# Patient Record
Sex: Female | Born: 1940 | Race: Black or African American | Hispanic: No | Smoking: Former smoker
Health system: Southern US, Community
[De-identification: ages and names within clinical notes are randomized; demographics above are authoritative.]

## PROBLEM LIST (undated history)

## (undated) DIAGNOSIS — I739 Peripheral vascular disease, unspecified: Secondary | ICD-10-CM

## (undated) DIAGNOSIS — Z951 Presence of aortocoronary bypass graft: Secondary | ICD-10-CM

## (undated) DIAGNOSIS — I4891 Unspecified atrial fibrillation: Secondary | ICD-10-CM

## (undated) DIAGNOSIS — E785 Hyperlipidemia, unspecified: Secondary | ICD-10-CM

## (undated) DIAGNOSIS — E119 Type 2 diabetes mellitus without complications: Secondary | ICD-10-CM

## (undated) DIAGNOSIS — M810 Age-related osteoporosis without current pathological fracture: Secondary | ICD-10-CM

## (undated) DIAGNOSIS — N186 End stage renal disease: Secondary | ICD-10-CM

## (undated) DIAGNOSIS — I340 Nonrheumatic mitral (valve) insufficiency: Secondary | ICD-10-CM

## (undated) DIAGNOSIS — I10 Essential (primary) hypertension: Secondary | ICD-10-CM

## (undated) DIAGNOSIS — Z9289 Personal history of other medical treatment: Secondary | ICD-10-CM

## (undated) DIAGNOSIS — I2510 Atherosclerotic heart disease of native coronary artery without angina pectoris: Secondary | ICD-10-CM

## (undated) HISTORY — DX: Hyperlipidemia, unspecified: E78.5

## (undated) HISTORY — DX: Presence of aortocoronary bypass graft: Z95.1

## (undated) HISTORY — DX: Peripheral vascular disease, unspecified: I73.9

## (undated) HISTORY — DX: Nonrheumatic mitral (valve) insufficiency: I34.0

## (undated) HISTORY — DX: Personal history of other medical treatment: Z92.89

## (undated) HISTORY — DX: Type 2 diabetes mellitus without complications: E11.9

## (undated) HISTORY — DX: End stage renal disease: N18.6

## (undated) HISTORY — DX: Unspecified atrial fibrillation: I48.91

## (undated) HISTORY — DX: Essential (primary) hypertension: I10

## (undated) HISTORY — DX: Age-related osteoporosis without current pathological fracture: M81.0

## (undated) HISTORY — DX: Atherosclerotic heart disease of native coronary artery without angina pectoris: I25.10

---

## 1998-06-11 ENCOUNTER — Other Ambulatory Visit: Admission: RE | Admit: 1998-06-11 | Discharge: 1998-06-11 | Payer: Self-pay | Admitting: Internal Medicine

## 2000-06-22 ENCOUNTER — Encounter: Payer: Self-pay | Admitting: Internal Medicine

## 2000-06-22 ENCOUNTER — Encounter: Admission: RE | Admit: 2000-06-22 | Discharge: 2000-06-22 | Payer: Self-pay | Admitting: Internal Medicine

## 2000-12-22 ENCOUNTER — Emergency Department (HOSPITAL_COMMUNITY): Admission: EM | Admit: 2000-12-22 | Discharge: 2000-12-22 | Payer: Self-pay | Admitting: Emergency Medicine

## 2001-06-29 ENCOUNTER — Encounter: Admission: RE | Admit: 2001-06-29 | Discharge: 2001-06-29 | Payer: Self-pay | Admitting: Internal Medicine

## 2001-06-29 ENCOUNTER — Encounter: Payer: Self-pay | Admitting: Internal Medicine

## 2002-07-03 ENCOUNTER — Encounter: Payer: Self-pay | Admitting: Internal Medicine

## 2002-07-03 ENCOUNTER — Encounter: Admission: RE | Admit: 2002-07-03 | Discharge: 2002-07-03 | Payer: Self-pay | Admitting: Internal Medicine

## 2003-07-17 ENCOUNTER — Encounter: Payer: Self-pay | Admitting: Internal Medicine

## 2003-07-17 ENCOUNTER — Encounter: Admission: RE | Admit: 2003-07-17 | Discharge: 2003-07-17 | Payer: Self-pay | Admitting: Internal Medicine

## 2004-09-13 ENCOUNTER — Encounter: Admission: RE | Admit: 2004-09-13 | Discharge: 2004-09-13 | Payer: Self-pay | Admitting: Internal Medicine

## 2005-10-20 ENCOUNTER — Encounter: Admission: RE | Admit: 2005-10-20 | Discharge: 2005-10-20 | Payer: Self-pay | Admitting: Internal Medicine

## 2005-11-14 HISTORY — PX: AV FISTULA PLACEMENT: SHX1204

## 2005-12-19 HISTORY — PX: CARDIAC CATHETERIZATION: SHX172

## 2006-01-18 ENCOUNTER — Inpatient Hospital Stay (HOSPITAL_COMMUNITY): Admission: RE | Admit: 2006-01-18 | Discharge: 2006-02-06 | Payer: Self-pay | Admitting: Surgery

## 2006-01-18 DIAGNOSIS — Z951 Presence of aortocoronary bypass graft: Secondary | ICD-10-CM

## 2006-01-18 HISTORY — PX: CORONARY ARTERY BYPASS GRAFT: SHX141

## 2006-01-18 HISTORY — DX: Presence of aortocoronary bypass graft: Z95.1

## 2006-01-23 ENCOUNTER — Encounter (INDEPENDENT_AMBULATORY_CARE_PROVIDER_SITE_OTHER): Payer: Self-pay | Admitting: *Deleted

## 2006-02-06 ENCOUNTER — Ambulatory Visit: Payer: Self-pay | Admitting: Physical Medicine & Rehabilitation

## 2006-02-06 ENCOUNTER — Inpatient Hospital Stay (HOSPITAL_COMMUNITY)
Admission: RE | Admit: 2006-02-06 | Discharge: 2006-02-15 | Payer: Self-pay | Admitting: Physical Medicine & Rehabilitation

## 2006-03-03 ENCOUNTER — Ambulatory Visit (HOSPITAL_COMMUNITY)
Admission: RE | Admit: 2006-03-03 | Discharge: 2006-03-03 | Payer: Self-pay | Admitting: Physical Medicine & Rehabilitation

## 2006-03-03 ENCOUNTER — Ambulatory Visit
Admission: RE | Admit: 2006-03-03 | Discharge: 2006-03-03 | Payer: Self-pay | Admitting: Physical Medicine & Rehabilitation

## 2006-03-14 ENCOUNTER — Encounter: Admission: RE | Admit: 2006-03-14 | Discharge: 2006-03-14 | Payer: Self-pay | Admitting: Surgery

## 2006-03-28 ENCOUNTER — Encounter
Admission: RE | Admit: 2006-03-28 | Discharge: 2006-06-26 | Payer: Self-pay | Admitting: Physical Medicine & Rehabilitation

## 2006-03-28 ENCOUNTER — Ambulatory Visit: Payer: Self-pay | Admitting: Physical Medicine & Rehabilitation

## 2006-03-30 ENCOUNTER — Encounter (HOSPITAL_COMMUNITY): Admission: RE | Admit: 2006-03-30 | Discharge: 2006-06-28 | Payer: Self-pay | Admitting: Cardiovascular Disease

## 2006-05-10 ENCOUNTER — Ambulatory Visit (HOSPITAL_COMMUNITY): Admission: RE | Admit: 2006-05-10 | Discharge: 2006-05-10 | Payer: Self-pay | Admitting: Vascular Surgery

## 2006-06-20 ENCOUNTER — Encounter: Admission: RE | Admit: 2006-06-20 | Discharge: 2006-07-31 | Payer: Self-pay | Admitting: Internal Medicine

## 2006-06-29 ENCOUNTER — Encounter (HOSPITAL_COMMUNITY): Admission: RE | Admit: 2006-06-29 | Discharge: 2006-08-11 | Payer: Self-pay | Admitting: Cardiovascular Disease

## 2006-08-01 ENCOUNTER — Encounter: Admission: RE | Admit: 2006-08-01 | Discharge: 2006-08-31 | Payer: Self-pay | Admitting: Internal Medicine

## 2006-08-28 ENCOUNTER — Encounter (HOSPITAL_COMMUNITY): Admission: RE | Admit: 2006-08-28 | Discharge: 2006-11-26 | Payer: Self-pay | Admitting: Nephrology

## 2006-10-31 ENCOUNTER — Encounter: Admission: RE | Admit: 2006-10-31 | Discharge: 2006-10-31 | Payer: Self-pay | Admitting: Internal Medicine

## 2006-12-22 ENCOUNTER — Encounter (HOSPITAL_COMMUNITY): Admission: RE | Admit: 2006-12-22 | Discharge: 2007-03-22 | Payer: Self-pay

## 2007-04-13 ENCOUNTER — Encounter (HOSPITAL_COMMUNITY): Admission: RE | Admit: 2007-04-13 | Discharge: 2007-07-12 | Payer: Self-pay | Admitting: Nephrology

## 2007-07-19 ENCOUNTER — Encounter (HOSPITAL_COMMUNITY): Admission: RE | Admit: 2007-07-19 | Discharge: 2007-10-17 | Payer: Self-pay | Admitting: Nephrology

## 2007-11-05 ENCOUNTER — Encounter: Admission: RE | Admit: 2007-11-05 | Discharge: 2007-11-05 | Payer: Self-pay | Admitting: Internal Medicine

## 2007-12-07 ENCOUNTER — Encounter (HOSPITAL_COMMUNITY): Admission: RE | Admit: 2007-12-07 | Discharge: 2008-03-06 | Payer: Self-pay | Admitting: Nephrology

## 2007-12-24 ENCOUNTER — Ambulatory Visit: Payer: Self-pay | Admitting: Surgery

## 2008-01-04 IMAGING — CT CT HEAD W/O CM
1 of 2 series · 13 of 30 positions shown, 17 images · IV contrast (agent unspecified)
Comparison: 01/20/2006.

CLINICAL DATA: Stroke.  
 HEAD CT WITHOUT CONTRAST:
TECHNIQUE: Contiguous axial images were obtained from the base of the skull through the vertex according to standard protocol without contrast.

[Series 2: brain · axial · 0.47mm/px · z∈[+129,+253]mm · 13 of 28 slices shown, 17 images]
[im 2/28  brain]
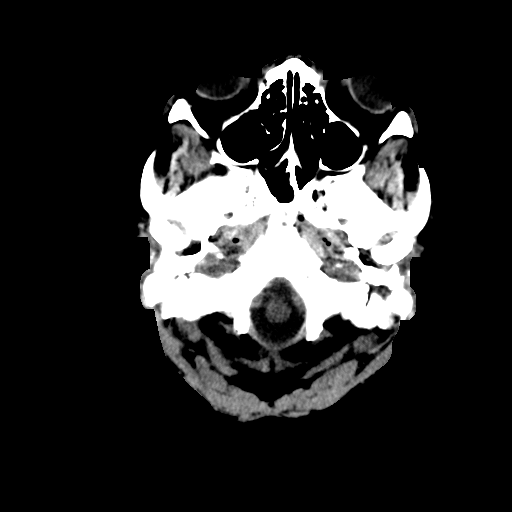
[im 2/28  bone]
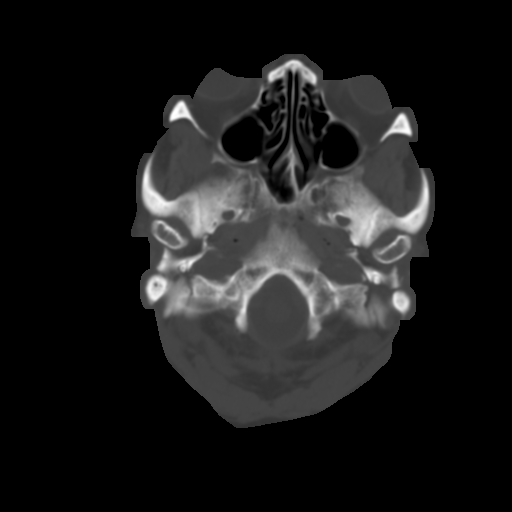
[im 4/28  brain]
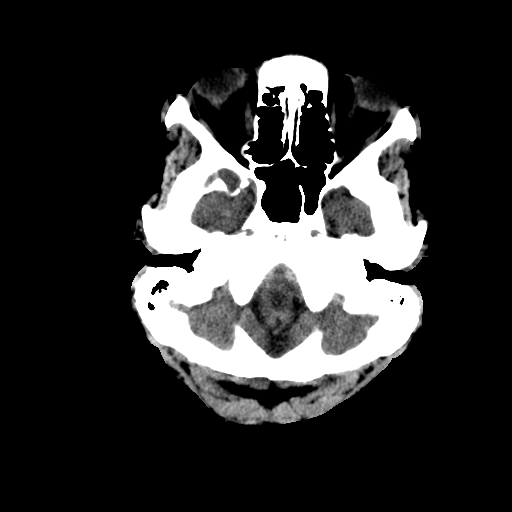
[im 6/28  brain]
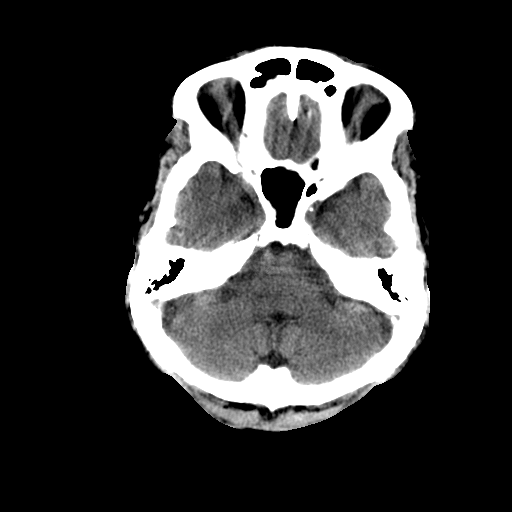
[im 8/28  brain]
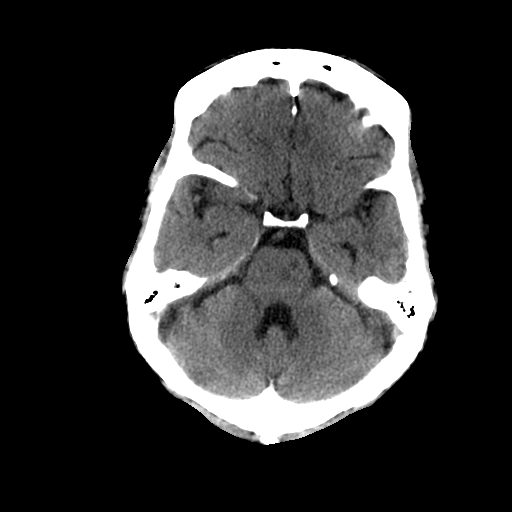
[im 10/28  brain]
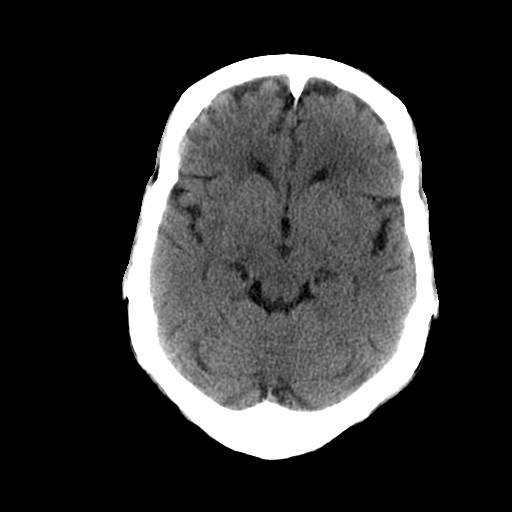
[im 10/28  bone]
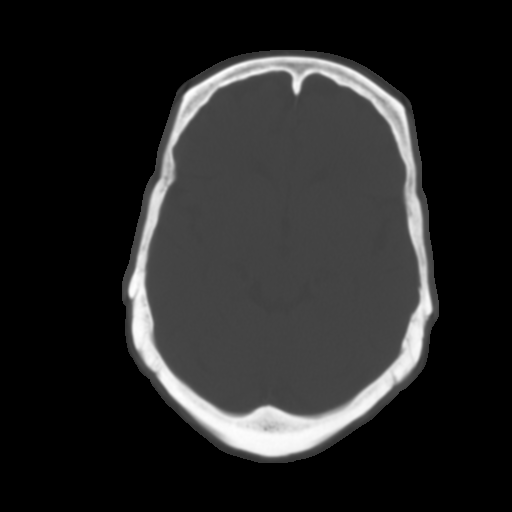
[im 12/28  brain]
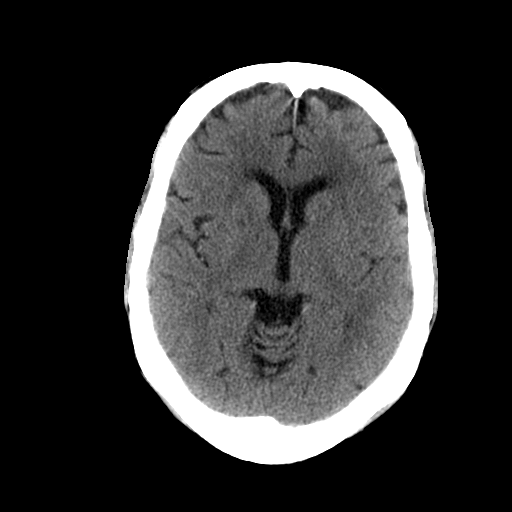
[im 14/28  brain]
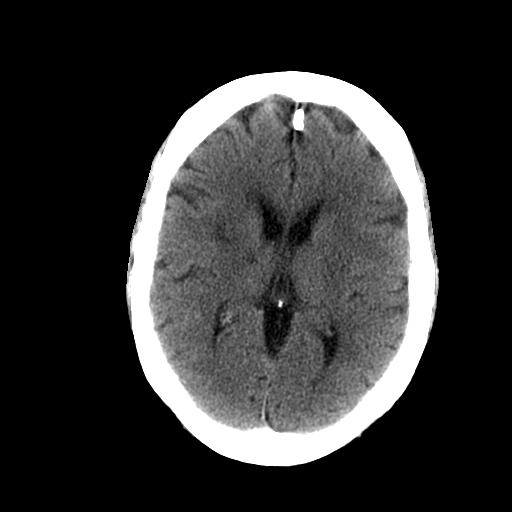
[im 16/28  brain]
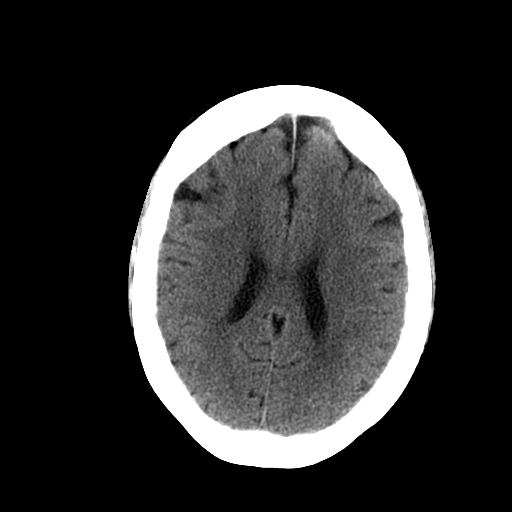
[im 18/28  brain]
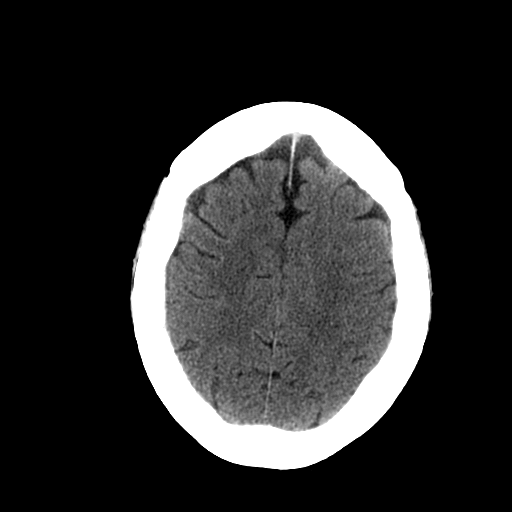
[im 18/28  bone]
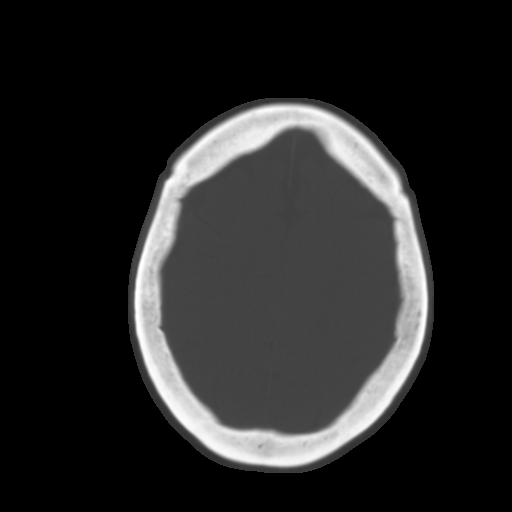
[im 20/28  brain]
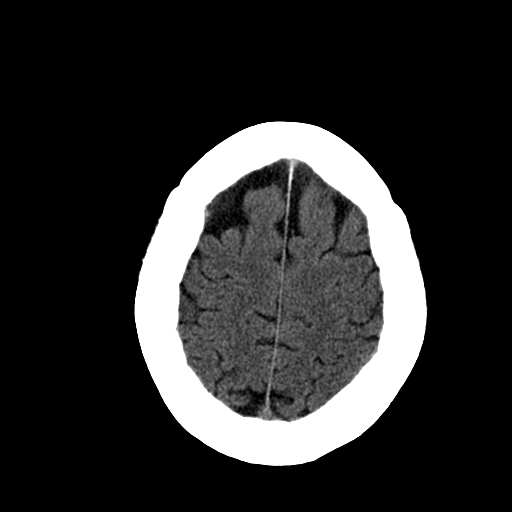
[im 22/28  brain]
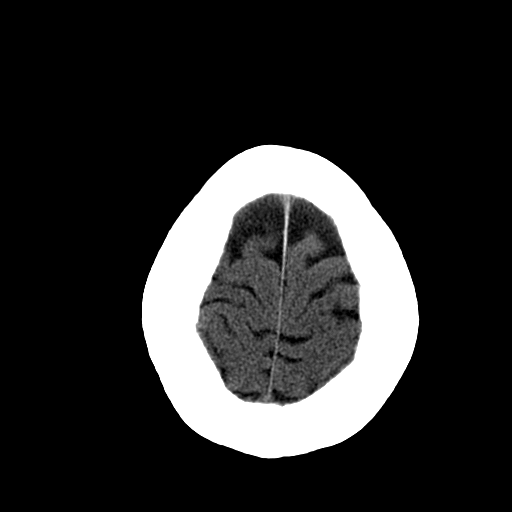
[im 24/28  brain]
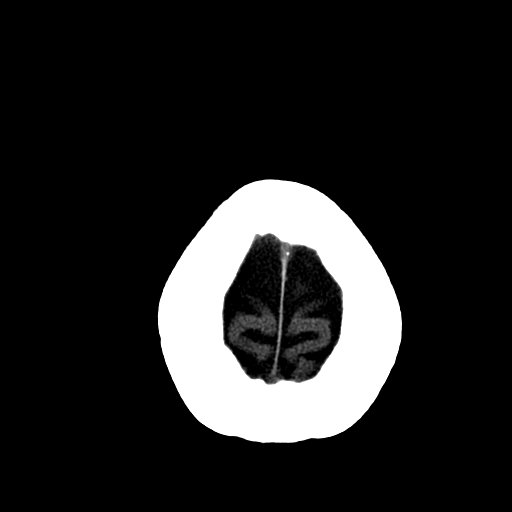
[im 26/28  brain]
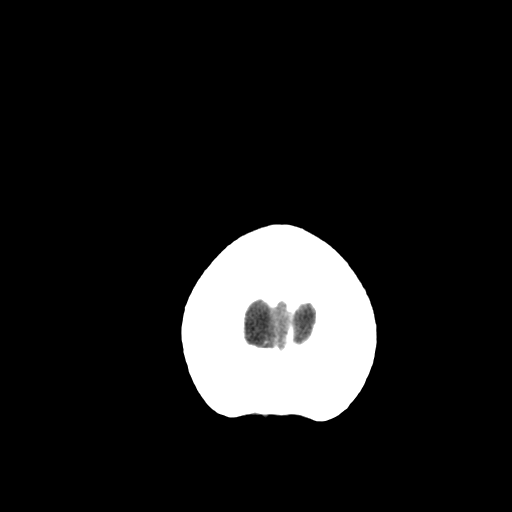
[im 26/28  bone]
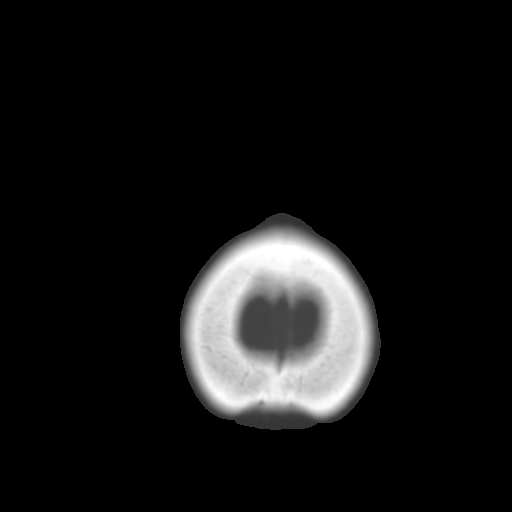

[13 of 30 positions shown; findings below may reference images not displayed]

Chronic small vessel ischemic disease changes are again appreciated.  Remote-appearing lacunar infarctions left pons, right basal ganglia and right posterior frontal white matter (corona radiata).  No acute interval changes.  No hemorrhage.  Atrophic changes mainly involving frontal lobes.
IMPRESSION: Chronic small vessel ischemic disease changes of the cerebral white matter, right basal ganglia and pons.

## 2008-01-09 ENCOUNTER — Ambulatory Visit: Payer: Self-pay | Admitting: Surgery

## 2008-01-09 ENCOUNTER — Ambulatory Visit (HOSPITAL_COMMUNITY): Admission: RE | Admit: 2008-01-09 | Discharge: 2008-01-09 | Payer: Self-pay | Admitting: Surgery

## 2008-01-12 IMAGING — CR DG CHEST 2V
2 series · 2 of 2 positions shown · non-contrast
Comparison: 01/25/06.

CLINICAL DATA: Postop from coronary artery bypass grafting.  Follow up pulmonary edema.
 CHEST ? 2 VIEW:

[w chest pa]
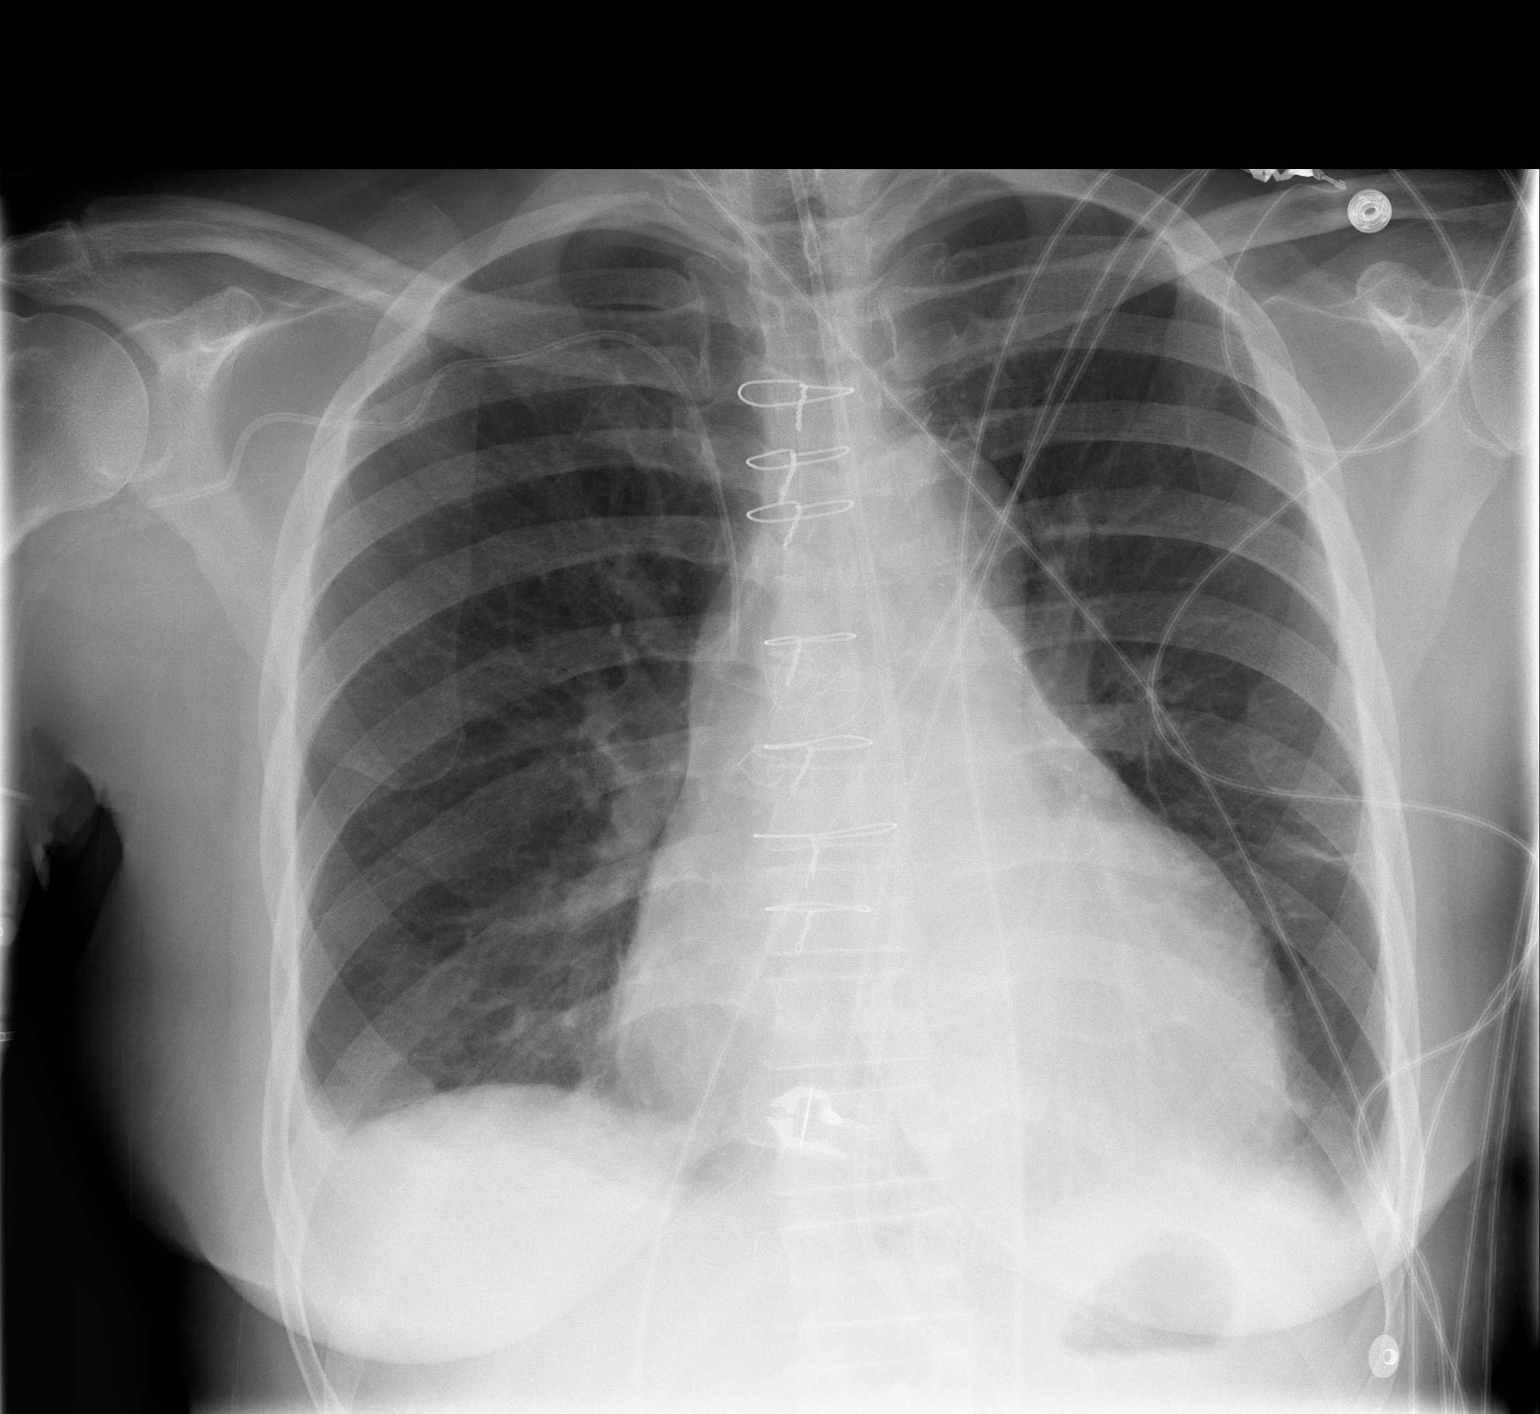

[w chest lat]
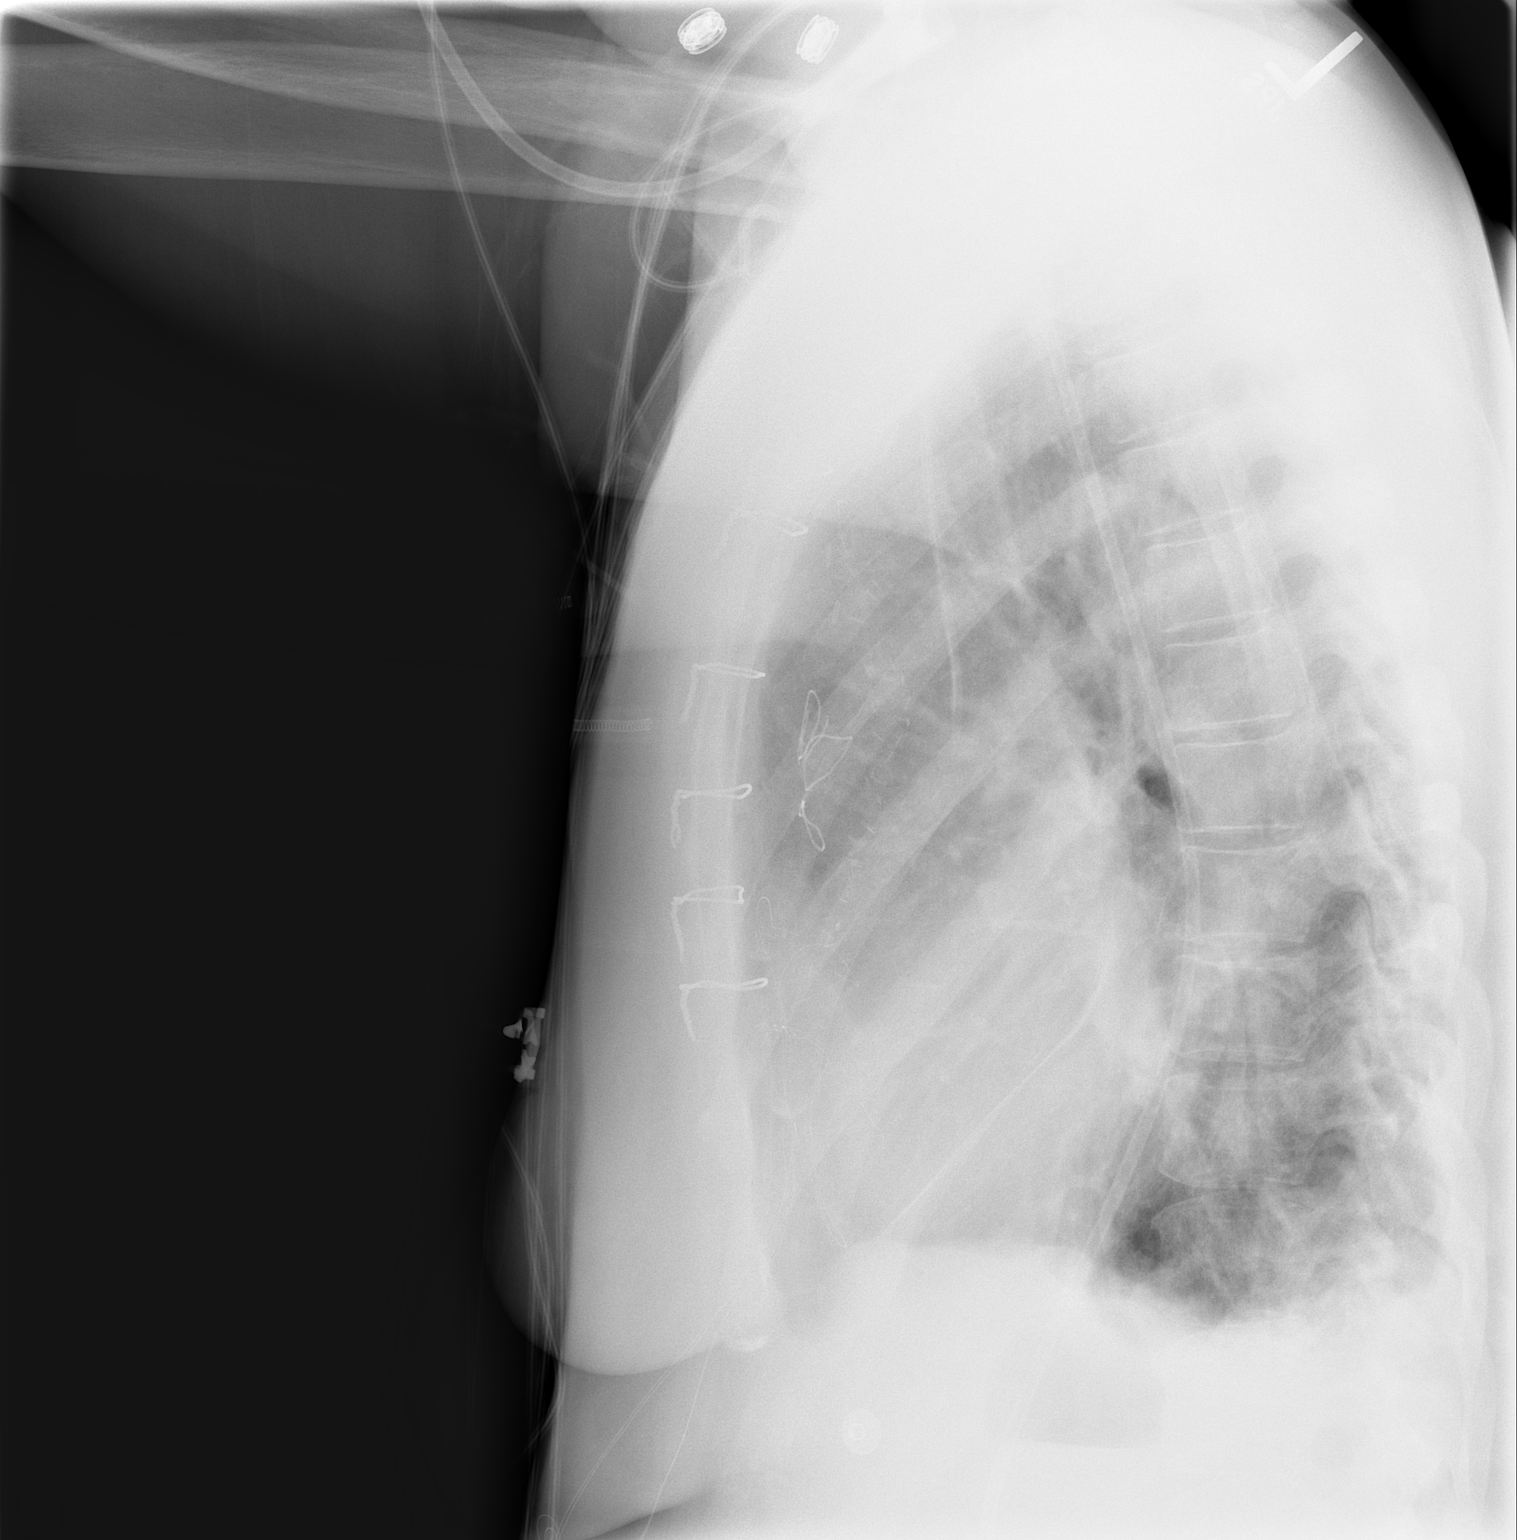

[2 of 2 positions shown; findings below may reference images not displayed]

FINDINGS: Cardiomegaly is stable.  Decreased atelectasis and pleural effusions are seen in both lung bases.  No new or worsening areas of pulmonary opacity are seen.  The patient has undergone coronary artery bypass grafting.
IMPRESSION: Decreased bibasilar atelectasis and bilateral pleural effusions since prior study.  Stable cardiomegaly.

## 2008-01-28 ENCOUNTER — Ambulatory Visit: Payer: Self-pay | Admitting: Surgery

## 2008-04-25 ENCOUNTER — Encounter (HOSPITAL_COMMUNITY): Admission: RE | Admit: 2008-04-25 | Discharge: 2008-07-24 | Payer: Self-pay | Admitting: Nephrology

## 2008-08-15 ENCOUNTER — Encounter (HOSPITAL_COMMUNITY): Admission: RE | Admit: 2008-08-15 | Discharge: 2008-10-22 | Payer: Self-pay | Admitting: Nephrology

## 2008-11-05 ENCOUNTER — Encounter: Admission: RE | Admit: 2008-11-05 | Discharge: 2008-11-05 | Payer: Self-pay | Admitting: Internal Medicine

## 2008-12-08 ENCOUNTER — Encounter (HOSPITAL_COMMUNITY): Admission: RE | Admit: 2008-12-08 | Discharge: 2009-03-08 | Payer: Self-pay | Admitting: Nephrology

## 2009-02-24 HISTORY — PX: OTHER SURGICAL HISTORY: SHX169

## 2009-03-30 ENCOUNTER — Encounter (HOSPITAL_COMMUNITY): Admission: RE | Admit: 2009-03-30 | Discharge: 2009-06-28 | Payer: Self-pay | Admitting: Nephrology

## 2009-07-21 ENCOUNTER — Encounter (HOSPITAL_COMMUNITY): Admission: RE | Admit: 2009-07-21 | Discharge: 2009-10-19 | Payer: Self-pay | Admitting: Nephrology

## 2009-09-21 ENCOUNTER — Encounter (HOSPITAL_COMMUNITY): Admission: RE | Admit: 2009-09-21 | Discharge: 2009-09-22 | Payer: Self-pay | Admitting: Nephrology

## 2009-11-17 ENCOUNTER — Encounter: Admission: RE | Admit: 2009-11-17 | Discharge: 2009-11-17 | Payer: Self-pay | Admitting: Internal Medicine

## 2009-12-30 ENCOUNTER — Encounter (HOSPITAL_COMMUNITY): Admission: RE | Admit: 2009-12-30 | Discharge: 2010-03-30 | Payer: Self-pay | Admitting: Nephrology

## 2010-05-07 ENCOUNTER — Encounter (HOSPITAL_COMMUNITY)
Admission: RE | Admit: 2010-05-07 | Discharge: 2010-08-05 | Payer: Self-pay | Source: Home / Self Care | Admitting: *Deleted

## 2010-07-16 ENCOUNTER — Ambulatory Visit (HOSPITAL_COMMUNITY): Admission: RE | Admit: 2010-07-16 | Discharge: 2010-07-16 | Payer: Self-pay | Admitting: Nephrology

## 2010-11-24 ENCOUNTER — Encounter
Admission: RE | Admit: 2010-11-24 | Discharge: 2010-11-24 | Payer: Self-pay | Source: Home / Self Care | Attending: Internal Medicine | Admitting: Internal Medicine

## 2010-12-15 HISTORY — PX: TRANSTHORACIC ECHOCARDIOGRAM: SHX275

## 2011-01-30 LAB — POCT HEMOGLOBIN-HEMACUE: Hemoglobin: 10 g/dL — ABNORMAL LOW (ref 12.0–15.0)

## 2011-02-01 LAB — IRON AND TIBC
Iron: 62 ug/dL (ref 42–135)
Saturation Ratios: 29 % (ref 20–55)
TIBC: 216 ug/dL — ABNORMAL LOW (ref 250–470)
UIBC: 154 ug/dL

## 2011-02-01 LAB — RENAL FUNCTION PANEL
Albumin: 3.2 g/dL — ABNORMAL LOW (ref 3.5–5.2)
BUN: 81 mg/dL — ABNORMAL HIGH (ref 6–23)
Calcium: 8.6 mg/dL (ref 8.4–10.5)
Creatinine, Ser: 5.2 mg/dL — ABNORMAL HIGH (ref 0.4–1.2)
Glucose, Bld: 131 mg/dL — ABNORMAL HIGH (ref 70–99)
Phosphorus: 4.4 mg/dL (ref 2.3–4.6)

## 2011-02-01 LAB — POCT HEMOGLOBIN-HEMACUE: Hemoglobin: 11 g/dL — ABNORMAL LOW (ref 12.0–15.0)

## 2011-02-02 LAB — CBC
MCHC: 33.8 g/dL (ref 30.0–36.0)
MCV: 94.1 fL (ref 78.0–100.0)
Platelets: 128 10*3/uL — ABNORMAL LOW (ref 150–400)
RBC: 3.89 MIL/uL (ref 3.87–5.11)
RDW: 15.9 % — ABNORMAL HIGH (ref 11.5–15.5)

## 2011-02-02 LAB — PTH, INTACT AND CALCIUM
Calcium, Total (PTH): 8.4 mg/dL (ref 8.4–10.5)
PTH: 630.5 pg/mL — ABNORMAL HIGH (ref 14.0–72.0)

## 2011-02-02 LAB — COMPREHENSIVE METABOLIC PANEL
AST: 23 U/L (ref 0–37)
CO2: 20 mEq/L (ref 19–32)
Calcium: 8.4 mg/dL (ref 8.4–10.5)
Creatinine, Ser: 4.68 mg/dL — ABNORMAL HIGH (ref 0.4–1.2)
GFR calc Af Amer: 11 mL/min — ABNORMAL LOW (ref >60)
GFR calc non Af Amer: 9 mL/min — ABNORMAL LOW (ref >60)
Total Protein: 7.1 g/dL (ref 6.0–8.3)

## 2011-02-02 LAB — RENAL FUNCTION PANEL
BUN: 68 mg/dL — ABNORMAL HIGH (ref 6–23)
Chloride: 108 mEq/L (ref 96–112)
Glucose, Bld: 235 mg/dL — ABNORMAL HIGH (ref 70–99)
Potassium: 4.3 mEq/L (ref 3.5–5.1)

## 2011-02-02 LAB — IRON AND TIBC
Iron: 96 ug/dL (ref 42–135)
Saturation Ratios: 36 % (ref 20–55)

## 2011-02-16 LAB — POCT HEMOGLOBIN-HEMACUE: Hemoglobin: 12 g/dL (ref 12.0–15.0)

## 2011-02-16 LAB — IRON AND TIBC
Iron: 72 ug/dL (ref 42–135)
Saturation Ratios: 28 % (ref 20–55)
UIBC: 182 ug/dL

## 2011-02-18 LAB — RENAL FUNCTION PANEL
CO2: 20 mEq/L (ref 19–32)
Chloride: 104 mEq/L (ref 96–112)
GFR calc Af Amer: 13 mL/min — ABNORMAL LOW (ref >60)
GFR calc non Af Amer: 11 mL/min — ABNORMAL LOW (ref >60)
Potassium: 3.7 mEq/L (ref 3.5–5.1)
Sodium: 134 mEq/L — ABNORMAL LOW (ref 135–145)

## 2011-02-20 LAB — POCT HEMOGLOBIN-HEMACUE: Hemoglobin: 11.9 g/dL — ABNORMAL LOW (ref 12.0–15.0)

## 2011-02-22 LAB — IRON AND TIBC
Iron: 68 ug/dL (ref 42–135)
Saturation Ratios: 29 % (ref 20–55)
TIBC: 233 ug/dL — ABNORMAL LOW (ref 250–470)

## 2011-02-22 LAB — RENAL FUNCTION PANEL
CO2: 19 mEq/L (ref 19–32)
Calcium: 8.8 mg/dL (ref 8.4–10.5)
Creatinine, Ser: 3.92 mg/dL — ABNORMAL HIGH (ref 0.4–1.2)
Glucose, Bld: 222 mg/dL — ABNORMAL HIGH (ref 70–99)
Sodium: 135 mEq/L (ref 135–145)

## 2011-02-24 LAB — FERRITIN: Ferritin: 1636 ng/mL — ABNORMAL HIGH (ref 10–291)

## 2011-02-24 LAB — IRON AND TIBC: UIBC: 170 ug/dL

## 2011-02-24 LAB — POCT HEMOGLOBIN-HEMACUE: Hemoglobin: 10.9 g/dL — ABNORMAL LOW (ref 12.0–15.0)

## 2011-02-28 LAB — RENAL FUNCTION PANEL
BUN: 60 mg/dL — ABNORMAL HIGH (ref 6–23)
CO2: 21 mEq/L (ref 19–32)
Chloride: 107 mEq/L (ref 96–112)
Creatinine, Ser: 3.77 mg/dL — ABNORMAL HIGH (ref 0.4–1.2)
Potassium: 4.4 mEq/L (ref 3.5–5.1)

## 2011-02-28 LAB — IRON AND TIBC
Iron: 49 ug/dL (ref 42–135)
TIBC: 224 ug/dL — ABNORMAL LOW (ref 250–470)

## 2011-03-29 NOTE — Op Note (Signed)
NAMEANASTYN, AYARS          ACCOUNT NO.:  [id number]   MEDICAL RECORD NO.:  [id number]          PATIENT TYPE:  AMB   LOCATION:  SDS                          FACILITY:  [facility]   PHYSICIAN:  Juleen China IV, MDDATE OF BIRTH:  1940/11/20   DATE OF PROCEDURE:  01/09/2008  DATE OF DISCHARGE:  01/09/2008                               OPERATIVE REPORT   PREOPERATIVE DIAGNOSIS:  Chronic renal insufficiency.   POSTOPERATIVE DIAGNOSES:  Chronic renal insufficiency and nonmaturing  left arm arteriovenous fistula.   PROCEDURES:  Ligation of vein branch x2.   ANESTHESIA:  MAC.   DRAINS:  None.   CULTURES:  None.   SPECIMENS:  None.   PROCEDURE:  The patient was identified in the holding area and taken to  room 6.  She was placed supine on the table.  The left arm was prepped  and draped in standard sterile fashion.  MAC anesthesia was  administered.  Timeout was called and antibiotics were given.  Using  ultrasound, the 2 vein branches were identified and marked with an ink  pen.  Lidocaine 1% was used for local anesthesia.  First, a horizontal  incision was made just above the antecubital fossa where the largest of  the two branches existed.  The vein was readily identified.  It was  encircled with a right angle.  Two 2-0 silk ties  were used to ligate  the vein.  The deep tissue was reapproximated with 3-0 Vicryl and the  skin was closed with 4-0 Vicryl.  Next, a longitudinal incision was made  parallel to the lateral aspect of the fistula in the upper arm where the  second branch was.  In order to identify this branch, I had to  skeletonize the cephalic vein for distance of approximately 1 cm.  This  enabled me to identify the vein branch.  This also was ligated withtwo 2-  0 silk ties.  Following ligation of the 2 branches, there was improved  flow through the fistula and a palpable thrill to the level of the  shoulder.  Again, deep tissues were closed with 3-0 Vicryl.   Skin was  closed with 4-0 Vicryl.  Dermabond was placed.  The patient tolerated  the procedure well.  There were no complications.           ______________________________  V. Charlena Cross, MD  Electronically Signed     VWB/MEDQ  D:  01/09/2008  T:  01/09/2008  Job:  [id number]

## 2011-03-29 NOTE — Assessment & Plan Note (Signed)
OFFICE VISIT   ASHANI, PUMPHREY  DOB:  [birth year]                                       12/24/2007  [id number]#:[id number]   REASON FOR VISIT:  Evaluate nonfunctioning AV fistula.   HISTORY:  This is a 70 year old female who has undergone left upper arm  fistula by Dr. Madilyn Fireman in June of 2007.  She was last seen by Dr. Caryn Section and  it was deemed to be unusable at this point.  She comes in now to have  this re-evaluated.  It has never been used.  She is not on dialysis.   PHYSICAL EXAMINATION:  She is alert and oriented and in no acute  distress.  Left arm fistula reveals good thrill approximately 3 cm up  from her incision.  After that it is difficult to feel.   The patient was duplexed today.  There were two branches coming off the  cephalic vein, one at the antecubital fossa and one in the mid to upper  arm.   ASSESSMENT:  Nonmaturing left arm fistula.   PLAN:  The patient has two branches that I would like to ligate.  I have  scheduled her to have this done in 2 weeks.  The patient also needs a  dental procedure done and is trying to coordinate getting both of these  procedures done on the same day.  I told her I do not think that this is  likely to happen but if she could arrange it I would be amenable to  doing so.  As of now she is scheduled for ligation of two branches off  the cephalic vein fistula in 2 weeks.   Jorge Ny, MD  Electronically Signed   VWB/MEDQ  D:  12/24/2007  T:  12/26/2007  Job:  [id number]   cc:   Wilber Bihari. Caryn Section, M.D.

## 2011-03-29 NOTE — Procedures (Signed)
VASCULAR LAB EXAM   INDICATION:  Questionable AV fistula steal.  Patient with history of end-  stage renal disease.   HISTORY:  Diabetes:  Yes.  Cardiac:  Coronary artery disease.  Hypertension:  Yes.   EXAM:  Left cephalic vein and AV fistula duplex.   IMPRESSION:  Two branches noted of the left cephalic vein at the  antecubital fossa in the upper arm.   ___________________________________________  V. Charlena Cross, MD   MG/MEDQ  D:  12/24/2007  T:  12/25/2007  Job:  [id number]

## 2011-03-29 NOTE — Assessment & Plan Note (Signed)
OFFICE VISIT   Megan Vasquez, Megan Vasquez  DOB:  1941/01/06                                       01/28/2008  [id number]#:[id number]   REASON FOR VISIT:  Followup.   HISTORY:  This is a 70 year old female who has undergone a left upper  arm fistula by Dr. Madilyn Fireman in June 2007.  Dr. Caryn Section recently saw her and  felt that this was un-useful, and I reevaluated her and ultrasound was  found that she has 2 large branches.  Therefore, on 02/25 she underwent  ligation of 2 branches and she comes back in today for followup.   PHYSICAL EXAMINATION:  Blood pressure 146/58, pulse 63, respirations 16.  She is well-appearing.  In no acute distress.  Her left arm fistula has  a very good thrill all the way up throughout her arm.  This is  significantly improved following ligation of her branches.   ASSESSMENT AND PLAN:  A 70 year old with chronic kidney disease, not yet  on dialysis who has recently undergone ligation of 2 fistula branches on  her left cephalic vein fistula.  I feel the fistula is much improved and  should be able to be used if she ever requires dialysis.   Jorge Ny, MD  Electronically Signed   VWB/MEDQ  D:  01/28/2008  T:  01/29/2008  Job:  [id number]   cc:   Wilber Bihari. Caryn Section, M.D.

## 2011-04-01 NOTE — Consult Note (Signed)
NAMENAVIE, LAMOREAUX NO.:  [id number]   MEDICAL RECORD NO.:  [id number]          PATIENT TYPE:  INP   LOCATION:  2313                         FACILITY:  [facility]   PHYSICIAN:  Bevelyn Buckles. Champey, M.D.DATE OF BIRTH:  January 18, 1941   DATE OF CONSULTATION:  01/20/2006  DATE OF DISCHARGE:                                   CONSULTATION   REASON FOR CONSULTATION:  Stroke.   HISTORY OF PRESENT ILLNESS:  Ms. Uber is a 70 year old African American  female with a past medical history of hypertension, diabetes,  hypothyroidism, chronic renal insufficiency, who presented initially for  coronary artery disease and for coronary artery bypass graft surgery. The  patient had CABG times three two days ago and since the surgery the patient  has been less responsive with minimal interaction with the nurse and MDs and  no verbalization and not following commands. The patient did have a CT of  the brain that showed a right basal ganglial infarct of which is  indeterminate. It seems that the patient occasionally tries to answer  questions, however cannot get words out. The patient cannot give any further  history, so the history is limited to the chart and nurse. It did seem like  the patient had an elevated blood pressure during the procedure.   PAST MEDICAL HISTORY:  1.  Hypertension.  2.  Diabetes.  3.  Coronary artery disease, status post coronary artery bypass graft.   CURRENT MEDICATIONS:  Lopressor, Protonix, Synthroid, Lantus, and aspirin.   ALLERGIES:  The patient has no known drug allergies.   FAMILY HISTORY:  Positive for stroke, heart disease, and diabetes.   SOCIAL HISTORY:  The patient lives with her grandson. She smoked 20-pack  years.   REVIEW OF SYSTEMS:  Unable to obtain secondary to the patient's mental  status.   PHYSICAL EXAMINATION:  VITAL SIGNS: Temperature 99.9, blood pressure 160/55,  pulse 100, respirations 20, O2 saturation is 98% on two liters.  HEENT: Normocephalic and atraumatic. Extraocular movements intact. Pupils  equal, round, and reactive. The patient does resist eye opening. The patient  also resists opening the mouth.  NECK: Supple with no carotid bruits.  CHEST: Dressed secondary to surgery.  HEART: Regular.  LUNGS: Clear.  ABDOMEN: Soft.  EXTREMITIES: Possible decreased tone on the left. No edema with good pulses.  NEUROLOGIC: The patient is awake, opens eyes, tracks, and looks when spoken  to occasionally. The patient also tries to verbalize and does not seem to be  able to get words out. There is no verbalization from the patient. Cranial  nerves--the face looks symmetric. Extraocular muscles are intact. Pupils are  equal and reactive. On motor examination, the patient has no spontaneous  movement in the lower extremities, however she seems to move her right  focally more than the left with stimulation. On sensory examination, the  patient withdraws to stimulus in all four extremities, possibly more so on  the right than when compared to the left. Reflexes are 1 to 3+ throughout.  Toes are neutral bilaterally. Cerebellar and gait are unable to assess at  this time secondary to  the patient's mental status.   CT of the head was reviewed and showed a right basal ganglia infarct, age  indeterminate, with no bleed.   LABORATORY DATA:  WBC 12.3, hematocrit 8.1, hemoglobin 24.0, platelet count  99,000. Sodium 144, potassium 4.0, sodium 112, CO2 25, BUN 48, creatinine  4.3, glucose 133, calcium 7.5, phosphorus 4.8, magnesium 2.7.   IMPRESSION:  A 70 year old African American female, status post coronary  artery bypass graft with altered mental status, speech difficulty, and  questionable decreased movement on the left. With the patient's procedure,  elevated blood pressure, and increased risk factors, concern for possible  stroke. Will check an MRI and MRA of the brain (if possible), carotid  Dopplers, and 2-D  echocardiogram. Will check lipids and homocysteine level.  The patient's altered mental status could also be secondary to her renal  insufficiency. Will check an EEG for her altered mental status. We will  continue her aspirin and current medications at this time. She will also  rule out an infectious process and correct electrolyte abnormalities  optimally. We will follow the patient.      Bevelyn Buckles. Nash Shearer, M.D.  Electronically Signed     DRC/MEDQ  D:  01/20/2006  T:  01/22/2006  Job:  [id number]

## 2011-04-01 NOTE — H&P (Signed)
NAMEMarland Vasquez  KAYDYN, CHISM NO.:  [id number]   MEDICAL RECORD NO.:  [id number]          PATIENT TYPE:  IPS   LOCATION:  4037                         FACILITY:  [facility]   PHYSICIAN:  Erick Colace, M.D.DATE OF BIRTH:  03-08-1941   DATE OF ADMISSION:  02/06/2006  DATE OF DISCHARGE:                                HISTORY & PHYSICAL   REASON FOR ADMISSION:  Decline in self care and mobility skills following  stroke.   HISTORY:  A 70 year old female with prior history of hypertension, diabetes,  and chronic renal insufficiency as well as coronary artery disease was  admitted January 18, 2006, for CABG x4 per Dr. Laneta Simmers on January 20, 2006.  She  presented with aphasia and difficulty following commands.  CT of the brain  showed a basal ganglia infarct.  EEG showed diffuse slowing, questionable  encephalopathy or dementia.  Cardiac echocardiogram showed ejection fraction  of 50 to 55% with small left pleural effusion and mild increase in aortic  valve thickness.  Dr. Thad Ranger consulted for his input.  The patient had  postoperative atrial fibrillation and was placed on Coumadin for presumed  cardioembolic stroke.  [facility] were following  patient for atrial fibrillation, and she was chemically converted with IV  Cardizem and a beta blocker.  She was also noted to have dysphagia, and  followup modified barium swallow showed improvement but still requiring a  modified diet.   She had problems with fluid overload and was treated with diuretics for  acute on chronic renal failure.  Her creatinine stabilized at 4.3, and there  was question of A-V graft in the future.  She has been mobilized by physical  therapy on the acute care side but plateaued at a minimum assistance level.  Therefore, on conjunction with certain medical complications, felt to be a  good rehab candidate.   REVIEW OF SYSTEMS:  Positive for lower extremity swelling, left lower  extremity as well as chest wounds with some postoperative discomfort and  weakness, particularly right lower extremity.   PAST MEDICAL HISTORY:  In addition to above:  1.  Dyslipidemia.  2.  Type 2 diabetes.  3.  Diabetic neuropathy.  4.  Osteoarthritis.  5.  Hypothyroidism.   FAMILY HISTORY:  CAD, CVA, and diabetes.   SOCIAL HISTORY:  Lives alone in 2-level home, 3 steps to enter, 1 flight to  get to bathroom.  She was working prior to admission.   MEDICATIONS:  1.  Lantus 16 units subcutaneously q.p.m.  2.  Neurontin 300 mg p.o. daily.  3.  Aspirin daily.  4.  Iron supplement daily.  5.  Lasix 20 mg p.o. daily.  6.  Avapro 300 mg p.o. daily.  7.  Cardizem 300 mg p.o. daily.  8.  Synthroid 150 mcg daily.   Last INR 4.  CBGs run in the 51  to 220 range last 72 hours.  Last BUN on  March 22 was 97 and creatinine 4.3.  Her last hemoglobin was 9.1.  Hemoglobin A1c 5.3.   PHYSICAL EXAMINATION:  VITAL SIGNS:  Blood pressure 123/70, pulse 57,  temperature 97.7, respirations 16.  Weight 171 pounds.  GENERAL:  No acute distress.  HEENT:  Eyes anicteric.  Eyes not injected, mild icterus.  External ENT  normal except for poor dentition.  NECK:  Supple without adenopathy.  LUNGS:  Bilateral rhonchi.  HEART:  Positive systolic ejection murmur.  Pedal pulses normal.  She has  edema left lower extremity greater than right lower extremity 2+ on the  left, 1+ right pretibial area.  ABDOMEN:  Positive bowel sounds.  Soft, nontender.  NEUROLOGIC: Strength and tone is reduced in right lower extremity.  Sensation reduced right lower extremity versus left.  Mood and affect flat.  Orientation is x3.  On exam was 4+/5 bilateral deltoid, biceps, triceps,  grip. She has 4/5 strength in the hip flexor and knee extensor bilaterally,  2- in the right ankle dorsiflexor, 4 at the left ankle dorsiflexor.   IMPRESSION:  1.  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  2.  Acute on chronic renal insufficiency.  She is on reduced dose Lasix.      She has had a tenuous fluid status.  3.  Paroxysmal atrial fibrillation on Coumadin with supratherapeutic INR.      Controlling rate with amiodarone, Toprol XL, and Cardizem.  4.  Diabetes, type 2, resulting in uncontrolled CBGs.  Continue further      adjustments.  5.  Anemia of chronic disease.  Continue Aranesp and iron supplements.  6.  Hypothyroidism on supplements.   PLAN:  1.  We will ask renal to follow along.  2.  Continue PT on a more intensive level to upgrade functional mobility.  3.  Continue OT to increase ADLs comprehensive inpatient rehabilitation      level.  4.  Continue comprehensive speech therapy for her dysphagia and monitor her      intake for signs and symptoms of aspiration.  5.  Rehab nursing will monitor her CBGs, her wound sites, as well as her      edema and respiratory status.  6.  Twenty-four-hour rehab physician.   PROGNOSIS:  Good to achieve modified independent level to intermittent  supervision level.      Erick Colace, M.D.  Electronically Signed     AEK/MEDQ  D:  02/06/2006  T:  02/07/2006  Job:  [id number]   cc:   Candyce Churn. Allyne Gee, M.D.  Fax: [phone]   Wilber Bihari. Caryn Section, M.D.  Fax: [phone]   Nanetta Batty, M.D.  Fax: [phone]   Evelene Croon, M.D.  [address]  [place]  [place] [address]   Ellwood Dense, M.D.  Fax: [phone]

## 2011-04-01 NOTE — Procedures (Signed)
EEG NUMBER:  [id number]   DATE OF PROCEDURE:  January 23, 2006   REQUESTING PHYSICIAN:  Dr. Deneen Harts   ATTENDING PHYSICIAN:  Dr. Evelene Croon   CLINICAL HISTORY:  This is a portable EEG done at the bedside without photic  stimulation or hyperventilation.  The patient is described as awake.  This  is a 70 year old woman with history of confusion.  EEG is performed for  evaluation.   DESCRIPTION:  The dominant rhythm of this tracing is a moderate amplitude  theta rhythm of 6-7 Hz which appears diffusely without abnormal asymmetry,  some low amplitude fast activity is seen over frontal regions without  abnormal asymmetry.  No focal slowing is noted.  No epileptiform discharges  are seen.  No sleep architecture is noted.  Photic stimulation and  hyperventilation are not performed.  Single channel devoted to EKG reveals  sinus rhythm throughout with a rate of approximately 78 beats per minute.   CONCLUSIONS:  Abnormal study due to the presence of moderate generalized  slowing of background rhythms, a finding suggestive of diffuse widespread  cerebral dysfunction and consistent with a given history of encephalopathic  and/or demented state.  No focal slowing is noted and no epileptiform  discharges are seen.      Michael L. Thad Ranger, M.D.  Electronically Signed     ZOX:WRUE  D:  01/24/2006 00:07:34  T:  01/24/2006 20:52:46  Job #:  [id number]

## 2011-04-01 NOTE — Discharge Summary (Signed)
Megan Vasquez, Megan Vasquez NO.:  [id number]   MEDICAL RECORD NO.:  [id number]          PATIENT TYPE:  INP   LOCATION:  2034                         FACILITY:  [facility]   PHYSICIAN:  Evelene Croon, M.D.     DATE OF BIRTH:  [birth year]   DATE OF ADMISSION:  01/18/2006  DATE OF DISCHARGE:                                 DISCHARGE SUMMARY   ADMISSION DIAGNOSIS:  Coronary artery disease.   DISCHARGE DIAGNOSES:  1.  Coronary artery disease status post coronary artery bypass grafting.  2.  Perioperative cerebrovascular accident.  3.  Postoperative atrial fibrillation.  4.  Chronic kidney disease.  5.  Diabetes mellitus, type 2, on insulin.  6.  Hypothyroidism.  7.  Hypertension.  8.  Hyperlipidemia.  9.  Anemia.   SERVICE:  CVTS.   ATTENDING PHYSICIAN:  Evelene Croon, M.D.   CONSULTATIONS:  1.  On January 20, 2006, Dr. Deneen Harts was consulted for stroke team.  2.  On January 18, 2006, Dr. Primitivo Gauze, nephrology, was consulted for      patient's chronic kidney disease.  3.  ON January 20, 2006, speech was consulted.   PROCEDURES:  1.  On January 18, 2006, the patient underwent median sternotomy,      extracorporeal circulation, coronary artery bypass graft surgery using      left internal mammary artery graft to the left anterior descending      coronary artery, saphenous vein graft to the diagonal branch of the LAD,      saphenous vein graft to the obtuse marginal branch of the left      circumflex coronary, supraventricular tachycardia to the distal right      coronary artery.  Vein harvesting from the left leg.  Performed by Evelene Croon, M.D.  2.  On January 23, 2006, the patient underwent a portable EEG by Dr. Jacki Cones.  3.  On January 30, 2006, the patient underwent a swallowing study.   HISTORY OF PRESENT ILLNESS:  The patient is a 70 year old African-American  female with a long history of severe hypertension, diabetes.  The patient  saw Dr. Dorothyann Peng January 2007 and referred for cardiology evaluation.  The patient denied any chest pain or shortness of breath.  Echocardiogram  showed evidence of mild to moderate lateral wall ischemia.  Echocardiogram  on December 07, 2005, showed severe concentric left ventricular hypertrophy  with normal left ventricular systolic function, ejection fraction 55% .  There was evidence of impaired left ventricular relaxation.  There is mild  bi-atrial enlargement.  The aortic valve was mildly sclerotic without  evidence of stenosis or deficiency.  There is mild tricuspid regurgitation.  There is trace mitral regurgitation.  There is mild to moderate pulmonary  hypertension.   The patient underwent cardiac catheterization December 19, 2005, which showed  severe three-vessel coronary artery disease.  Left main about 30% distal  tapering noted.  The LAD had 50 to 70% hyperdense ostial stenosis followed  by 50% stenosis in the proximal third after the first diagonal branch.  The  left circumflex had 90% A-V groove stenosis in the mid portion.  The right  coronary artery was a large dominant vessel with diffuse 90 to 9% stenosis.  Left ventriculography was not performed due to elevated creatinine at 3.2.   The patient had poorly controlled hypertension, diabetes, and severe  peripheral vascular disease.  The patient has chronic renal insufficiency.  It was agreed that coronary artery bypass graft surgery was probably the  best treatment for revascularization.  She had __________ procedure.   HOSPITAL COURSE:  Postoperatively, the patient had numerous medical issues.  The patient has a known history of chronic kidney disease secondary to  diabetes mellitus and hypertension.  Nephrology was following the patient  starting on January 18, 2006.  The patient did have hyperkalemia on January 19, 2006, and was treated with calcium gluconate.  The patient did not need  hemodialysis yet in fact.  Nephrology  monitored the patient's Lasix and  monitored her closely during her stay.  The patient had a baseline  creatinine of 3.3 preoperatively during her hospital stay.  She ranged from  about 3.3 to 4 with good urine output.  The patient's creatinine decreased  to 3.1 on January 27, 2006.   On postoperative day #2,  January 20, 2006, the patient was alert but did not  carry on a conversation and did not complete all commands to questions.  A  CT of the head was ordered which showed a right basilar ganglia stroke of  indeterminate age.  Neurology was consulted on January 20, 2006, and has been  following the patient.  Speech therapy was also consulted.  An MRI was to be  performed clarify CT scan of the head; however, the patient had pacing  wires, and this was unable to be accomplished.  On January 22, 2006, he  patient seemed more awake, alert, and interactive.  She answered questions  appropriately, followed commands.  Speech was understandable yet slurred.  The patient continued to be followed by speech, and she had a modified  barium swallow that showed a positive silent aspiration of thick liquids  secondary to delayed swallowing and pharyngeal elevation.  The patient was  started on honey-thick liquids and p.o. medications crushed in applesauce.  A swallowing function study on January 31, 2006, showed moderate oropharyngeal  dysphagia secondary to delayed swallowing, irritation, and decreased and  inconsistent reaction to aspiration.  The patient coughed on thin but no  aspiration with thick liquids which were also effective in eliminating  aspiration.  Speech has been following the patient daily.   Anemia: The patient did have postoperative anemia.  She did not receive any  packed red blood cells.  She maintained a hemoglobin of 8.5 to 9.  The  patient was started on Aranesp weekly by nephrology.  She did have iron  studies; results ae still pending.  The patient developed postop atrial  fibrillation.  She has gone in and out  of atrial fibrillation/flutter and normal sinus rhythm throughout her stay.  She was treated with IV Amiodarone, diltiazem, and heparin and Coumadin.  The patient did start p.o. amiodarone.  She has been in normal sinus rhythm  for the past 2 days.  Her Coumadin dose has been monitored by pharmacy, and  she is to do daily INR.   The patient does have a history of uncontrolled hypertension.  Her blood  pressure has been slightly high postoperatively.  She is being monitored by  cardiology.  The  patient was started on hydralazine 25 mg p.o. daily on  February 01, 2006, for hypertension.   The patient has a history of diabetes mellitus and on insulin.  She has been  receiving Lantus and a sliding scale insulin which has kept her blood sugar  under good control.  The patient has been monitored with CBGs every 24 hours  during her stay.   The patient has been making slow progress.  She has been receiving inpatient  physical therapy.  The patient was transferred to 2000 on January 31, 2006,  per protocol.  The patient will go to inpatient rehab for deconditioning.  Cardiac rehab was consulted on February 01, 2006, to help prepare the patient  for inpatient rehab.   CONDITION ON DISCHARGE:  Stable.   DISPOSITION:  The patient will be discharged to inpatient rehab.   DISCHARGE MEDICATIONS:  1.  Aspirin 325 mg p.o. daily.  2.  Lantus 40 units subcutaneously nightly.  3.  Hydralazine 25 mg p.o. 3 times a day.  4.  Sliding scale insulin with CBGs q. 4 h.  5.  Lopressor suspension 25 mg 3 times a day.  6.  Protonix 40 mg via tube daily.  7.  Synthroid 137 mcg via tube daily.  8.  Amiodarone 200 mg via tube twice daily.  9.  Diltiazem suspension 60 mg via tube q. 8 h.  10. Aranesp 100 mcg subcutaneously every Monday.  11. Coumadin 5 mg p.o. at every 1800.  12. Nutrition supplement for impaired renal function 1000 mL p.o. ever 20      hours.  13. Morphine  sulfate 2 to 5 mg IV every 1 hour p.r.n.  14. Oxycodone 5 to 10 mg p.o. every 3 hours p.r.n.  15. Thick-It instant food thickener 227 g p.o. p.r.n.   DISCHARGE INSTRUCTIONS:  The patient will be following a dysphagia 3 honey-  thick liquid with chin tuck, and she will be followed by speech, increasing  her diet when appropriate.  The patient will have inpatient physical therapy  to help deconditioning.  She may shower and clean her wounds with mild soap  and water.  The patient was instructed to notify CVTS if any problems shall  arise.   FAMILY HISTORY:  The patient will have a followup appointment to be  determined at a later date when she is discharged from rehab.      Constance Holster, [place]      Evelene Croon, M.D.  Electronically Signed    JMW/MEDQ  D:  02/01/2006  T:  02/01/2006  Job:  [id number]   cc:   Casimiro Needle L. Thad Ranger, M.D.  Fax: [phone]   Bevelyn Buckles. Nash Shearer, M.D.  Fax: [phone]   Dyke Maes, M.D. Fax: [phone]

## 2011-04-01 NOTE — Op Note (Signed)
NAMEERSA, DELANEY NO.:  [id number]   MEDICAL RECORD NO.:  [id number]          PATIENT TYPE:  INP   LOCATION:  2313                         FACILITY:  [facility]   PHYSICIAN:  Guadalupe Maple, M.D.  DATE OF BIRTH:  April 18, 1941   DATE OF PROCEDURE:  01/18/2006  DATE OF DISCHARGE:                                 OPERATIVE REPORT   PROCEDURE:  Intraoperative transesophageal echocardiography.   Ms. Xylina Rhoads is a 70 year old African-American female with severe  three-vessel coronary artery disease who is scheduled to undergo coronary  artery bypass grafting by Dr. Laneta Simmers. Preoperative echocardiography revealed  severe left ventricular hypertrophy. Intraoperative transesophageal  echocardiography was requested to evaluate the left ventricle and to  determine if any other valvular pathology was present.   The patient was brought to the operating room at [facility] and  general anesthesia was induced without difficulty. The trachea was intubated  without difficulty. The transesophageal echocardiography probe was then  inserted into the patient's esophagus without difficulty.   IMPRESSION:  Pre bypass findings:  1.  Aortic valve:  The aortic valve was trileaflet. There was some      calcification of the noncoronary cusp but all three cusps appeared to      open normally and there was no aortic insufficiency present.  2.  Mitral valve:  The mitral valve leaflets appeared to coapt well. There      was trace mitral insufficiency with no prolapse or fluttering of ht      leaflets.  3.  Left ventricle:  There was severe left ventricular hypertrophy. Left      ventricular wall thickness measured 2.55 cm at end diastole at the mid      papillary level of the posterior wall. The hypertrophy was concentric      and ejection fraction was estimated at 65-70%. There was evidence of      impaired relaxation with the pulse wave Doppler interrogation of the  mitral inflow revealed an E wave velocity of 26 cm per second and an A      wave of 60 cm per second which revealed an E/A ratio of 0.48.      Interrogation of the left ventricular outflow tract revealed no evidence      of systolic anterior motion of the mitral valve.  4.  Right ventricle:  There was right ventricular hypertrophy. The right      ventricular size appeared to be within normal limits and there was good      contractility of the right ventricular free wall. Right ventricular free      wall thickness measured 1.1 cm at end diastole.  5.  Tricuspid valve:  The tricuspid valve leaflets appeared normal and there      was 1-2+ tricuspid insufficiency noted.  6.  Interatrial septum:  The interatrial septum was intact without evidence      of patent foramen ovale or atrial septal defect.  7.  Left atrium:  The left atrial size appeared to be moderately enlarged      but there was no evidence of thrombus in  the left atrium or left atrial      appendage.  8.  Ascending aorta:  There was moderate calcification of the walls of the      ascending aorta but no significant atheromatous disease.  9.  Descending aorta:  There was mild to moderate atheroma of the descending      aorta but the aortic diameter measured 2.4 cm.   Post bypass findings:  1.  Aortic valve:  The aortic valve was unchanged from the pre bypass study.      It was a trileaflet valve which opened normally with no aortic      insufficiency. There was calcification of the noncoronary cusp as noted.  2.  Mitral valve:  The mitral leaflets appeared to coapt well. There was no      systolic anterior motion of the mitral valve and there was trace mitral      insufficiency.  3.  Left ventricle:  The left ventricle appeared unchanged from the pre      bypass study. There was severe left ventricular hypertrophy. The left      ventricle contractility appeared good in all segments interrogated.      There was near cavity  obliteration at times with ejection fraction      estimated at 70%.           ______________________________  Guadalupe Maple, M.D.     DCJ/MEDQ  D:  01/18/2006  T:  01/18/2006  Job:  [id number]

## 2011-04-01 NOTE — Discharge Summary (Signed)
NAMEMarland Kitchen  IDALIZ, TINKLE NO.:  [id number]   MEDICAL RECORD NO.:  [id number]          PATIENT TYPE:  IPS   LOCATION:  4037                         FACILITY:  [facility]   PHYSICIAN:  Ellwood Dense, M.D.   DATE OF BIRTH:  1941-01-26   DATE OF ADMISSION:  02/06/2006  DATE OF DISCHARGE:  02/15/2006                                 DISCHARGE SUMMARY   DISCHARGE DIAGNOSES:  1.  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  2.  Acute on chronic renal insufficiency, improving.  3.  Paroxysmal atrial fibrillation.  4.  Diabetes mellitus, type 2.  5.  Anemia of chronic disease.  6.  Hypothyroidism.  7.  Dysphagia.   HISTORY OF PRESENT ILLNESS:  Ms. Megan Vasquez is a 70 year old female  with history of hypertension, diabetes mellitus type 2, chronic renal  insufficiency, coronary artery disease admitted January 14, 2006 for coronary  artery bypass grafting x4 by Dr. Laneta Simmers.  On January 20, 2006 the patient had  aphasia and difficulty following commands.  CT scan of brain done showed  right basal ganglia infarct of questionable age.  Electroencephalogram done  showed diffuse slowing with question of encephalopathy and no dementia.  Cardiac echocardiogram done showed ejection fraction of 50 to 55%, small  left pleural effusion and mild increasing AV thickness.  Dr. Thad Ranger was  consulted for input and as patient is aphasic past surgery he felt patient  with cardioembolic stroke and recommended Coumadin for cerebrovascular  accident prophylaxis.  [facility] has been following for atrial fibrillation and  patient treated with intravenous Cardizem and beta blockers. Amiodarone  added for rate control.  Currently patient is in NSR.  The patient has also  had issues with fluid overload with renal service following for adjustments  of diuretic.  Currently the patient's creatinine is stable at 4.3,  indication of patient requiring AV graft in  the future.  The patient  continues with dysphagia, currently on D3 diet, nectar-thick liquids. She is  being mobilized with physical therapy and noted to have weakness, decrease  in balance and requiring shoes for walker use.  See IR consult for further  therapies.   PAST MEDICAL HISTORY:  Significant for hypertension, dyslipidemia, diabetes  mellitus type 2 with diabetic neuropathy. Osteoarthritis, hypothyroidism and  chronic renal insufficiency.   ALLERGIES:  No known drug allergies.   FAMILY HISTORY:  Positive for coronary artery disease, cerebrovascular  accident and diabetes.   SOCIAL HISTORY:  Patient lives alone in two level home with three steps at  entry, one flight to bedroom. She was walking prior to admission.  She  smokes one to two cigarettes a day.  She does not use any alcohol.   HOSPITAL COURSE:  Ms. Megan Vasquez was admitted to rehabilitation on  February 06, 2006 for inpatient therapies to consist of physical therapy,  occupational therapy and speech therapy daily.  Past admission she was  maintained on a D3 diet with nectar-thick liquids.  Coumadin was continued  throughout her stay.  Her INR was therapeutic at 2.3 at the time of  discharge and patient is discharged on 3 mg of Coumadin a day.  Labs were  checked past admission showing the patient's postoperative anemia to be  stable with H/H at 9.9 and 29.4, white count 7.5, platelet count 378,000.  Check of lytes revealed BUN of 56 and creatinine 3.9.  Renal was consulted  for input regarding further adjustments of medications and need for AV graft  during this stay.  The patient declined having AV graft placed for now. She  is to follow up with Dr. Caryn Section regarding input regarding need for graft in the  future.  Last check of labs from February 14, 2006 shows further improvement in  renal status with sodium at 137, potassium 4.5, chloride 110, cO2 20, BUN  38, creatinine 3.5, glucose 80.  Calcium was 8.4.  Repeat  swallow study not  done prior to discharge secondary to continued signs of audible aspiration  with thins.  The patient was started on water protocol.  Both patient and  family have been instructed regarding strict oral hygiene prior to use of  water and for monitoring for signs of aspiration.  A repeat swallow study is  to be done on an outpatient basis on March 03, 2006.   The patient has had no complaints of chest pain, dyspnea during this stay.  Her sternal incision is noted to be healing well without any signs or  symptoms of infection.  Left lower extremity incision is healing well, two  scabbed areas are noted at proximal and distal part of incision.  She does  continue with 2+ pitting edema left lower extremity and minimal edema of  right lower extremity.  She is encouraged to continue with elevation and  TED'S, use of Ace wrap left lower extremity to help with edema control.  Her  p.o. intake remains good.  Blood sugars were checked on a q.a.c. and q.h.s.  basis during the stay and have shown good control ranging from occasional  lows in 50's to 140's range.  Blood pressure is stable running from 130's to  150's systolic's and 60's to 70's diastolic's. Heart rate has ranged from  50's to 60's range.  During her stay in rehabilitation the patient has made  good progress in speech and voice goals. She is currently at supervision  level for conversational speech.  She is aware of diagnosis and deficits  with minimal verbal que's.  Longterm memory is intact.  Immediate memory is  decreased. Working memory shows moderate decrease.  Episode of memory of  daily events is within functional limits, 90% of the time.  The patient is  noted to have problems with complex and mild decision-making requiring  supervision.  Mild deficits and reasoning noted.  Moderate deficits with  complex tasks.  Basic auditory comprehension is intact.  Basic speech shows mild decrease in organization.  Right  lower extremity strength continues to  be decreased at 4+/5.  Currently the patient is at modified independent  levels for ambulating greater than drawing in feet and balance with  activities with rolling walker improving.  She is at supervision for car  transfers, minimal assist to navigate 15 stairs.  She is at modified  independent level for ADL needs.  Further follow up therapies to include  home health, PT, OT, speech therapy by [facility] past  discharge.  On February 15, 2006 the patient is discharged to home.   DISCHARGE MEDICATIONS:  1.  Coumadin 2 mg 1.5 p.o. q.p.m.  2.  Coated aspirin 81 mg daily.  3.  Ferrous sulfate 325 mg t.i.d.  4.  Amiodarone 200 mg daily.  5.  Alprazolam 25 mg t.i.d.  6.  Synthroid 137 mcg a day.  7.  Cardizem CD 180 mg a day.  8.  Toprol XL 100 mg a day.  9.  Lantus Insulin, 6 units in A.M., 16 units in P.M.  10. Tylenol as needed for pain.   DIET:  Diabetic diet, nectar-thick liquids, water protocol.   WOUND CARE:  Keep area clean and dry.   FOLLOW UP:  Patient to follow up with Dr. Thomasena Edis Mar 30, 2006 at 10:45.  Follow up with Dr. Caryn Section February 28, 2006 at 11:15 for 11:30 appointment.  Follow up with Dr. Allyson Sabal and Dr. Laneta Simmers in the next few weeks.  Re-schedule  Dr. Zella Ball office appointment.  Repeat swallow study March 03, 2006 at  10:00 A.M.      Greg Cutter, P.A.    ______________________________  Ellwood Dense, M.D.    PP/MEDQ  D:  02/15/2006  T:  02/16/2006  Job:  [id number]   cc:   Evelene Croon, M.D.  [address]  [place]  [place] [address]   Nanetta Batty, M.D.  Fax: [phone]   Wilber Bihari. Caryn Section, M.D.  Fax: [phone]   Candyce Churn. Allyne Gee, M.D.  Fax: [phone]

## 2011-04-01 NOTE — Op Note (Signed)
[name], [name]          ACCOUNT NO.:  [id number]   MEDICAL RECORD NO.:  [id number]          PATIENT TYPE:  AMB   LOCATION:  SDS                          FACILITY:  [facility]   PHYSICIAN:  Balinda Quails, M.D.    DATE OF BIRTH:  08/17/1941   DATE OF PROCEDURE:  05/10/2006  DATE OF DISCHARGE:  05/10/2006                                 OPERATIVE REPORT   SURGEON:  Denman George, MD   ASSISTANT:  Constance Holster, P.A.-C.   ANESTHESIA:  Local with MAC.   PREOPERATIVE DIAGNOSIS:  End stage renal failure.   POSTOPERATIVE DIAGNOSIS:  End stage renal failure.   PROCEDURE:  Left brachial cephalic arteriovenous fistula.   OPERATIVE PROCEDURE:  The patient was brought to the operating room in  stable condition.  She was placed in the supine position.  The left arm was  prepped and draped in a sterile fashion.  The skin and subcutaneous tissues  instilled with 1% Xylocaine with epinephrine.  A transverse skin incision  was made through the left antecubital fossa.  Dissection was carried down to  expose the antecubital veins.  This bridging vein between the cephalic and  basilic veins.  The cephalic vein was mobilized.  The bridging vein ligated  with 2-0 silk and divided.  The cephalic vein was freed and ligated distally  with 3-0 silk and divided.  It dilated up to 3.5 mm easily.  It was flushed  with heparin saline solution and controlled with a bulldog clamp.  The  brachial artery was then exposed, freed and encircled with vessel loops.  It  was controlled proximally and distally with serrefine clamps.  A  longitudinal arteriotomy was made.  The vein was anastomosed end-to-side to  the brachial artery using a running 7-0 Prolene suture.  The clamps were  then removed.  There was excellent flow present.  Adequate hemostasis was  obtained, the sponge and instrument counts were correct.  The subcutaneous  tissue was closed with a running 3-0 Vicryl suture.  The skin was closed  with 4-0 Monocryl.  Steri-Strips were applied.  The patient tolerated the  procedure well.  She was transferred to the recovery room in stable  condition.      Balinda Quails, M.D.  Electronically Signed     PGH/MEDQ  D:  05/10/2006  T:  05/10/2006  Job:  [id number]

## 2011-04-01 NOTE — Assessment & Plan Note (Signed)
Ms. Megan Vasquez returns to the clinic today accompanied by her daughter.  The  patient is a 70 year old female with a history of hypertension and type 2  diabetes mellitus, along with chronic renal insufficiency, coronary artery  disease.  She was admitted January 14, 2006 for coronary artery bypass grafting  x4 with Dr. Laneta Simmers.  On January 20, 2006, the patient had aphasia and  difficulty following commands.  CAT scan of the brain showed a right basal  ganglion infarct of questionable age.  EEG was done and showed diffuse  slowing with questionable encephalopathy with no dementia.  Cardiac  echocardiogram showed an ejection fraction of 50-55% with small left pleural  effusion and mild increasing AV thickness.  Dr. Thad Ranger was consulted for  possible stroke with aphasia post surgery.  He felt the patient had suffered  a cardioembolic stroke and recommended Coumadin for cerebrovascular stroke  prophylaxis.   The patient was subsequently moved to the rotation unit February 06, 2006 and  remained there through discharge February 15, 2006.   Since discharge, the patient had been receiving home health PT, OT and  speech therapy but those have stopped at this point.  She uses a single  point cane on the outside of the home and walks without assistive device  inside the home.  She is due for cardiac rehabilitation starting this coming  Monday, Apr 03, 2006.  She continues to see Dr. Allyne Gee, her primary care  physician, and last saw them in late April.  She continues to be followed by  Dr. Allyson Sabal for hypertension and recently her Cardia was increased to 240 mg  daily.  Dr. Allyson Sabal also follows her Coumadin.  She is due for a regular  follow up with Dr. Caryn Section for her renal insufficiency.   MEDICATIONS:  1.  Lantus insulin 6 units q.a.m. and 10 units q.p.m.  2.  Levothyroxine 137 mcg daily.  3.  Amiodarone 200 mg daily.  4.  Toprol XL 100 mg daily.  5.  Cardia XL 240 mg daily.  6.  Warfarin 2 mg daily.  7.   Ferrous sulfate 325 mg t.i.d.  8.  Hydralazine 25 mg t.i.d.   REVIEW OF SYSTEMS:  Positive for high blood pressure.   PHYSICAL EXAMINATION:  Well-appearing, fit adult female seated in a regular  chair.  She has her daughter present.  The patient ambulates with a single  point cane.  Blood pressure was 160/63 with a pulse of 55, respiratory rate  17 and O2 saturation 100% on room air.  The patient has 4+/5 strength  throughout the bilateral upper and lower extremities.  Bulk and tone were  normal.  Reflexes were 2+ and symmetrical.  Sensation was intact to light  touch throughout.  The patient understands verbal speech and has good verbal  output.   IMPRESSION:  1.  Status post cardioembolic left brain stroke after coronary artery bypass      grafting x4.  2.  Chronic renal insufficiency.  3.  Paroxysm atrial fibrillation.  4.  Diabetes mellitus, type 2.  5.  Anemia of chronic disease.  6.  Hypothyroidism.  7.  Dysphagia, aphasia and right lower extremity weakness, improved.   The patient has done well after cardioembolic stroke after bypass surgery.  She is due to start cardiac rehabilitation this coming Monday and needs to  follow up with Dr. Allyson Sabal for adequate blood pressure control.  I have asked  her to check her blood pressure at home about  three times a week and record  those values for Dr. Allyson Sabal to review.  The patient's blood sugar has been in  the 80 to 150 range on the Lantus and Dr. Allyne Gee is following that.  She  has  completed all therapy at this point and we will plan on seeing the patient  in follow up on an as needed basis.  The patient is comfortable with that  plan at the present time.           ______________________________  Ellwood Dense, M.D.     DC/MedQ  D:  03/30/2006 11:12:23  T:  03/31/2006 01:26:39  Job #:  [id number]

## 2011-04-01 NOTE — Op Note (Signed)
NAME:  Megan Vasquez, Megan Vasquez NO.:  [id number]   MEDICAL RECORD NO.:  [id number]          PATIENT TYPE:  INP   LOCATION:  2313                         FACILITY:  [facility]   PHYSICIAN:  Evelene Croon, M.D.     DATE OF BIRTH:  Feb 03, 1941   DATE OF PROCEDURE:  01/18/2006  DATE OF DISCHARGE:                                 OPERATIVE REPORT   PREOPERATIVE DIAGNOSIS:  Left main and severe 3 vessel coronary artery  disease with abnormal Cardiolite stress test.   POSTOPERATIVE DIAGNOSIS:  Left main and severe 3 vessel coronary artery  disease with abnormal Cardiolite stress test.   OPERATIVE PROCEDURE:  Median sternotomy, extracorporeal circulation,  coronary artery bypass graft surgery x4 using a left internal mammary artery  graft to left anterior descending coronary, with a saphenous vein graft to  the diagonal branch of the LAD, a saphenous vein graft to the obtuse  marginal branch of the left circumflex coronary, and a saphenous vein graft  to the distal right coronary artery. Endoscopic vein harvesting from the  left leg.   ATTENDING SURGEON:  Evelene Croon, M.D.   ASSISTANT:  Rowe Clack, P.A.-C.   ANESTHESIA:  General endotracheal.   CLINICAL HISTORY:  This patient is a 70 year old woman, with severe  hypertension and diabetes, who also has a recent diagnosis of renal  insufficiency with a creatinine that was about 3.3 in January 2007. She was  asymptomatic, but referred for cardiology evaluation due to her risk  factors. A [place] Cardiolite scan showed scintigraphic evidence of mild  to moderate lateral ischemia. An echocardiogram showed severe concentric  LVH, with normal left ventricular systolic function, with an ejection  fraction of greater than 55%. There was evidence of impaired left  ventricular relaxation. There was no aortic stenosis or insufficiency. There  was mild tricuspid regurgitation and trace mitral regurgitation. She  subsequently underwent  cardiac catheterization on December 19, 2005, which  showed about 30% distal left main tapering stenosis. The LAD had a 60% to  75% hypodense ostial stenosis, followed by about 50% stenosis in the  proximal third, after the diagonal branch. The diagonal branch was not well  visualized on the catheterization, but appeared to be a moderate size  vessel. The left circumflex had 90% stenosis within the AV groove in the mid  portion, with a single large marginal branch that had diffuse disease in it.  The right coronary artery was a large dominant vessel with diffuse 90% to  95% mid vessel stenosis. Left ventriculography was not performed due to her  elevated creatinine. After review of the angiogram and examination of the  patient, it was felt that coronary artery bypass graft surgery was the best  treatment to prevent further ischemia and infarction. I discussed the  operative procedure with the patient and her daughter, including  alternatives, benefits and risks, including bleeding, blood transfusion,  infection, stroke, myocardial infarction, graft failure, progressive renal  failure requiring permanent dialysis and death. They understood and agreed  to proceed.   OPERATIVE PROCEDURE:  The patient was taken to the operating room and placed  on the  table in the supine position. After induction of general endotracheal  anesthesia, a Foley catheter was placed in the bladder using a sterile  technique. Then, the chest, abdomen and both lower extremities were prepped  and draped in the usual sterile manner. The chest was entered through a  median sternotomy incision. The pericardium was opened in the midline.  Examination of the heart showed good ventricular contractility. The  ascending aorta was relatively long and had no palpable plaques in it.   A transesophageal echocardiogram was performed and read by Dr. Kipp Brood.  This showed severe concentric left ventricular hypertrophy, with a  left  ventricular dimension of about 2.5 cm. There was good systolic function with  evidence of diastolic dysfunction. There was no mitral regurgitation. There  was no aortic stenosis or insufficiency. There also appeared to be some  right ventricular hypertrophy.   Then, the left internal mammary artery was harvested from the chest wall as  a pedicle graft. This was a medium caliber vessel with excellent blood flow  through it. At the same time, a segment of greater saphenous vein was  harvested from the left leg using endoscopic vein harvest technique. This  vein was of medium size and good quality. We initially examined the  saphenous vein adjacent to the right knee and this vein was small and  unsuitable.   Then, the patient was heparinized, and when an adequate activated clotting  time was achieved, the distal ascending aorta was cannulated using a #20  [place] aortic cannula for arterial inflow. Venous outflow was achieved using  a 2-stage venous cannula through the right atrial appendage. An antegrade  cardioplegia and vent cannula were inserted into the aortic root.   The patient was placed on cardiopulmonary bypass and the distal coronaries  identified. The LAD was a large graftable vessel with mild distal disease in  it. The diagonal branch was a medium size vessel that was diffusely diseased  but graftable distally. The obtuse marginal was also a large vessel that was  diffusely diseased but graftable. The distal right coronary artery had very  mild disease in it and was suitable for grafting. There was a moderate-sized  posterior descending and posterolateral branch that also had mild distal  disease in them. There was severe left ventricular hypertrophy.   Then, the aorta was crossclamped and 500 cc of cold blood antegrade  cardioplegia was administered in the aortic root and quickly arrested the heart. Systemic hypothermia to 20 degrees Centigrade and topical hypothermia   with iced saline was used. A temperature probe was placed in the septum,  insulating and padding the pericardium.   The first distal anastomosis was performed to the distal right coronary  artery just before the takeoff of the posterior descending branch. The  internal diameter was greater than 2.5 mm. The conduit used was a segment of  the greater saphenous vein. The anastomosis was performed in an end-to-side  manner using continuous 7-0 Prolene suture. Flow was noted through the graft  and was excellent.   The second distal anastomosis was performed to the obtuse marginal branch.  The internal diameter was about 1.75 mm. The conduit used was a second  segment of greater saphenous vein. The anastomosis was performed in an end-  to-side manner using continuous 7-0 Prolene suture. Flow was noted through  the graft and was excellent. Then, another dose of cardioplegia was given  down vein grafts and in the aortic root.   The third  distal anastomosis was performed to the diagonal branch. The  internal diameter was about 1.6 mm distally. The conduit used was a third  segment of the greater saphenous vein. The anastomosis was performed in an  end-to-side manner using continuous 7-0 Prolene suture. Flow was noted  through the graft and was excellent.   The fourth distal anastomosis was performed in the mid portion of the left  anterior descending coronary artery. The internal diameter of this vessel  was about 2.5 mm. The conduit used was the left internal mammary graft and  this was brought out through an opening in the left pericardium, anterior to  the phrenic nerve. It was anastomosed to the LAD in an end-to-side manner  using continuous 8-0 Prolene suture. The pedicle was sutured to the  epicardium with 6-0 Prolene sutures. The patient was rewarmed to 37 degrees  Centigrade. With the crossclamp in place, the 3 proximal vein graft  anastomoses were performed in the aortic root in an  end-to-side manner using  continuous 6-0 Prolene suture. Then the clamp was removed from the mammary  pedicle. There was rapid warming of the ventricular septum and return of  spontaneous ventricular fibrillation. Crossclamp was removed at time of 64  minutes. The patient spontaneously converted to sinus rhythm.   Then, the proximal and distal anastomoses were examined and were hemostatic.  The lie of the grafts was satisfactory. Graft markers were placed around the  proximal anastomoses. Two temporary right ventricular and a right atrial  pacing wires were placed and brought out through the skin.   When the patient had rewarmed to 37 degrees Centigrade, she was weaned from  cardiopulmonary bypass on no inotropic agents. Total bypass time was 93  minutes. Cardiac function appeared excellent. Transesophageal echocardiogram  showed well-preserved left ventricular contractility. Protamine was then given and the venous and aortic cannulas were removed without difficulty.  Hemostasis was achieved. The patient was given 10 units of platelets for  coagulopathy. Three chest tubes were placed, with 1 tube in the posterior  pericardium, 1 in the left pleural space and 1 in the anterior mediastinum.  The pericardium was loosely reapproximated over the heart. The sternum was  closed with #6 stainless steel wires. The fascia was closed with continuous  #1 Vicryl suture. The subcutaneous tissue was closed with continuous 2-0  Vicryl and the skin with a 3-0 Vicryl subcuticular closure. The lower  extremity vein harvest site was closed in layers in a similar manner, with a  Blake drain left within the subcutaneous tunnel, since there was still some  oozing. The sponge, needle and instrument counts were correct according to  the scrub nurse. Dry sterile dressings were applied over the incisions and  around the chest tubes, which were hooked to Pleur-evac suction. The patient  remained hemodynamically  stable and was transported to the SICU in guarded  but stable condition.      Evelene Croon, M.D.  Electronically Signed     BB/MEDQ  D:  01/18/2006  T:  01/19/2006  Job:  [id number]   cc:   Nanetta Batty, M.D.  Fax: [phone]   Cardiac Catheterization Lab  [facility]

## 2011-04-01 NOTE — Consult Note (Signed)
Megan Vasquez, Megan Vasquez NO.:  [id number]   MEDICAL RECORD NO.:  [id number]          PATIENT TYPE:  INP   LOCATION:  2313                         FACILITY:  [facility]   PHYSICIAN:  Dyke Maes, M.D.DATE OF BIRTH:  [birth year]   DATE OF CONSULTATION:  01/18/2006  DATE OF DISCHARGE:                                   CONSULTATION   REASON FOR CONSULTATION:  Chronic kidney disease.   HISTORY OF PRESENT ILLNESS:  The patient is a 70 year old black female with  chronic kidney disease secondary to diabetes and hypertension who is status  post coronary artery bypass grafting x4 today.  We are asked to see her for  her chronic kidney disease and because of the high risk of acute renal  failure postoperatively.  She has a baseline serum creatinine in the mid-to-  low 3's.  She was on Avapro prior to the surgery.  Apparently, there were no  unusual events during the surgery.  She is currently back in a room,  intubated and sedated.   PAST MEDICAL HISTORY:  1.  Hypertension for over 30 years.  2.  Diabetes for over 27 years.  3.  Hypothyroidism.   MEDICATIONS PRIOR TO ADMISSION:  1.  Lasix 20 mg daily.  2.  Synthroid 137 mcg daily.  3.  Cardizem LA 300 mg daily.  4.  Avapro 300 mg daily.  5.  Neurontin 300 mg q.h.s.  6.  Lantus 20 mg q.h.s.  7.  Aspirin 81 mg daily.   ALLERGIES:  NONE.   SOCIAL HISTORY:  She has a 20-pack-year history of smoking.  She does not  drink alcohol.  She is divorced.  She lives with her grandson who is age 67.  She has a daughter with whom I spoke with today.  She has three children.   FAMILY HISTORY:  Father died at the age of 59 of stroke.  Mother died at the  age of 22 of an aneurysm.  One brother died at the age of 64 of a heart  attack.  A sister died at age 66 of a heart attack.  One sister with  diabetes and breast cancer.   REVIEW OF SYSTEMS:  Unobtainable at the present time because the patient is  sedated and  intubated.   PHYSICAL EXAMINATION:  VITAL SIGNS:  Blood pressure 118/61, pulse 76,  temperature 97.6.  GENERAL:  The patient is intubated and sedated.  NECK:  There is a right Swan-Ganz catheter in place.  LUNGS:  Clear anteriorly.  HEART:  Regular rate and rhythm without murmurs, rubs, or gallops.  CHEST:  Three chest tubes coming from the substernal region.  ABDOMEN:  Positive bowel sounds, soft, nondistended.  No hepatomegaly.  EXTREMITIES:  No edema on the right.  The left is wrapped post-saphenous  vein harvest.  NEUROLOGIC:  She is sedated.   LABORATORY DATA:  Serum creatinine 3.6 on January 16, 2006.  Potassium  postoperatively is 4.7.  Hemoglobin 7.9.   IMPRESSION:  1.  Coronary disease status post coronary artery bypass grafting x4.  2.  Chronic kidney disease, stage 4, secondary to  diabetes and hypertension.  3.  Hypertension.  4.  Diabetes.  5.  Secondary hyperparathyroidism.   PLAN:  There is not much to add at this time.  We will continue to follow  her daily, as she is at high risk of acute renal failure.  I did speak with  her daughter.  Both she and the patient were and are aware of the high risk  of acute renal failure and the possible need for hemodialysis  postoperatively.  I would obviously avoid all nephrotoxins, including  contrast and nonsteroidal medications at the present time.  Would use  pressors to keep systolic blood pressure greater than 95.   Thank you very much for the consult.  I will follow the patient with you.           ______________________________  Dyke Maes, M.D.     MTM/MEDQ  D:  01/18/2006  T:  01/19/2006  Job:  [id number]

## 2011-08-04 LAB — RENAL FUNCTION PANEL
Albumin: 3.9
BUN: 92 — ABNORMAL HIGH
Chloride: 104
Creatinine, Ser: 4.47 — ABNORMAL HIGH
Glucose, Bld: 162 — ABNORMAL HIGH
Phosphorus: 4.8 — ABNORMAL HIGH

## 2011-08-04 LAB — HEMOGLOBIN AND HEMATOCRIT, BLOOD
HCT: 35.7 — ABNORMAL LOW
Hemoglobin: 11.6 — ABNORMAL LOW

## 2011-08-04 LAB — IRON AND TIBC
Iron: 133
Saturation Ratios: 58 — ABNORMAL HIGH
TIBC: 230 — ABNORMAL LOW

## 2011-08-05 LAB — POCT I-STAT 4, (NA,K, GLUC, HGB,HCT)
HCT: 42
Hemoglobin: 14.3
Potassium: 4.6
Sodium: 137

## 2011-08-08 LAB — RENAL FUNCTION PANEL
CO2: 20
Calcium: 9.2
Chloride: 107
GFR calc Af Amer: 16 — ABNORMAL LOW
GFR calc non Af Amer: 13 — ABNORMAL LOW
Glucose, Bld: 146 — ABNORMAL HIGH
Sodium: 137

## 2011-08-08 LAB — IRON AND TIBC
Iron: 64
UIBC: 139

## 2011-08-09 LAB — COMPREHENSIVE METABOLIC PANEL
Albumin: 3.7
Alkaline Phosphatase: 54
BUN: 63 — ABNORMAL HIGH
CO2: 21
Chloride: 105
Creatinine, Ser: 3.88 — ABNORMAL HIGH
GFR calc non Af Amer: 12 — ABNORMAL LOW
Glucose, Bld: 211 — ABNORMAL HIGH
Potassium: 4
Total Bilirubin: 0.3

## 2011-08-09 LAB — HEMOGLOBIN A1C: Mean Plasma Glucose: 133

## 2011-08-09 LAB — CBC
HCT: 36
Hemoglobin: 11.9 — ABNORMAL LOW
MCV: 89.1
Platelets: 154
RBC: 4.04
WBC: 6.2

## 2011-08-09 LAB — TSH: TSH: 0.156 — ABNORMAL LOW

## 2011-08-11 LAB — HEPATIC FUNCTION PANEL
ALT: 11
Albumin: 3.8
Alkaline Phosphatase: 50
Total Bilirubin: 0.6
Total Protein: 7.2

## 2011-08-11 LAB — CBC
Hemoglobin: 12.3
MCHC: 33.1
Platelets: 157
RDW: 14.9

## 2011-08-11 LAB — IRON AND TIBC
Saturation Ratios: 32
TIBC: 222 — ABNORMAL LOW

## 2011-08-11 LAB — LIPID PANEL
HDL: 58
Total CHOL/HDL Ratio: 3.2

## 2011-08-12 LAB — LIPID PANEL
LDL Cholesterol: 120 — ABNORMAL HIGH
Triglycerides: 74

## 2011-08-12 LAB — COMPREHENSIVE METABOLIC PANEL
Albumin: 3.7
BUN: 60 — ABNORMAL HIGH
Creatinine, Ser: 4 — ABNORMAL HIGH
GFR calc Af Amer: 13 — ABNORMAL LOW
GFR calc non Af Amer: 11 — ABNORMAL LOW
Potassium: 4
Sodium: 137
Total Bilirubin: 0.6

## 2011-08-12 LAB — HEMOGLOBIN A1C
Hgb A1c MFr Bld: 6.7 — ABNORMAL HIGH
Mean Plasma Glucose: 161

## 2011-08-16 LAB — RENAL FUNCTION PANEL
BUN: 59 mg/dL — ABNORMAL HIGH (ref 6–23)
CO2: 22 mEq/L (ref 19–32)
Calcium: 9.1 mg/dL (ref 8.4–10.5)
Chloride: 107 mEq/L (ref 96–112)
Creatinine, Ser: 3.6 mg/dL — ABNORMAL HIGH (ref 0.4–1.2)
GFR calc Af Amer: 15 mL/min — ABNORMAL LOW (ref >60)
GFR calc non Af Amer: 13 mL/min — ABNORMAL LOW (ref >60)

## 2011-08-16 LAB — IRON AND TIBC
Saturation Ratios: 34 % (ref 20–55)
Saturation Ratios: 36 % (ref 20–55)
TIBC: 228 ug/dL — ABNORMAL LOW (ref 250–470)
UIBC: 151 ug/dL
UIBC: 141 ug/dL

## 2011-08-16 LAB — POCT HEMOGLOBIN-HEMACUE
Hemoglobin: 11.2 g/dL — ABNORMAL LOW (ref 12.0–15.0)
Hemoglobin: 11.3 g/dL — ABNORMAL LOW (ref 12.0–15.0)

## 2011-08-23 LAB — IRON AND TIBC
Iron: 88
Saturation Ratios: 40
TIBC: 222 — ABNORMAL LOW
UIBC: 134

## 2011-08-23 LAB — CBC
HCT: 39
Hemoglobin: 12.7
MCHC: 32.6
MCV: 90.8
Platelets: 159
RBC: 4.3
RDW: 14.8
WBC: 6.9

## 2011-08-24 LAB — IRON AND TIBC
Iron: 105
Saturation Ratios: 42
TIBC: 249 — ABNORMAL LOW
UIBC: 144

## 2011-08-24 LAB — CBC
Platelets: 154
RDW: 14.8 — ABNORMAL HIGH
WBC: 5.3

## 2011-08-26 LAB — CBC
HCT: 34.2 — ABNORMAL LOW
Hemoglobin: 11.3 — ABNORMAL LOW
MCHC: 33
MCV: 89.4
Platelets: 168
RDW: 15 — ABNORMAL HIGH

## 2011-08-26 LAB — PHOSPHORUS: Phosphorus: 3.9

## 2011-08-26 LAB — COMPREHENSIVE METABOLIC PANEL
Alkaline Phosphatase: 56
BUN: 57 — ABNORMAL HIGH
Creatinine, Ser: 3.7 — ABNORMAL HIGH
Glucose, Bld: 172 — ABNORMAL HIGH
Potassium: 4.3
Total Protein: 7.5

## 2011-08-26 LAB — PROTEIN / CREATININE RATIO, URINE: Protein Creatinine Ratio: 0.57 — ABNORMAL HIGH (ref <0.15)

## 2011-08-26 LAB — PTH, INTACT AND CALCIUM
Calcium, Total (PTH): 8.9
PTH: 128.1 — ABNORMAL HIGH

## 2011-08-29 LAB — CBC
MCHC: 32.5
RBC: 4
RDW: 15.3 — ABNORMAL HIGH

## 2011-08-29 LAB — IRON AND TIBC
Iron: 109
Saturation Ratios: 49
TIBC: 221 — ABNORMAL LOW

## 2011-10-24 ENCOUNTER — Other Ambulatory Visit: Payer: Self-pay | Admitting: Internal Medicine

## 2011-10-24 DIAGNOSIS — Z1231 Encounter for screening mammogram for malignant neoplasm of breast: Secondary | ICD-10-CM

## 2011-11-28 ENCOUNTER — Ambulatory Visit
Admission: RE | Admit: 2011-11-28 | Discharge: 2011-11-28 | Disposition: A | Discharge status: Home / Self Care | Payer: Medicare Other | Source: Ambulatory Visit | Attending: Internal Medicine | Admitting: Internal Medicine

## 2011-11-28 DIAGNOSIS — Z1231 Encounter for screening mammogram for malignant neoplasm of breast: Secondary | ICD-10-CM

## 2012-01-06 ENCOUNTER — Other Ambulatory Visit (HOSPITAL_COMMUNITY): Payer: Self-pay | Admitting: Nephrology

## 2012-01-06 DIAGNOSIS — N186 End stage renal disease: Secondary | ICD-10-CM

## 2012-01-09 ENCOUNTER — Ambulatory Visit (HOSPITAL_COMMUNITY)
Admission: RE | Admit: 2012-01-09 | Discharge: 2012-01-09 | Disposition: A | Discharge status: Home / Self Care | Payer: Medicare Other | Source: Ambulatory Visit | Attending: Nephrology | Admitting: Nephrology

## 2012-01-09 ENCOUNTER — Other Ambulatory Visit (HOSPITAL_COMMUNITY): Payer: Self-pay | Admitting: Nephrology

## 2012-01-09 DIAGNOSIS — I4891 Unspecified atrial fibrillation: Secondary | ICD-10-CM | POA: Insufficient documentation

## 2012-01-09 DIAGNOSIS — I871 Compression of vein: Secondary | ICD-10-CM | POA: Insufficient documentation

## 2012-01-09 DIAGNOSIS — N186 End stage renal disease: Secondary | ICD-10-CM

## 2012-01-09 DIAGNOSIS — I2510 Atherosclerotic heart disease of native coronary artery without angina pectoris: Secondary | ICD-10-CM | POA: Insufficient documentation

## 2012-01-09 DIAGNOSIS — Z951 Presence of aortocoronary bypass graft: Secondary | ICD-10-CM | POA: Insufficient documentation

## 2012-01-09 DIAGNOSIS — F17200 Nicotine dependence, unspecified, uncomplicated: Secondary | ICD-10-CM | POA: Insufficient documentation

## 2012-01-09 DIAGNOSIS — E039 Hypothyroidism, unspecified: Secondary | ICD-10-CM | POA: Insufficient documentation

## 2012-01-09 DIAGNOSIS — E119 Type 2 diabetes mellitus without complications: Secondary | ICD-10-CM | POA: Insufficient documentation

## 2012-01-09 DIAGNOSIS — E785 Hyperlipidemia, unspecified: Secondary | ICD-10-CM | POA: Insufficient documentation

## 2012-01-09 DIAGNOSIS — Y832 Surgical operation with anastomosis, bypass or graft as the cause of abnormal reaction of the patient, or of later complication, without mention of misadventure at the time of the procedure: Secondary | ICD-10-CM | POA: Insufficient documentation

## 2012-01-09 DIAGNOSIS — I120 Hypertensive chronic kidney disease with stage 5 chronic kidney disease or end stage renal disease: Secondary | ICD-10-CM | POA: Insufficient documentation

## 2012-01-09 DIAGNOSIS — T82898A Other specified complication of vascular prosthetic devices, implants and grafts, initial encounter: Secondary | ICD-10-CM | POA: Insufficient documentation

## 2012-01-09 DIAGNOSIS — D649 Anemia, unspecified: Secondary | ICD-10-CM | POA: Insufficient documentation

## 2012-01-09 MED ORDER — IOHEXOL 300 MG/ML  SOLN
100.0000 mL | Freq: Once | INTRAMUSCULAR | Status: AC | PRN
  Administered 2012-01-09: 60 mL via INTRAVENOUS

## 2012-01-09 NOTE — H&P (Signed)
Megan Vasquez is an 71 y.o. female.   Chief Complaint: ESRD, AVF with decreased access flow rates HPI: as above  PMHX:   ESRD, DM, HTN, type 2 DM on Lantus, CAD, S/P CABG Atrial Fibrillation, Hypothyroidism, anemia, hyperlipidemia   Social History: Tobacco abuse  Allergies: NKDA  No current outpatient prescriptions on file as of 01/09/2012.   No current facility-administered medications on file as of 01/09/2012.    No results found for this or any previous visit (from the past 48 hour(s)). No results found.  Review of Systems  Constitutional: Negative for fever, chills and weight loss.  HENT: Negative for congestion.   Respiratory: Negative for shortness of breath.   Cardiovascular: Negative for chest pain.  Gastrointestinal: Negative for abdominal pain.  Neurological: Negative for headaches.    Physical Exam  Nursing note and vitals reviewed. Constitutional: She is oriented to person, place, and time. She appears well-developed and well-nourished.  Cardiovascular: Normal rate and regular rhythm.   Respiratory: Effort normal and breath sounds normal.  GI: Soft. Bowel sounds are normal.  Neurological: She is alert and oriented to person, place, and time.  Skin: Skin is warm.     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  For repeat 7mm Venous PTA.   Megan Vasquez T. 01/09/2012, 8:01 AM

## 2012-01-09 NOTE — Procedures (Signed)
Successful 7mm PTA of the LEFT AVF recurrent stenosis at the cephalic subclavian junction No comp Stable

## 2012-01-10 ENCOUNTER — Telehealth (HOSPITAL_COMMUNITY): Payer: Self-pay

## 2012-10-14 DIAGNOSIS — Z9289 Personal history of other medical treatment: Secondary | ICD-10-CM

## 2012-10-14 HISTORY — DX: Personal history of other medical treatment: Z92.89

## 2012-10-22 ENCOUNTER — Other Ambulatory Visit (HOSPITAL_COMMUNITY): Payer: Self-pay | Admitting: Cardiovascular Disease

## 2012-10-22 DIAGNOSIS — I2510 Atherosclerotic heart disease of native coronary artery without angina pectoris: Secondary | ICD-10-CM

## 2012-11-09 ENCOUNTER — Ambulatory Visit (HOSPITAL_COMMUNITY)
Admission: RE | Admit: 2012-11-09 | Discharge: 2012-11-09 | Disposition: A | Discharge status: Home / Self Care | Payer: Medicare Other | Source: Ambulatory Visit | Attending: Cardiovascular Disease | Admitting: Cardiovascular Disease

## 2012-11-09 DIAGNOSIS — I2510 Atherosclerotic heart disease of native coronary artery without angina pectoris: Secondary | ICD-10-CM | POA: Insufficient documentation

## 2012-11-09 MED ORDER — TECHNETIUM TC 99M SESTAMIBI GENERIC - CARDIOLITE
31.0000 | Freq: Once | INTRAVENOUS | Status: AC | PRN
  Administered 2012-11-09: 31 via INTRAVENOUS

## 2012-11-09 MED ORDER — TECHNETIUM TC 99M SESTAMIBI GENERIC - CARDIOLITE
10.6000 | Freq: Once | INTRAVENOUS | Status: AC | PRN
  Administered 2012-11-09: 11 via INTRAVENOUS

## 2012-11-09 MED ORDER — REGADENOSON 0.4 MG/5ML IV SOLN
0.4000 mg | Freq: Once | INTRAVENOUS | Status: AC
  Administered 2012-11-09: 0.4 mg via INTRAVENOUS

## 2012-11-09 NOTE — Procedures (Addendum)
[facility] [address] [place] [address] [place] [address] [phone]  Cardiology Nuclear Med Study  Megan Vasquez is a 71 y.o. female     MRN : [id number]     DOB: 1941/06/03  Procedure Date: 11/09/2012  Nuclear Med Background Indication for Stress Test:  Evaluation for Ischemia, Graft Patency and Abnormal EKG History:  Prior CABG Cardiac Risk Factors: Family History - CAD, History of Smoking, Hypertension, IDDM Type 2 and Lipids  Symptoms:  None   Nuclear Pre-Procedure Caffeine/Decaff Intake:  10:00pm NPO After: 9:00am   IV Site: R Hand  IV 0.9% NS with Angio Cath:  22g  Chest Size (in):  n/a IV Started by: Koren Shiver, CNMT  Height: 5\' 7"  (1.702 m)  Cup Size: B  BMI:  Body mass index is 25.06 kg/(m^2). Weight:  160 lb (72.576 kg)   Tech Comments:  n/a    Nuclear Med Study 1 or 2 day study: 1 day  Stress Test Type:  Lexiscan  Order Authorizing Provider:  Nanetta Batty, MD   Resting Radionuclide: Technetium 47m Sestamibi  Resting Radionuclide Dose: 10.6 mCi   Stress Radionuclide:  Technetium 26m Sestamibi  Stress Radionuclide Dose: 31.0 mCi           Stress Protocol Rest HR: 68 Stress HR:69  Rest BP: 152/74 Stress BP:139/56  Exercise Time (min): n/a METS: n/a          Dose of Adenosine (mg):  n/a Dose of Lexiscan: 0.4 mg  Dose of Atropine (mg): n/a Dose of Dobutamine: n/a mcg/kg/min (at max HR)  Stress Test Technologist: Ernestene Mention, CCT Nuclear Technologist: Koren Shiver, CNMT   Rest Procedure:  Myocardial perfusion imaging was performed at rest 45 minutes following the intravenous administration of Technetium 49m Sestamibi. Stress Procedure:  The patient received IV Lexiscan 0.4 mg over 15-seconds.  Technetium 82m Sestamibi injected at 30-seconds.  There were no significant changes with Lexiscan.  Quantitative spect images were obtained after a 45 minute delay.  Transient Ischemic Dilatation (Normal  <1.22):  1.05 Lung/Heart Ratio (Normal <0.45):  0.36 QGS EDV:  95  ml QGS ESV:  27 ml LV Ejection Fraction: 72%  Signed by    Rest ECG: Atrial Fibrilliation  Stress ECG: No significant change from baseline ECG  QPS Raw Data Images:  Normal; no motion artifact; normal heart/lung ratio. Stress Images:  Normal homogeneous uptake in all areas of the myocardium. Rest Images:  Normal homogeneous uptake in all areas of the myocardium. Subtraction (SDS):  No evidence of ischemia.  Impression Exercise Capacity:  Lexiscan with no exercise. BP Response:  Normal blood pressure response. Clinical Symptoms:  No significant symptoms noted. ECG Impression:  No significant ST segment change suggestive of ischemia. Comparison with Prior Nuclear Study: No significant change from previous study  Overall Impression:  Normal stress nuclear study.  LV Wall Motion:  NL LV Function; NL Wall Motion   Runell Gess, MD  11/09/2012 6:10 PM

## 2013-02-01 ENCOUNTER — Other Ambulatory Visit: Payer: Self-pay

## 2013-02-01 ENCOUNTER — Ambulatory Visit: Payer: Self-pay | Admitting: Cardiovascular Disease

## 2013-02-01 DIAGNOSIS — Z7901 Long term (current) use of anticoagulants: Secondary | ICD-10-CM | POA: Insufficient documentation

## 2013-02-01 DIAGNOSIS — Z1231 Encounter for screening mammogram for malignant neoplasm of breast: Secondary | ICD-10-CM

## 2013-02-01 DIAGNOSIS — I4891 Unspecified atrial fibrillation: Secondary | ICD-10-CM | POA: Insufficient documentation

## 2013-03-04 ENCOUNTER — Ambulatory Visit
Admission: RE | Admit: 2013-03-04 | Discharge: 2013-03-04 | Disposition: A | Discharge status: Home / Self Care | Payer: Medicare PPO | Source: Ambulatory Visit

## 2013-03-04 DIAGNOSIS — Z1231 Encounter for screening mammogram for malignant neoplasm of breast: Secondary | ICD-10-CM

## 2013-03-11 ENCOUNTER — Encounter: Payer: Self-pay | Admitting: Cardiovascular Disease

## 2013-04-04 ENCOUNTER — Ambulatory Visit (INDEPENDENT_AMBULATORY_CARE_PROVIDER_SITE_OTHER): Payer: Self-pay | Admitting: Pharmacist Clinician (PhC)/ Clinical Pharmacy Specialist

## 2013-04-04 DIAGNOSIS — I4891 Unspecified atrial fibrillation: Secondary | ICD-10-CM

## 2013-04-04 DIAGNOSIS — Z7901 Long term (current) use of anticoagulants: Secondary | ICD-10-CM

## 2013-04-25 LAB — PROTIME-INR: INR: 2.8 — AB (ref <=1.1)

## 2013-04-29 ENCOUNTER — Ambulatory Visit (INDEPENDENT_AMBULATORY_CARE_PROVIDER_SITE_OTHER): Payer: Self-pay | Admitting: Pharmacist Clinician (PhC)/ Clinical Pharmacy Specialist

## 2013-04-29 DIAGNOSIS — Z7901 Long term (current) use of anticoagulants: Secondary | ICD-10-CM

## 2013-04-29 DIAGNOSIS — I4891 Unspecified atrial fibrillation: Secondary | ICD-10-CM

## 2013-05-09 LAB — PROTIME-INR: INR: 2.3 — AB (ref <=1.1)

## 2013-05-13 ENCOUNTER — Ambulatory Visit (INDEPENDENT_AMBULATORY_CARE_PROVIDER_SITE_OTHER): Payer: Self-pay | Admitting: Pharmacist Clinician (PhC)/ Clinical Pharmacy Specialist

## 2013-05-13 DIAGNOSIS — I4891 Unspecified atrial fibrillation: Secondary | ICD-10-CM

## 2013-05-13 DIAGNOSIS — Z7901 Long term (current) use of anticoagulants: Secondary | ICD-10-CM

## 2013-05-27 ENCOUNTER — Ambulatory Visit: Payer: BC Managed Care – PPO | Admitting: Physician Assistant

## 2013-06-10 ENCOUNTER — Encounter: Payer: Self-pay | Admitting: Physician Assistant

## 2013-06-10 ENCOUNTER — Ambulatory Visit (INDEPENDENT_AMBULATORY_CARE_PROVIDER_SITE_OTHER): Payer: Medicare PPO | Admitting: Physician Assistant

## 2013-06-10 VITALS — BP 148/82 | Ht 67.0 in | Wt 153.5 lb

## 2013-06-10 DIAGNOSIS — I4891 Unspecified atrial fibrillation: Secondary | ICD-10-CM

## 2013-06-10 DIAGNOSIS — Z992 Dependence on renal dialysis: Secondary | ICD-10-CM | POA: Insufficient documentation

## 2013-06-10 DIAGNOSIS — E785 Hyperlipidemia, unspecified: Secondary | ICD-10-CM

## 2013-06-10 DIAGNOSIS — I10 Essential (primary) hypertension: Secondary | ICD-10-CM

## 2013-06-10 DIAGNOSIS — E119 Type 2 diabetes mellitus without complications: Secondary | ICD-10-CM

## 2013-06-10 DIAGNOSIS — N186 End stage renal disease: Secondary | ICD-10-CM

## 2013-06-10 NOTE — Assessment & Plan Note (Signed)
BP mildly elevated but improved over prior visit.

## 2013-06-10 NOTE — Progress Notes (Signed)
Date:  06/10/2013   ID:  Megan Vasquez, DOB 28-Dec-1940, MRN [id number]  PCP:  No primary provider on file.  Primary Cardiologist:  Allyson Sabal      History of Present Illness: Megan Vasquez is a 72 y.o. female The patient is a very pleasant 72 year old thin-appearing divorced African American female, mother of 3 and grandmother to 1 grandchild.  She has a history of CAD, status post coronary artery bypass grafting by Dr. Evelene Croon January 18, 2006, with a LIMA to her LAD, a vein to a diagonal branch, marginal branch of the circumflex, and distal RCA. Other history includes hypertension and hyperlipidemia. She does have chronic renal insufficiency, on hemodialysis since August 2001. Her postoperative course was complicated by atrial fibrillation with an embolic stroke. She has been on Coumadin anticoagulation since. Last Myoview 10/2012 revealed normal perfusion and LV function. The dialysis center apparently follows her lipid profile.   The patient presents today for a six month follow up.  She gets dialysis on T, Th, Sat.  She denies nausea, vomiting, fever, chest pain, shortness of breath, orthopnea, dizziness, PND, cough, congestion, abdominal pain, hematochezia, melena, lower extremity edema, claudication.  Wt Readings from Last 3 Encounters:  06/10/13 153 lb 8 oz (69.627 kg)  11/09/12 160 lb (72.576 kg)     History reviewed. No pertinent past medical history.  Current Outpatient Prescriptions  Medication Sig Dispense Refill  . Bisacodyl (LAXATIVE PO) Take by mouth.      . Calcium Carbonate (CALCIUM 600 PO) Take by mouth.      . cloNIDine (CATAPRES) 0.2 MG tablet Take 0.2 mg by mouth 2 (two) times daily. On diaylsis days only take 1 in the evening      . diltiazem (CARDIZEM CD) 240 MG 24 hr capsule Take 240 mg by mouth daily.      . insulin glargine (LANTUS) 100 UNIT/ML injection Inject into the skin at bedtime. 10 units twice a day      . levothyroxine (SYNTHROID, LEVOTHROID)  200 MCG tablet Take 200 mcg by mouth daily before breakfast.      . levothyroxine (SYNTHROID, LEVOTHROID) 25 MCG tablet Take 25 mcg by mouth daily before breakfast.      . metoprolol succinate (TOPROL-XL) 50 MG 24 hr tablet Take 1 and 1/2 tablets by mouth with or immediately following a meal.      . pravastatin (PRAVACHOL) 40 MG tablet Take 40 mg by mouth daily.      Marland Kitchen warfarin (COUMADIN) 2 MG tablet Take 2 mg by mouth daily.       No current facility-administered medications for this visit.    Allergies:   No Known Allergies  Social History:  The patient  reports that she quit smoking about 4 years ago. She does not have any smokeless tobacco history on file. She reports that she does not drink alcohol or use illicit drugs.   Family history:  History reviewed. No pertinent family history.  ROS:  Please see the history of present illness.  All other systems reviewed and negative.   PHYSICAL EXAM: VS:  BP 148/82  Ht 5\' 7"  (1.702 m)  Wt 153 lb 8 oz (69.627 kg)  BMI 24.04 kg/m2 Well nourished, well developed, in no acute distress HEENT: Pupils are equal round react to light accommodation extraocular movements are intact.  Neck: no JVDNo cervical lymphadenopathy. Cardiac: Irreg rate and rhythm.  2/6 sys MM loudest in the axillae. Lungs:  clear to auscultation bilaterally, no wheezing,  rhonchi or rales Abd: soft, nontender, positive bowel sounds all quadrants, no hepatosplenomegaly Ext: no lower extremity edema.  2+ radial and dorsalis pedis pulses. Skin: warm and dry Neuro:  Grossly normal  EKG:  Aflutter with variable block.  Rate 74 bpm  ASSESSMENT AND PLAN:  Problem List Items Addressed This Visit   HTN (hypertension)     BP mildly elevated but improved over prior visit.    Relevant Medications      warfarin (COUMADIN) 2 MG tablet      diltiazem (CARDIZEM CD) 240 MG 24 hr capsule      pravastatin (PRAVACHOL) 40 MG tablet      metoprolol succinate (TOPROL-XL) 50 MG 24 hr  tablet      cloNIDine (CATAPRES) 0.2 MG tablet   HLD (hyperlipidemia)   Relevant Medications      warfarin (COUMADIN) 2 MG tablet      diltiazem (CARDIZEM CD) 240 MG 24 hr capsule      pravastatin (PRAVACHOL) 40 MG tablet      metoprolol succinate (TOPROL-XL) 50 MG 24 hr tablet      cloNIDine (CATAPRES) 0.2 MG tablet   ESRD (end stage renal disease)   DM2 (diabetes mellitus, type 2)   Relevant Medications      pravastatin (PRAVACHOL) 40 MG tablet      insulin glargine (LANTUS) 100 UNIT/ML injection   Atrial fibrillation - Primary     Rate controlled and asymptomatic.  She takes coumadin and has her INR checked at [facility].    Relevant Medications      warfarin (COUMADIN) 2 MG tablet      diltiazem (CARDIZEM CD) 240 MG 24 hr capsule      pravastatin (PRAVACHOL) 40 MG tablet      metoprolol succinate (TOPROL-XL) 50 MG 24 hr tablet      cloNIDine (CATAPRES) 0.2 MG tablet   Other Relevant Orders      EKG 12-Lead

## 2013-06-10 NOTE — Patient Instructions (Signed)
Follow up with Dr. Berry in 6 months.  

## 2013-06-10 NOTE — Assessment & Plan Note (Signed)
Rate controlled and asymptomatic.  She takes coumadin and has her INR checked at [facility].

## 2013-06-16 ENCOUNTER — Other Ambulatory Visit: Payer: Self-pay | Admitting: Cardiovascular Disease

## 2013-06-17 NOTE — Telephone Encounter (Signed)
Rx was sent to pharmacy electronically. 

## 2013-06-21 ENCOUNTER — Encounter: Payer: Self-pay | Admitting: Cardiovascular Disease

## 2013-07-12 ENCOUNTER — Other Ambulatory Visit: Payer: Self-pay | Admitting: Cardiovascular Disease

## 2013-07-12 NOTE — Telephone Encounter (Signed)
Rx was sent to pharmacy electronically. Please advise Clonidine rx.

## 2013-07-17 ENCOUNTER — Ambulatory Visit (INDEPENDENT_AMBULATORY_CARE_PROVIDER_SITE_OTHER): Payer: Medicare PPO | Admitting: Pharmacist Clinician (PhC)/ Clinical Pharmacy Specialist

## 2013-07-17 DIAGNOSIS — Z7901 Long term (current) use of anticoagulants: Secondary | ICD-10-CM

## 2013-07-17 DIAGNOSIS — I4891 Unspecified atrial fibrillation: Secondary | ICD-10-CM

## 2013-08-09 ENCOUNTER — Ambulatory Visit (INDEPENDENT_AMBULATORY_CARE_PROVIDER_SITE_OTHER): Payer: Medicare PPO | Admitting: Pharmacist Clinician (PhC)/ Clinical Pharmacy Specialist

## 2013-08-09 DIAGNOSIS — Z7901 Long term (current) use of anticoagulants: Secondary | ICD-10-CM

## 2013-08-09 DIAGNOSIS — I4891 Unspecified atrial fibrillation: Secondary | ICD-10-CM

## 2013-08-28 ENCOUNTER — Other Ambulatory Visit: Payer: Self-pay | Admitting: Cardiovascular Disease

## 2013-08-28 NOTE — Telephone Encounter (Signed)
Rx was sent to pharmacy electronically. 

## 2013-08-28 NOTE — Telephone Encounter (Signed)
Her mom totally out of warfarin  Says pharmacy has submitted several times. Please call

## 2013-09-05 LAB — PROTIME-INR: INR: 2.4 — AB (ref <=1.1)

## 2013-09-06 ENCOUNTER — Ambulatory Visit (INDEPENDENT_AMBULATORY_CARE_PROVIDER_SITE_OTHER): Payer: Medicare PPO | Admitting: Pharmacist Clinician (PhC)/ Clinical Pharmacy Specialist

## 2013-09-06 DIAGNOSIS — I4891 Unspecified atrial fibrillation: Secondary | ICD-10-CM

## 2013-09-06 DIAGNOSIS — Z7901 Long term (current) use of anticoagulants: Secondary | ICD-10-CM

## 2013-09-23 ENCOUNTER — Encounter: Payer: Self-pay | Admitting: Podiatry

## 2013-09-23 ENCOUNTER — Ambulatory Visit (INDEPENDENT_AMBULATORY_CARE_PROVIDER_SITE_OTHER): Payer: Medicare PPO | Admitting: Podiatry

## 2013-09-23 VITALS — BP 102/82 | HR 81 | Resp 16

## 2013-09-23 DIAGNOSIS — B351 Tinea unguium: Secondary | ICD-10-CM

## 2013-09-23 DIAGNOSIS — E1159 Type 2 diabetes mellitus with other circulatory complications: Secondary | ICD-10-CM

## 2013-09-23 DIAGNOSIS — M79609 Pain in unspecified limb: Secondary | ICD-10-CM

## 2013-09-23 NOTE — Progress Notes (Signed)
°  Subjective:    Patient ID: Megan Vasquez, female    DOB: 01-Jan-1941, 72 y.o.   MRN: [id number]  HPItrim my nails and legs are swelling on both and I go to dialysis and I dont drink that much water    Review of Systems  Constitutional: Negative.   HENT: Negative.   Eyes: Negative.   Respiratory: Negative.   Cardiovascular: Negative.   Gastrointestinal: Negative.   Endocrine: Positive for cold intolerance.  Genitourinary: Negative.   Musculoskeletal: Positive for back pain.  Skin: Negative.   Allergic/Immunologic: Negative.   Neurological: Negative.   Hematological: Negative.   Psychiatric/Behavioral: Negative.        Objective:   Physical Exam        Assessment & Plan:

## 2013-09-24 NOTE — Progress Notes (Signed)
Subjective:     Patient ID: Megan Vasquez, female   DOB: [birth year], 72 y.o.   MRN: [id number]  HPI patient states I need my nails cut they're getting very tender and also I have some pain in my left foot. Patient points to the left big toe which has discoloration   Review of Systems  All other systems reviewed and are negative.       Objective:   Physical Exam  Constitutional: She is oriented to person, place, and time.  Neurological: She is oriented to person, place, and time.  Skin: Skin is dry.   patient is found to have elongated nail bed 1-5 bilateral with thick debris that is painful when pressed diminishment pulses left over right foot with coolness to the left foot and discoloration of the left big    Assessment:     Painful symptomatic mycotic toenails 1-5 both feet. Possibility for vascular disease of the left lower extremity over right    Plan:     Referred patient to [facility] for evaluation of arterial flow and debrided nailbeds 1-5 bilateral with no iatrogenic bleeding noted

## 2013-10-02 ENCOUNTER — Ambulatory Visit (HOSPITAL_COMMUNITY)
Admission: RE | Admit: 2013-10-02 | Discharge: 2013-10-02 | Disposition: A | Discharge status: Home / Self Care | Payer: Medicare PPO | Source: Ambulatory Visit | Attending: Podiatry | Admitting: Podiatry

## 2013-10-02 ENCOUNTER — Telehealth: Payer: Self-pay | Admitting: *Deleted

## 2013-10-02 DIAGNOSIS — E119 Type 2 diabetes mellitus without complications: Secondary | ICD-10-CM | POA: Insufficient documentation

## 2013-10-02 DIAGNOSIS — M79609 Pain in unspecified limb: Secondary | ICD-10-CM

## 2013-10-02 DIAGNOSIS — I70209 Unspecified atherosclerosis of native arteries of extremities, unspecified extremity: Secondary | ICD-10-CM | POA: Insufficient documentation

## 2013-10-02 DIAGNOSIS — E1159 Type 2 diabetes mellitus with other circulatory complications: Secondary | ICD-10-CM

## 2013-10-02 HISTORY — PX: OTHER SURGICAL HISTORY: SHX169

## 2013-10-02 NOTE — Progress Notes (Signed)
Lower Extremity Arterial Duplex Completed. °Brianna L Mazza,RVT °

## 2013-10-02 NOTE — Telephone Encounter (Signed)
Megan Vasquez states pt's arterial dopplers were abnormal, would Dr Charlsie Merles like Dr Allyson Sabal to consult.  I left message on Megan Vasquez's voicemail to definitely set up pt with Dr Allyson Sabal.

## 2013-10-08 ENCOUNTER — Ambulatory Visit (INDEPENDENT_AMBULATORY_CARE_PROVIDER_SITE_OTHER): Payer: Medicare PPO | Admitting: Pharmacist Clinician (PhC)/ Clinical Pharmacy Specialist

## 2013-10-08 DIAGNOSIS — Z7901 Long term (current) use of anticoagulants: Secondary | ICD-10-CM

## 2013-10-08 DIAGNOSIS — I4891 Unspecified atrial fibrillation: Secondary | ICD-10-CM

## 2013-10-17 ENCOUNTER — Encounter: Payer: Self-pay | Admitting: *Deleted

## 2013-10-18 ENCOUNTER — Ambulatory Visit (INDEPENDENT_AMBULATORY_CARE_PROVIDER_SITE_OTHER): Payer: Medicare PPO | Admitting: Cardiovascular Disease

## 2013-10-18 ENCOUNTER — Encounter: Payer: Self-pay | Admitting: Cardiovascular Disease

## 2013-10-18 VITALS — BP 137/76 | HR 96 | Ht 67.0 in | Wt 147.0 lb

## 2013-10-18 DIAGNOSIS — I739 Peripheral vascular disease, unspecified: Secondary | ICD-10-CM | POA: Insufficient documentation

## 2013-10-18 DIAGNOSIS — I10 Essential (primary) hypertension: Secondary | ICD-10-CM

## 2013-10-18 DIAGNOSIS — I4891 Unspecified atrial fibrillation: Secondary | ICD-10-CM

## 2013-10-18 DIAGNOSIS — E785 Hyperlipidemia, unspecified: Secondary | ICD-10-CM

## 2013-10-18 DIAGNOSIS — I2510 Atherosclerotic heart disease of native coronary artery without angina pectoris: Secondary | ICD-10-CM | POA: Insufficient documentation

## 2013-10-18 NOTE — Progress Notes (Signed)
10/18/2013 Megan Vasquez   [birth year]  [id number]  Primary Physician [name], MD Primary Cardiologist: Runell Gess MD [name], [place]   HPI:  Megan Vasquez is a 72y.o. female The patient is a very pleasant 71 year old thin-appearing divorced African American female, mother of 3 and grandmother to 1 grandchild. She has a history of CAD, status post coronary artery bypass grafting by Dr. Evelene Croon January 18, 2006, with a LIMA to her LAD, a vein to a diagonal branch, marginal branch of the circumflex, and distal RCA. Other history includes hypertension and hyperlipidemia. She does have chronic renal insufficiency, on hemodialysis since August 2001. Her postoperative course was complicated by atrial fibrillation with an embolic stroke. She has been on Coumadin anticoagulation since. Last Myoview 10/2012 revealed normal perfusion and LV function. The dialysis center apparently follows her lipid profile.  The patient presents today for a six month follow up. She gets dialysis on T, Th, Sat. She denies nausea, vomiting, fever, chest pain, shortness of breath, orthopnea, dizziness, PND, cough, congestion, abdominal pain, hematochezia, melena, lower extremity edema, claudication. She saw [facility] in the office 06/10/13. She's done well since and specifically denies chest pain, shortness of breath or claudication.    Current Outpatient Prescriptions  Medication Sig Dispense Refill  . Bisacodyl (LAXATIVE PO) Take by mouth.      . Calcium Carbonate (CALCIUM 600 PO) Take by mouth.      . cloNIDine (CATAPRES) 0.2 MG tablet TAKE 1 TABLET BY MOUTH TWICE A DAY  60 tablet  6  . diltiazem (CARDIZEM CD) 240 MG 24 hr capsule TAKE 1 CAPSULE EVERY DAY  30 capsule  11  . insulin glargine (LANTUS) 100 UNIT/ML injection Inject into the skin at bedtime. 10 units twice a day      . levothyroxine (SYNTHROID, LEVOTHROID) 200 MCG tablet Take 200 mcg by mouth daily before breakfast.       . levothyroxine (SYNTHROID, LEVOTHROID) 25 MCG tablet Take 25 mcg by mouth daily before breakfast.      . metoprolol succinate (TOPROL-XL) 50 MG 24 hr tablet Take 1 and 1/2 tablets by mouth with or immediately following a meal.      . Multiple Vitamin (MULTIVITAMIN) capsule Take 1 capsule by mouth daily.      . pravastatin (PRAVACHOL) 40 MG tablet Take 40 mg by mouth daily.      Marland Kitchen warfarin (COUMADIN) 2 MG tablet Take daily as directed per INR.  60 tablet  2   No current facility-administered medications for this visit.    No Known Allergies  History   Social History  . Marital Status: Divorced    Spouse Name: N/A    Number of Children: 3  . Years of Education: 12   Occupational History  .     Social History Main Topics  . Smoking status: Former Smoker    Quit date: 06/10/2009  . Smokeless tobacco: Never Used  . Alcohol Use: No  . Drug Use: No  . Sexual Activity: Not on file   Other Topics Concern  . Not on file   Social History Narrative  . No narrative on file     Review of Systems: General: negative for chills, fever, night sweats or weight changes.  Cardiovascular: negative for chest pain, dyspnea on exertion, edema, orthopnea, palpitations, paroxysmal nocturnal dyspnea or shortness of breath Dermatological: negative for rash Respiratory: negative for cough or wheezing Urologic: negative for hematuria Abdominal: negative for nausea, vomiting,  diarrhea, bright red blood per rectum, melena, or hematemesis Neurologic: negative for visual changes, syncope, or dizziness All other systems reviewed and are otherwise negative except as noted above.    Blood pressure 137/76, pulse 96, height 5\' 7"  (1.702 m), weight 147 lb (66.679 kg).  General appearance: alert and no distress Neck: no adenopathy, no JVD, supple, symmetrical, trachea midline, thyroid not enlarged, symmetric, no tenderness/mass/nodules and loud left, softer right carotid bruit Lungs: clear to  auscultation bilaterally Heart: irregularly irregular rhythm Extremities: extremities normal, atraumatic, no cyanosis or edema  EKG atrial fibrillation with a ventricular response of 73  ASSESSMENT AND PLAN:   Atrial fibrillation Rate controlled on Coumadin anticoagulation  Coronary artery disease Status post coronary artery bypass grafting by Dr. Rexanne Mano 01/18/06 with a LIMA to the LAD, vein to diagonal branch, marginal branch of the circumflex and distal RCA. She had a Myoview stress test performed 11/09/12 which was nonischemic. She denies chest pain or shortness of breath.  Peripheral arterial disease Recent lower extremity arterial Dopplers performed in our office 10/02/13 revealed occluded tibial vessels bilaterally with a high-frequency signal in the mid right SFA. I believe she is high risk to have any podiatry procedures with regards to healing. She really denies claudication.  HTN (hypertension) Well-controlled on current medications  HLD (hyperlipidemia) On statin therapy followed by her PCP      Runell Gess MD [facility], [facility] 10/18/2013 11:36 AM

## 2013-10-18 NOTE — Assessment & Plan Note (Signed)
On statin therapy followed by her PCP 

## 2013-10-18 NOTE — Assessment & Plan Note (Signed)
Recent lower extremity arterial Dopplers performed in our office 10/02/13 revealed occluded tibial vessels bilaterally with a high-frequency signal in the mid right SFA. I believe she is high risk to have any podiatry procedures with regards to healing. She really denies claudication.

## 2013-10-18 NOTE — Assessment & Plan Note (Signed)
Well-controlled on current medications 

## 2013-10-18 NOTE — Assessment & Plan Note (Signed)
Status post coronary artery bypass grafting by Dr. Rexanne Mano 01/18/06 with a LIMA to the LAD, vein to diagonal branch, marginal branch of the circumflex and distal RCA. She had a Myoview stress test performed 11/09/12 which was nonischemic. She denies chest pain or shortness of breath.

## 2013-10-18 NOTE — Patient Instructions (Signed)
CONTINUE WITH CURRENT MEDICATION  Your physician wants you to follow-up in 6 MONTH with Bryan and 12 months with Dr Allyson Sabal.  You will receive a reminder letter in the mail two months in advance. If you don't receive a letter, please call our office to schedule the follow-up appointment.

## 2013-10-18 NOTE — Assessment & Plan Note (Signed)
Rate controlled on Coumadin anticoagulation 

## 2013-10-21 ENCOUNTER — Encounter: Payer: Self-pay | Admitting: Cardiovascular Disease

## 2013-10-24 ENCOUNTER — Ambulatory Visit (INDEPENDENT_AMBULATORY_CARE_PROVIDER_SITE_OTHER): Payer: Medicare PPO | Admitting: Pharmacist Clinician (PhC)/ Clinical Pharmacy Specialist

## 2013-10-24 DIAGNOSIS — I4891 Unspecified atrial fibrillation: Secondary | ICD-10-CM

## 2013-10-24 DIAGNOSIS — Z7901 Long term (current) use of anticoagulants: Secondary | ICD-10-CM

## 2013-11-04 ENCOUNTER — Ambulatory Visit (INDEPENDENT_AMBULATORY_CARE_PROVIDER_SITE_OTHER): Payer: Medicare PPO | Admitting: Pharmacist Clinician (PhC)/ Clinical Pharmacy Specialist

## 2013-11-04 DIAGNOSIS — Z7901 Long term (current) use of anticoagulants: Secondary | ICD-10-CM

## 2013-11-04 DIAGNOSIS — I4891 Unspecified atrial fibrillation: Secondary | ICD-10-CM

## 2013-11-28 LAB — PROTIME-INR: INR: 2.2 — AB (ref <=1.1)

## 2013-12-03 ENCOUNTER — Other Ambulatory Visit: Payer: Self-pay | Admitting: Cardiovascular Disease

## 2013-12-03 ENCOUNTER — Ambulatory Visit (INDEPENDENT_AMBULATORY_CARE_PROVIDER_SITE_OTHER): Payer: Medicare PPO | Admitting: Pharmacist Clinician (PhC)/ Clinical Pharmacy Specialist

## 2013-12-03 DIAGNOSIS — I4891 Unspecified atrial fibrillation: Secondary | ICD-10-CM

## 2013-12-03 DIAGNOSIS — Z7901 Long term (current) use of anticoagulants: Secondary | ICD-10-CM

## 2013-12-03 NOTE — Telephone Encounter (Signed)
Rx was sent to pharmacy electronically. 

## 2013-12-12 LAB — PROTIME-INR: INR: 1.6 — AB (ref <=1.1)

## 2013-12-14 ENCOUNTER — Other Ambulatory Visit: Payer: Self-pay | Admitting: Cardiovascular Disease

## 2013-12-16 ENCOUNTER — Ambulatory Visit (INDEPENDENT_AMBULATORY_CARE_PROVIDER_SITE_OTHER): Payer: Medicare PPO | Admitting: Pharmacist Clinician (PhC)/ Clinical Pharmacy Specialist

## 2013-12-16 DIAGNOSIS — Z7901 Long term (current) use of anticoagulants: Secondary | ICD-10-CM

## 2013-12-16 DIAGNOSIS — I4891 Unspecified atrial fibrillation: Secondary | ICD-10-CM

## 2013-12-23 ENCOUNTER — Ambulatory Visit (INDEPENDENT_AMBULATORY_CARE_PROVIDER_SITE_OTHER): Payer: Medicare PPO | Admitting: Podiatry

## 2013-12-23 ENCOUNTER — Encounter: Payer: Self-pay | Admitting: Podiatry

## 2013-12-23 VITALS — BP 132/100 | HR 64 | Resp 12

## 2013-12-23 DIAGNOSIS — E1159 Type 2 diabetes mellitus with other circulatory complications: Secondary | ICD-10-CM

## 2013-12-23 DIAGNOSIS — B351 Tinea unguium: Secondary | ICD-10-CM

## 2013-12-23 DIAGNOSIS — I2510 Atherosclerotic heart disease of native coronary artery without angina pectoris: Secondary | ICD-10-CM

## 2013-12-23 DIAGNOSIS — M79609 Pain in unspecified limb: Secondary | ICD-10-CM

## 2013-12-23 DIAGNOSIS — L97509 Non-pressure chronic ulcer of other part of unspecified foot with unspecified severity: Secondary | ICD-10-CM

## 2013-12-23 NOTE — Progress Notes (Signed)
   Subjective:    Patient ID: Megan Vasquez, female    DOB: 01/23/1941, 73 y.o.   MRN: [id number]  HPI  '' INJURED THE LT FOOT 3RD TOE 2 WEEKS AGO, BUT IT DOES NOT HURT. TREATMENT TRIED ALCOHOL AND NEOSPORIN TWICE A NIGHT.    Review of Systems     Objective:   Physical Exam        Assessment & Plan:

## 2013-12-24 NOTE — Progress Notes (Signed)
Subjective:     Patient ID: Megan MaudlinMarjorie Ciesla, female   DOB: 04/16/1941, 73 y.o.   MRN: [id number]  HPI patient states she injured a couple toes on her left foot but they're feeling better and not sore anymore and she has nails that are thick and become painful 1-5 both feet   Review of Systems     Objective:   Physical Exam No change in neurovascular status with thick nail disease 1-5 both feet with mild discoloration of the third toe left localized with no indications of proximal issues    Assessment:     Mycotic nail infection with pain 1-5 both feet and trauma to the left foot    Plan:     Advised on wide shoes and debrided nailbeds 1-5 both feet with no iatrogenic bleeding noted

## 2013-12-26 LAB — PROTIME-INR: INR: 2122015 — AB (ref 0.9–1.1)

## 2013-12-27 ENCOUNTER — Ambulatory Visit (INDEPENDENT_AMBULATORY_CARE_PROVIDER_SITE_OTHER): Payer: Medicare PPO | Admitting: Pharmacist Clinician (PhC)/ Clinical Pharmacy Specialist

## 2013-12-27 ENCOUNTER — Telehealth: Payer: Self-pay | Admitting: Cardiovascular Disease

## 2013-12-27 DIAGNOSIS — I4891 Unspecified atrial fibrillation: Secondary | ICD-10-CM

## 2013-12-27 DIAGNOSIS — Z7901 Long term (current) use of anticoagulants: Secondary | ICD-10-CM

## 2013-12-27 LAB — POCT INR: INR: 3

## 2013-12-27 NOTE — Telephone Encounter (Signed)
See anticoag note

## 2013-12-27 NOTE — Telephone Encounter (Signed)
Per Nettie ElmSylvia, call taken by Phillips HayKristin Alvstad, PharmD.

## 2013-12-27 NOTE — Telephone Encounter (Signed)
No document, from me

## 2014-01-09 LAB — PROTIME-INR: INR: 1.3 — AB (ref 0.9–1.1)

## 2014-01-13 ENCOUNTER — Ambulatory Visit: Payer: Medicare PPO | Admitting: Podiatry

## 2014-01-14 ENCOUNTER — Ambulatory Visit (INDEPENDENT_AMBULATORY_CARE_PROVIDER_SITE_OTHER): Payer: Medicare PPO | Admitting: Pharmacist Clinician (PhC)/ Clinical Pharmacy Specialist

## 2014-01-14 DIAGNOSIS — Z7901 Long term (current) use of anticoagulants: Secondary | ICD-10-CM

## 2014-01-14 DIAGNOSIS — I4891 Unspecified atrial fibrillation: Secondary | ICD-10-CM

## 2014-01-15 ENCOUNTER — Ambulatory Visit (INDEPENDENT_AMBULATORY_CARE_PROVIDER_SITE_OTHER): Payer: Medicare PPO | Admitting: Podiatry

## 2014-01-15 ENCOUNTER — Encounter: Payer: Self-pay | Admitting: Podiatry

## 2014-01-15 VITALS — BP 136/64 | HR 86 | Resp 12

## 2014-01-15 DIAGNOSIS — I739 Peripheral vascular disease, unspecified: Secondary | ICD-10-CM

## 2014-01-15 DIAGNOSIS — L03039 Cellulitis of unspecified toe: Secondary | ICD-10-CM

## 2014-01-15 DIAGNOSIS — L02619 Cutaneous abscess of unspecified foot: Secondary | ICD-10-CM

## 2014-01-15 MED ORDER — CEPHALEXIN 500 MG PO CAPS: 500.0000 mg | ORAL_CAPSULE | Freq: Two times a day (BID) | ORAL | Status: DC

## 2014-01-15 NOTE — Progress Notes (Signed)
   Subjective:    Patient ID: Megan MaudlinMarjorie Vasquez, female    DOB: 12/18/1940, 73 y.o.   MRN: [id number]  HPI PT STATED LAST TIME DR. REGAL CUT THE TOENAILS START SORE AND HAVING DRAINAGE. THE TOES GET AGGRAVATED BY PUTTING PRESSURE AND GET WORSE AT NIGHT WITH THE THROBBING PAIN. TRIED TO POUR SOME ALCOHOL ONCE A DAY, BUT IDOES NOT HELP.    Review of Systems     Objective:   Physical Exam        Assessment & Plan:

## 2014-01-16 NOTE — Progress Notes (Signed)
Subjective:     Patient ID: Megan MaudlinMarjorie Vasquez, female   DOB: 11/24/1940, 73 y.o.   MRN: [id number]  HPI patient presents with poor circulation and nailbeds there irritated even after trimming which was done several weeks ago   Review of Systems     Objective:   Physical Exam Significant diminishment of vascular status with nail disease 1-5 both feet with nailbeds not currently bleeding but very sore    Assessment:     Very worried about circulation as the cause of the patient's pain and explained caregiver    Plan:     Scheduled for vascular exam and today did place her on antibiotic Keflex to try to give relief of symptoms and advised if the toe should start to turn dark she needs to go to the emergency room and it is possible that she may end up with amputation

## 2014-01-20 ENCOUNTER — Telehealth (HOSPITAL_COMMUNITY): Payer: Self-pay | Admitting: *Deleted

## 2014-01-24 ENCOUNTER — Ambulatory Visit (HOSPITAL_COMMUNITY)
Admission: RE | Admit: 2014-01-24 | Discharge: 2014-01-24 | Disposition: A | Discharge status: Home / Self Care | Payer: Medicare PPO | Source: Ambulatory Visit | Attending: Podiatry | Admitting: Podiatry

## 2014-01-24 DIAGNOSIS — I739 Peripheral vascular disease, unspecified: Secondary | ICD-10-CM | POA: Insufficient documentation

## 2014-01-24 DIAGNOSIS — I70219 Atherosclerosis of native arteries of extremities with intermittent claudication, unspecified extremity: Secondary | ICD-10-CM

## 2014-01-24 DIAGNOSIS — R0989 Other specified symptoms and signs involving the circulatory and respiratory systems: Secondary | ICD-10-CM

## 2014-01-24 NOTE — Progress Notes (Signed)
Arterial Duplex Lower Ext. Completed. Ceaser Ebeling, BS, RDMS, RVT  

## 2014-01-27 ENCOUNTER — Other Ambulatory Visit: Payer: Self-pay | Admitting: *Deleted

## 2014-01-27 MED ORDER — CLONIDINE HCL 0.2 MG PO TABS: ORAL_TABLET | ORAL | Status: DC

## 2014-01-27 NOTE — Telephone Encounter (Signed)
Rx was sent to pharmacy electronically. 

## 2014-01-30 LAB — PROTIME-INR
INR: 1.3 — AB (ref 0.9–1.1)
INR: 1.3 — AB (ref <=1.1)

## 2014-02-04 ENCOUNTER — Ambulatory Visit (INDEPENDENT_AMBULATORY_CARE_PROVIDER_SITE_OTHER): Payer: Medicare PPO | Admitting: Pharmacist Clinician (PhC)/ Clinical Pharmacy Specialist

## 2014-02-04 DIAGNOSIS — I4891 Unspecified atrial fibrillation: Secondary | ICD-10-CM

## 2014-02-04 DIAGNOSIS — Z7901 Long term (current) use of anticoagulants: Secondary | ICD-10-CM

## 2014-02-05 ENCOUNTER — Telehealth: Payer: Self-pay | Admitting: *Deleted

## 2014-02-05 MED ORDER — WARFARIN SODIUM 3 MG PO TABS: ORAL_TABLET | ORAL | Status: DC

## 2014-03-03 ENCOUNTER — Other Ambulatory Visit: Payer: Self-pay

## 2014-03-03 DIAGNOSIS — Z1231 Encounter for screening mammogram for malignant neoplasm of breast: Secondary | ICD-10-CM

## 2014-03-05 ENCOUNTER — Encounter (INDEPENDENT_AMBULATORY_CARE_PROVIDER_SITE_OTHER): Payer: Self-pay

## 2014-03-05 ENCOUNTER — Ambulatory Visit
Admission: RE | Admit: 2014-03-05 | Discharge: 2014-03-05 | Disposition: A | Discharge status: Home / Self Care | Payer: Medicare PPO | Source: Ambulatory Visit

## 2014-03-05 DIAGNOSIS — Z1231 Encounter for screening mammogram for malignant neoplasm of breast: Secondary | ICD-10-CM

## 2014-03-06 ENCOUNTER — Encounter: Payer: Self-pay | Admitting: Vascular Surgery

## 2014-03-06 LAB — PROTIME-INR

## 2014-03-07 ENCOUNTER — Encounter: Payer: Medicare PPO | Admitting: Vascular Surgery

## 2014-03-07 LAB — PROTIME-INR: INR: 1.7 — AB (ref <=1.1)

## 2014-03-10 ENCOUNTER — Telehealth: Payer: Self-pay | Admitting: Vascular Surgery

## 2014-03-10 NOTE — Telephone Encounter (Signed)
Message copied by Fredrich BirksMILLIKAN, DANA P on Mon Mar 10, 2014  1:53 PM ------      Message from: Phillips OdorPULLINS, CAROL S      Created: Mon Mar 10, 2014 12:57 PM      Regarding: requesting earlier appt       Denny Peonrin, pt's daughter-in-law called, requesting an earlier appt. than 5/18;  any options?  It looks like it is MD only appt.  Can you let her know if we don't have anything, and place on a cancellation list?   Her # is [phone]/ Thanks. ------

## 2014-03-11 ENCOUNTER — Ambulatory Visit (INDEPENDENT_AMBULATORY_CARE_PROVIDER_SITE_OTHER): Payer: Medicare PPO | Admitting: Pharmacist Clinician (PhC)/ Clinical Pharmacy Specialist

## 2014-03-11 DIAGNOSIS — I4891 Unspecified atrial fibrillation: Secondary | ICD-10-CM

## 2014-03-11 DIAGNOSIS — Z7901 Long term (current) use of anticoagulants: Secondary | ICD-10-CM

## 2014-03-17 NOTE — Telephone Encounter (Signed)
Encounter Closed---03/17/14 TP 

## 2014-03-18 ENCOUNTER — Encounter: Payer: Self-pay | Admitting: Vascular Surgery

## 2014-03-19 ENCOUNTER — Encounter: Payer: Self-pay | Admitting: Vascular Surgery

## 2014-03-19 ENCOUNTER — Other Ambulatory Visit: Payer: Self-pay | Admitting: *Deleted

## 2014-03-19 ENCOUNTER — Ambulatory Visit (INDEPENDENT_AMBULATORY_CARE_PROVIDER_SITE_OTHER): Payer: Medicare PPO | Admitting: Vascular Surgery

## 2014-03-19 ENCOUNTER — Encounter: Payer: Self-pay | Admitting: *Deleted

## 2014-03-19 VITALS — BP 118/72 | HR 82 | Resp 14 | Ht 67.0 in | Wt 145.0 lb

## 2014-03-19 DIAGNOSIS — L98499 Non-pressure chronic ulcer of skin of other sites with unspecified severity: Secondary | ICD-10-CM

## 2014-03-19 DIAGNOSIS — I70209 Unspecified atherosclerosis of native arteries of extremities, unspecified extremity: Secondary | ICD-10-CM | POA: Insufficient documentation

## 2014-03-19 DIAGNOSIS — I739 Peripheral vascular disease, unspecified: Secondary | ICD-10-CM

## 2014-03-19 NOTE — Assessment & Plan Note (Signed)
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 Based on her exam, she has evidence of significant infrainguinal arterial occlusive disease bilaterally. Given her diabetes, peripheral vascular disease, and nonhealing wounds, she is at significant risk for limb loss. Recommended we proceed with arteriography to see what options she has for revascularization. He dialyzes on Tuesdays Thursdays and Saturdays we'll schedule this for Monday. We will have to stop her Coumadin 5 days prior to the procedure.  I have reviewed with the patient the indications for arteriography. In addition, I have reviewed the potential complications of arteriography including but not limited to: Bleeding, arterial injury, arterial thrombosis, dye action, renal insufficiency, or other unpredictable medical problems. I have explained to the patient that if we find disease amenable to angioplasty we could potentially address this at the same time. I have discussed the potential complications of angioplasty and stenting, including but not limited to: Bleeding, arterial thrombosis, arterial injury, dissection, or the need for surgical intervention. We will make further recommendations pending these results. Of note if she is a candidate for a bypass she would need to undergo vein mapping preoperatively. Based on her exam it looks like she's had some vein taken from both lower extremities.

## 2014-03-19 NOTE — Progress Notes (Signed)
 [facility]ist of [place]  Patient name: Megan Vasquez MRN: [id number] DOB: 09/21/1941 Sex: female  REASON FOR CONSULT: Evaluate peripheral vascular disease. Referred by Dr. Cynthia Dunham.  HPI: Megan Vasquez is a 72 y.o. female who had a toenail trimmed from her right great toe approximately a month ago. The patient states that she developed a wound which has been slow to heal.  Of note prior to this she was ambulatory with a walker. I do not get any history of claudication although her activity I suspect is very limited. She denies any history of rest pain. She denies any history of previous nonhealing ulcers.  She is on Coumadin for atrial flutter ablation. She also has end-stage renal disease and dialyzes with a left upper arm fistula on Tuesdays Thursdays and Saturdays. She has undergone previous CABG in 2007 but it is not clear which leg the vein was taken from.   Past Medical History  Diagnosis Date  . Diabetes mellitus without complication   . Hypertension   . Hyperlipidemia   . Atrial fibrillation   . ESRD (end stage renal disease)     HD since 06/2000  . CAD (coronary artery disease)   . 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 History of nuclear stress test 10/2012    lexiscan; normal stress test; post-stress EF72%  . Peripheral arterial disease    Family History  Problem Relation Age of Onset  . Heart disease Mother   . Heart attack Brother   . Breast cancer Sister   . Diabetes Sister   . Heart attack Sister   . Diabetes Child    SOCIAL HISTORY: History  Substance Use Topics  . Smoking status: Former Smoker    Quit date: 06/10/2009  . Smokeless tobacco: Never Used  . Alcohol Use: No   No Known Allergies Current Outpatient Prescriptions  Medication Sig Dispense Refill  . Bisacodyl (LAXATIVE PO) Take by  mouth.      . calcium acetate (PHOSLO) 667 MG capsule       . Calcium Carbonate (CALCIUM 600 PO) Take by mouth.      . cephALEXin (KEFLEX) 500 MG capsule Take 1 capsule (500 mg total) by mouth 2 (two) times daily.  20 capsule  1  . cloNIDine (CATAPRES) 0.2 MG tablet TAKE 1 TABLET BY MOUTH TWICE A DAY  60 tablet  6  . diltiazem (CARDIZEM CD) 240 MG 24 hr capsule TAKE 1 CAPSULE EVERY DAY  30 capsule  11  . insulin glargine (LANTUS) 100 UNIT/ML injection Inject into the skin at bedtime. 10 units twice a day      . levothyroxine (SYNTHROID, LEVOTHROID) 200 MCG tablet Take 200 mcg by mouth daily before breakfast.      . levothyroxine (SYNTHROID, LEVOTHROID) 25 MCG tablet Take 25 mcg by mouth daily before breakfast.      . metoprolol succinate (TOPROL-XL) 50 MG 24 hr tablet TAKE 1 AND 1/2 TABLET DAILY  45 tablet  11  . Multiple Vitamin (MULTIVITAMIN) capsule Take 1 capsule by mouth daily.      . pravastatin (PRAVACHOL) 40 MG tablet Take 40 mg by mouth daily.      . warfarin (COUMADIN) 3 MG tablet Take 1 to 1 & 1/2 tablets by mouth daily as directed  40 tablet  1     No current facility-administered medications for this visit.   REVIEW OF SYSTEMS: [X ] denotes positive finding; [  ] denotes negative finding  CARDIOVASCULAR:  [ ] chest pain   [ ] chest pressure   [ ] palpitations   [ ] orthopnea   [ ] dyspnea on exertion   [ ] claudication   [ ] rest pain   [ ] DVT   [ ] phlebitis PULMONARY:   [ ] productive cough   [ ] asthma   [ ] wheezing NEUROLOGIC:   [ ] weakness  [ ] paresthesias  [ ] aphasia  [ ] amaurosis  [ ] dizziness HEMATOLOGIC:   [ ] bleeding problems   [ ] clotting disorders MUSCULOSKELETAL:  [ ] joint pain   [ ] joint swelling [X ] leg swelling GASTROINTESTINAL: [ ]  blood in stool  [ ]  hematemesis GENITOURINARY:  [ ]  dysuria  [ ]  hematuria PSYCHIATRIC:  [ ] history of major depression INTEGUMENTARY:  [ ] rashes  [ ] ulcers CONSTITUTIONAL:  [ ] fever   [ ] chills  PHYSICAL  EXAM: Filed Vitals:   03/19/14 1301  BP: 118/72  Pulse: 82  Resp: 14  Height: 5' 7" (1.702 m)  Weight: 145 lb (65.772 kg)   Body mass index is 22.71 kg/(m^2). GENERAL: The patient is a well-nourished female, in no acute distress. The vital signs are documented above. CARDIOVASCULAR: There is a regular rate and rhythm. I do not detect carotid bruits. She has palpable femoral pulses bilaterally. They're slightly diminished. I cannot palpate popliteal or pedal pulses. She does have a thrill and bruit in her left upper arm fistula. She has no significant lower extremity swelling. PULMONARY: There is good air exchange bilaterally without wheezing or rales. ABDOMEN: Soft and non-tender with normal pitched bowel sounds.  MUSCULOSKELETAL: There are no major deformities or cyanosis. NEUROLOGIC: No focal weakness or paresthesias are detected. SKIN: tips of her toes of the left foot have some dry tissue with minimal dry gangrene. She also has a small wound on the lateral aspect of both heels. There is also a wound on the plantar aspect of her right heel. PSYCHIATRIC: The patient has a normal affect.  DATA:  I have reviewed her records from Dr. Dunham is office. She has atrial fibrillation, hypothyroidism, iron deficiency anemia, hyperlipidemia, hypertension, type 2 diabetes which is been under reasonable control.  Have reviewed her arterial Doppler study that was done on 01/24/2014. Shows an ABI of 66% on the right although this may be falsely elevated because of calcific disease. Toe brachial index on the right was 38%. Toe brachial index on the left was 36%. ABI could not be obtained on the left as the vessels were noncompressible. The study suggested in adequate circulation for healing of the wounds on her right foot.  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 Based on her exam, she has  evidence of significant infrainguinal arterial occlusive disease bilaterally. Given her diabetes, peripheral vascular disease, and nonhealing wounds, she is at significant risk for limb loss. Recommended we proceed with arteriography to see what options she has for revascularization. He dialyzes on Tuesdays Thursdays and Saturdays we'll schedule this for Monday. We will have to stop her Coumadin 5 days prior to the procedure.  I have reviewed with the patient the indications for arteriography. In addition,   I have reviewed the potential complications of arteriography including but not limited to: Bleeding, arterial injury, arterial thrombosis, dye action, renal insufficiency, or other unpredictable medical problems. I have explained to the patient that if we find disease amenable to angioplasty we could potentially address this at the same time. I have discussed the potential complications of angioplasty and stenting, including but not limited to: Bleeding, arterial thrombosis, arterial injury, dissection, or the need for surgical intervention. We will make further recommendations pending these results. Of note if she is a candidate for a bypass she would need to undergo vein mapping preoperatively. Based on her exam it looks like she's had some vein taken from both lower extremities.   Ashly Yepez S Pablo Stauffer [facility] of  Beeper: [phone]    

## 2014-03-20 ENCOUNTER — Encounter: Payer: Medicare PPO | Admitting: Vascular Surgery

## 2014-03-20 ENCOUNTER — Encounter (HOSPITAL_COMMUNITY): Payer: Self-pay | Admitting: Pharmacy Technician

## 2014-03-20 ENCOUNTER — Telehealth: Payer: Self-pay | Admitting: Pharmacist Clinician (PhC)/ Clinical Pharmacy Specialist

## 2014-03-20 MED ORDER — ENOXAPARIN SODIUM 60 MG/0.6ML ~~LOC~~ SOLN: 60.0000 mg | SUBCUTANEOUS | Status: DC

## 2014-03-20 NOTE — Telephone Encounter (Signed)
Spoke with son, gave directions for enoxaparin bridging.  He read instructions back correctly.  Pt to take 60mg  q24hrs, Thurs, Fri, Sat, hold Sunday, procedure Monday - pt has to be at facility at 5:30am.  Restart enoxaparin 60mg  q 24h x 5 days Tuesday-Saturday.  Pt to take 6mg  warfarin Tue/Wed, 4.5mg  Thur/Fri/Sat, then resume previous dose on Sunday.  Will have dialysis draw INR on Tuesday.

## 2014-03-20 NOTE — Telephone Encounter (Signed)
Message copied by Rosalee KaufmanALVSTAD, Obed Samek L. on Thu Mar 20, 2014  2:24 PM ------      Message from: Runell GessBERRY, JONATHAN J      Created: Thu Mar 20, 2014 10:44 AM       The pt has AFIB and has had a CVA in the past. She'll need lovenox bridging but that will need to be cleared with Edilia Boickson.            JJB      ----- Message -----         From: Phillips HayKristin Janijah Symons, RPH-CPP         Sent: 03/19/2014   4:48 PM           To: Runell GessJonathan J Berry, MD            Dr. Allyson SabalBerry            Dr. Edilia Boickson has asked Ms. Plain to stop her warfarin for 5 days starting today (03-19-14) for a abdominal aortagram scheduled for Monday.  His note is in Epic if you need more information.       ------

## 2014-03-24 ENCOUNTER — Ambulatory Visit (HOSPITAL_COMMUNITY)
Admission: RE | Admit: 2014-03-24 | Discharge: 2014-03-24 | Disposition: A | Discharge status: Home / Self Care | Payer: Medicare PPO | Source: Ambulatory Visit | Attending: Vascular Surgery | Admitting: Vascular Surgery

## 2014-03-24 ENCOUNTER — Encounter (HOSPITAL_COMMUNITY)
Admission: RE | Disposition: A | Discharge status: Home / Self Care | Payer: Medicare PPO | Source: Ambulatory Visit | Attending: Vascular Surgery

## 2014-03-24 ENCOUNTER — Other Ambulatory Visit: Payer: Self-pay | Admitting: *Deleted

## 2014-03-24 DIAGNOSIS — I4891 Unspecified atrial fibrillation: Secondary | ICD-10-CM | POA: Insufficient documentation

## 2014-03-24 DIAGNOSIS — Z951 Presence of aortocoronary bypass graft: Secondary | ICD-10-CM | POA: Insufficient documentation

## 2014-03-24 DIAGNOSIS — I739 Peripheral vascular disease, unspecified: Secondary | ICD-10-CM

## 2014-03-24 DIAGNOSIS — N186 End stage renal disease: Secondary | ICD-10-CM | POA: Insufficient documentation

## 2014-03-24 DIAGNOSIS — D509 Iron deficiency anemia, unspecified: Secondary | ICD-10-CM | POA: Insufficient documentation

## 2014-03-24 DIAGNOSIS — E785 Hyperlipidemia, unspecified: Secondary | ICD-10-CM | POA: Insufficient documentation

## 2014-03-24 DIAGNOSIS — Z01810 Encounter for preprocedural cardiovascular examination: Secondary | ICD-10-CM

## 2014-03-24 DIAGNOSIS — L98499 Non-pressure chronic ulcer of skin of other sites with unspecified severity: Principal | ICD-10-CM | POA: Insufficient documentation

## 2014-03-24 DIAGNOSIS — I4892 Unspecified atrial flutter: Secondary | ICD-10-CM | POA: Insufficient documentation

## 2014-03-24 DIAGNOSIS — Z992 Dependence on renal dialysis: Secondary | ICD-10-CM | POA: Insufficient documentation

## 2014-03-24 DIAGNOSIS — L97509 Non-pressure chronic ulcer of other part of unspecified foot with unspecified severity: Secondary | ICD-10-CM | POA: Insufficient documentation

## 2014-03-24 DIAGNOSIS — I2510 Atherosclerotic heart disease of native coronary artery without angina pectoris: Secondary | ICD-10-CM | POA: Insufficient documentation

## 2014-03-24 DIAGNOSIS — I120 Hypertensive chronic kidney disease with stage 5 chronic kidney disease or end stage renal disease: Secondary | ICD-10-CM | POA: Insufficient documentation

## 2014-03-24 DIAGNOSIS — Z87891 Personal history of nicotine dependence: Secondary | ICD-10-CM | POA: Insufficient documentation

## 2014-03-24 DIAGNOSIS — Z8673 Personal history of transient ischemic attack (TIA), and cerebral infarction without residual deficits: Secondary | ICD-10-CM | POA: Insufficient documentation

## 2014-03-24 DIAGNOSIS — E039 Hypothyroidism, unspecified: Secondary | ICD-10-CM | POA: Insufficient documentation

## 2014-03-24 DIAGNOSIS — E119 Type 2 diabetes mellitus without complications: Secondary | ICD-10-CM | POA: Insufficient documentation

## 2014-03-24 DIAGNOSIS — Z794 Long term (current) use of insulin: Secondary | ICD-10-CM | POA: Insufficient documentation

## 2014-03-24 DIAGNOSIS — Z7901 Long term (current) use of anticoagulants: Secondary | ICD-10-CM | POA: Insufficient documentation

## 2014-03-24 HISTORY — PX: ABDOMINAL AORTAGRAM: SHX5454

## 2014-03-24 LAB — PROTIME-INR
INR: 1.1 (ref 0.00–1.49)
PROTHROMBIN TIME: 14 s (ref 11.6–15.2)

## 2014-03-24 LAB — POCT I-STAT, CHEM 8
BUN: 51 mg/dL — AB (ref 6–23)
CALCIUM ION: 0.8 mmol/L — AB (ref 1.13–1.30)
CHLORIDE: 105 meq/L (ref 96–112)
Creatinine, Ser: 5.1 mg/dL — ABNORMAL HIGH (ref 0.50–1.10)
GLUCOSE: 86 mg/dL (ref 70–99)
HEMATOCRIT: 38 % (ref 36.0–46.0)
Hemoglobin: 12.9 g/dL (ref 12.0–15.0)
POTASSIUM: 4.9 meq/L (ref 3.7–5.3)
Sodium: 134 mEq/L — ABNORMAL LOW (ref 137–147)
TCO2: 20 mmol/L (ref 0–100)

## 2014-03-24 LAB — GLUCOSE, CAPILLARY
GLUCOSE-CAPILLARY: 74 mg/dL (ref 70–99)
Glucose-Capillary: 75 mg/dL (ref 70–99)

## 2014-03-24 SURGERY — ABDOMINAL AORTAGRAM
Anesthesia: LOCAL

## 2014-03-24 MED ORDER — HEPARIN (PORCINE) IN NACL 2-0.9 UNIT/ML-% IJ SOLN
INTRAMUSCULAR | Status: AC
  Filled 2014-03-24: qty 500

## 2014-03-24 MED ORDER — LIDOCAINE HCL (PF) 1 % IJ SOLN
INTRAMUSCULAR | Status: AC
  Filled 2014-03-24: qty 30

## 2014-03-24 MED ORDER — FENTANYL CITRATE 0.05 MG/ML IJ SOLN
INTRAMUSCULAR | Status: AC
  Filled 2014-03-24: qty 2

## 2014-03-24 MED ORDER — MIDAZOLAM HCL 2 MG/2ML IJ SOLN
INTRAMUSCULAR | Status: AC
  Filled 2014-03-24: qty 2

## 2014-03-24 MED ORDER — OXYCODONE-ACETAMINOPHEN 5-325 MG PO TABS: 1.0000 | ORAL_TABLET | ORAL | Status: DC | PRN

## 2014-03-24 NOTE — H&P (View-Only) (Signed)
Vascular and Vein Specialist of [facility]  Patient name: Megan Vasquez MRN: [id number] DOB: September 24, 1941 Sex: female  REASON FOR CONSULT: Evaluate peripheral vascular disease. Referred by Dr. Camille Bal.  HPI: Megan Vasquez is a 73 y.o. female who had a toenail trimmed from her right great toe approximately a month ago. The patient states that she developed a wound which has been slow to heal.  Of note prior to this she was ambulatory with a walker. I do not get any history of claudication although her activity I suspect is very limited. She denies any history of rest pain. She denies any history of previous nonhealing ulcers.  She is on Coumadin for atrial flutter ablation. She also has end-stage renal disease and dialyzes with a left upper arm fistula on Tuesdays Thursdays and Saturdays. She has undergone previous CABG in 2007 but it is not clear which leg the vein was taken from.   Past Medical History  Diagnosis Date  . Diabetes mellitus without complication   . Hypertension   . Hyperlipidemia   . Atrial fibrillation   . ESRD (end stage renal disease)     HD since 06/2000  . CAD (coronary artery disease)   . 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 History of nuclear stress test 10/2012    lexiscan; normal stress test; post-stress EF72%  . Peripheral arterial disease    Family History  Problem Relation Age of Onset  . Heart disease Mother   . Heart attack Brother   . Breast cancer Sister   . Diabetes Sister   . Heart attack Sister   . Diabetes Child    SOCIAL HISTORY: History  Substance Use Topics  . Smoking status: Former Smoker    Quit date: 06/10/2009  . Smokeless tobacco: Never Used  . Alcohol Use: No   No Known Allergies Current Outpatient Prescriptions  Medication Sig Dispense Refill  . Bisacodyl (LAXATIVE PO) Take by  mouth.      . calcium acetate (PHOSLO) 667 MG capsule       . Calcium Carbonate (CALCIUM 600 PO) Take by mouth.      . cephALEXin (KEFLEX) 500 MG capsule Take 1 capsule (500 mg total) by mouth 2 (two) times daily.  20 capsule  1  . cloNIDine (CATAPRES) 0.2 MG tablet TAKE 1 TABLET BY MOUTH TWICE A DAY  60 tablet  6  . diltiazem (CARDIZEM CD) 240 MG 24 hr capsule TAKE 1 CAPSULE EVERY DAY  30 capsule  11  . insulin glargine (LANTUS) 100 UNIT/ML injection Inject into the skin at bedtime. 10 units twice a day      . levothyroxine (SYNTHROID, LEVOTHROID) 200 MCG tablet Take 200 mcg by mouth daily before breakfast.      . levothyroxine (SYNTHROID, LEVOTHROID) 25 MCG tablet Take 25 mcg by mouth daily before breakfast.      . metoprolol succinate (TOPROL-XL) 50 MG 24 hr tablet TAKE 1 AND 1/2 TABLET DAILY  45 tablet  11  . Multiple Vitamin (MULTIVITAMIN) capsule Take 1 capsule by mouth daily.      . pravastatin (PRAVACHOL) 40 MG tablet Take 40 mg by mouth daily.      Marland Kitchen warfarin (COUMADIN) 3 MG tablet Take 1 to 1 & 1/2 tablets by mouth daily as directed  40 tablet  1  No current facility-administered medications for this visit.   REVIEW OF SYSTEMS: Arly.Keller[X ] denotes positive finding; [  ] denotes negative finding  CARDIOVASCULAR:  [ ]  chest pain   [ ]  chest pressure   [ ]  palpitations   [ ]  orthopnea   [ ]  dyspnea on exertion   [ ]  claudication   [ ]  rest pain   [ ]  DVT   [ ]  phlebitis PULMONARY:   [ ]  productive cough   [ ]  asthma   [ ]  wheezing NEUROLOGIC:   [ ]  weakness  [ ]  paresthesias  [ ]  aphasia  [ ]  amaurosis  [ ]  dizziness HEMATOLOGIC:   [ ]  bleeding problems   [ ]  clotting disorders MUSCULOSKELETAL:  [ ]  joint pain   [ ]  joint swelling Arly.Keller[X ] leg swelling GASTROINTESTINAL: [ ]   blood in stool  [ ]   hematemesis GENITOURINARY:  [ ]   dysuria  [ ]   hematuria PSYCHIATRIC:  [ ]  history of major depression INTEGUMENTARY:  [ ]  rashes  [ ]  ulcers CONSTITUTIONAL:  [ ]  fever   [ ]  chills  PHYSICAL  EXAM: Filed Vitals:   03/19/14 1301  BP: 118/72  Pulse: 82  Resp: 14  Height: 5\' 7"  (1.702 m)  Weight: 145 lb (65.772 kg)   Body mass index is 22.71 kg/(m^2). GENERAL: The patient is a well-nourished female, in no acute distress. The vital signs are documented above. CARDIOVASCULAR: There is a regular rate and rhythm. I do not detect carotid bruits. She has palpable femoral pulses bilaterally. They're slightly diminished. I cannot palpate popliteal or pedal pulses. She does have a thrill and bruit in her left upper arm fistula. She has no significant lower extremity swelling. PULMONARY: There is good air exchange bilaterally without wheezing or rales. ABDOMEN: Soft and non-tender with normal pitched bowel sounds.  MUSCULOSKELETAL: There are no major deformities or cyanosis. NEUROLOGIC: No focal weakness or paresthesias are detected. SKIN: tips of her toes of the left foot have some dry tissue with minimal dry gangrene. She also has a small wound on the lateral aspect of both heels. There is also a wound on the plantar aspect of her right heel. PSYCHIATRIC: The patient has a normal affect.  DATA:  I have reviewed her records from Dr. Eliott Nineunham is office. She has atrial fibrillation, hypothyroidism, iron deficiency anemia, hyperlipidemia, hypertension, type 2 diabetes which is been under reasonable control.  Have reviewed her arterial Doppler study that was done on 01/24/2014. Shows an ABI of 66% on the right although this may be falsely elevated because of calcific disease. Toe brachial index on the right was 38%. Toe brachial index on the left was 36%. ABI could not be obtained on the left as the vessels were noncompressible. The study suggested in adequate circulation for healing of the wounds on her right foot.  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 Based on her exam, she has  evidence of significant infrainguinal arterial occlusive disease bilaterally. Given her diabetes, peripheral vascular disease, and nonhealing wounds, she is at significant risk for limb loss. Recommended we proceed with arteriography to see what options she has for revascularization. He dialyzes on Tuesdays Thursdays and Saturdays we'll schedule this for Monday. We will have to stop her Coumadin 5 days prior to the procedure.  I have reviewed with the patient the indications for arteriography. In addition,  I have reviewed the potential complications of arteriography including but not limited to: Bleeding, arterial injury, arterial thrombosis, dye action, renal insufficiency, or other unpredictable medical problems. I have explained to the patient that if we find disease amenable to angioplasty we could potentially address this at the same time. I have discussed the potential complications of angioplasty and stenting, including but not limited to: Bleeding, arterial thrombosis, arterial injury, dissection, or the need for surgical intervention. We will make further recommendations pending these results. Of note if she is a candidate for a bypass she would need to undergo vein mapping preoperatively. Based on her exam it looks like she's had some vein taken from both lower extremities.   Chuck Hinthristopher S Dickson [facility] [place] Beeper: [phone]

## 2014-03-24 NOTE — Op Note (Signed)
   PATIENT: Megan Vasquez  MRN: [id number] DOB: 03/10/1941    DATE OF PROCEDURE: 03/24/2014  INDICATIONS: Megan MaudlinMarjorie Shippee is a 73 y.o. female who presented with nonhealing wounds of both feet. She had evidence of severe infrainguinal arterial occlusive disease. She presents for arteriography.  PROCEDURE:  1. Ultrasound-guided access to the right common femoral artery. 2. Aortogram with bilateral iliac arteriogram and bilateral lower extremity runoff  SURGEON: Di Kindlehristopher S. Edilia Boickson, MD, FACS  ANESTHESIA: local with sedation   EBL: minimal  TECHNIQUE: The patient was taken to the peripheral vascular lab and received one half a milligram of Versed and 25 mcg of fentanyl. Both groins were prepped and draped in usual sterile fashion. After the skin was anesthetized with 1% lidocaine, and under ultrasound guidance, the right common femoral artery was cannulated and a guidewire introduced into the infrarenal aorta under fluoroscopic control. Of note there was significant calcium within the artery and I had to stay slightly higher than normal in order to get above the calcium. A 5 French sheath was introduced over the wire. The pigtail catheter was positioned at the L1 vertebral body and flush aortogram obtained. The catheter was positioned above the aortic bifurcation and bilateral lower extremity runoff films were obtained an oblique iliac projection was obtained. An additional spot films obtained of the right leg. At the completion of the procedure, the catheter was removed over a wire. The sheath was removed and pressure held for hemostasis. No immediate complications were noted.  FINDINGS:  1. There are single renal arteries bilaterally with no significant renal artery stenosis identified. 2. The infrarenal aorta is widely patent. Bilateral common iliac arteries, and external iliac arteries are widely patent. The hypogastric arteries are patent with moderate disease. 3. On the right side, the  common femoral and deep femoral artery are patent. There is moderate diffuse disease throughout the superficial femoral artery and severe disease of the below-knee popliteal artery. The anterior tibial, tibial peroneal trunk, peroneal, and posterior tibial arteries are occluded. There is reconstitution of the peroneal artery. The anterior tibial and posterior tibial artery are occluded although there is some reconstitution of a diseased posterior tibial artery in the foot. 4. On the left side, the common femoral and deep femoral artery are patent. There is moderate diffuse disease throughout the superficial femoral artery and severe disease of the below-knee pop artery. The anterior tibial, tibial peroneal trunk, peroneal and posterior tibial arteries are occluded. There is reconstitution of the peroneal artery distally on the left.  Waverly Ferrarihristopher Dickson, MD, FACS [facility]  DATE OF DICTATION:   03/24/2014

## 2014-03-24 NOTE — Interval H&P Note (Signed)
History and Physical Interval Note:  03/24/2014 6:37 AM  Megan Vasquez  has presented today for surgery, with the diagnosis of PVD  The various methods of treatment have been discussed with the patient and family. After consideration of risks, benefits and other options for treatment, the patient has consented to  Procedure(s): ABDOMINAL AORTAGRAM (N/A) as a surgical intervention .  The patient's history has been reviewed, patient examined, no change in status, stable for surgery.  I have reviewed the patient's chart and labs.  Questions were answered to the patient's satisfaction.     Chuck Hinthristopher S Janiel Crisostomo

## 2014-03-24 NOTE — Discharge Instructions (Signed)

## 2014-03-25 ENCOUNTER — Telehealth: Payer: Self-pay | Admitting: Vascular Surgery

## 2014-03-25 NOTE — Telephone Encounter (Addendum)
Message copied by Rosalyn ChartersOUX, BONNIE A on Tue Mar 25, 2014  9:34 AM ------      Message from: Lorin MercyMCCHESNEY, MARILYN K      Created: Mon Mar 24, 2014  9:33 AM      Regarding: Schedule                   ----- Message -----         From: Chuck Hinthristopher S Dickson, MD         Sent: 03/24/2014   8:35 AM           To: V[facility] Charge Pool      Subject: charge                                                   PROCEDURE:       1. Ultrasound-guided access to the right common femoral artery.      2. Aortogram with bilateral iliac arteriogram and bilateral lower extremity runoff            SURGEON: Megan Kindlehristopher S. Edilia Boickson, MD, FACS            She needs a follow visit in 2-3 weeks with a vein map of both lower extremities. I believe she has had some vein taken from both legs for previous CABG. Thank you. CD ------  notified patient of post op appt. on 04-16-14 at 3:00 with dr. Edilia Bodickson

## 2014-03-26 ENCOUNTER — Encounter: Payer: Self-pay | Admitting: Vascular Surgery

## 2014-03-28 ENCOUNTER — Telehealth: Payer: Self-pay | Admitting: Cardiovascular Disease

## 2014-03-28 NOTE — Telephone Encounter (Signed)
Patient had a procedure done earlier this week and her discharge paperwork states resume current medication.  Patient needs instructions for her Coumadin.

## 2014-03-28 NOTE — Telephone Encounter (Signed)
Pt. Needs instructions for her coumadin

## 2014-03-31 ENCOUNTER — Encounter: Payer: Medicare PPO | Admitting: Surgery

## 2014-04-01 NOTE — Telephone Encounter (Signed)
Spoke with Aggie Cosierheresa, states pt only took lovenox shots x 3 days post procedure.  Advised not to worry, too late to do anything about it now.  Pt due for INR check today at dialysis, will have results tomorrow.  Aggie Cosierheresa asked that we call her in the future with all results.

## 2014-04-09 ENCOUNTER — Other Ambulatory Visit: Payer: Self-pay | Admitting: Pharmacist Clinician (PhC)/ Clinical Pharmacy Specialist

## 2014-04-09 MED ORDER — WARFARIN SODIUM 3 MG PO TABS: ORAL_TABLET | ORAL | Status: DC

## 2014-04-10 LAB — PROTIME-INR: INR: 2.6 — AB (ref 0.9–1.1)

## 2014-04-11 ENCOUNTER — Telehealth: Payer: Self-pay | Admitting: Cardiovascular Disease

## 2014-04-11 ENCOUNTER — Ambulatory Visit (INDEPENDENT_AMBULATORY_CARE_PROVIDER_SITE_OTHER): Payer: Medicare PPO | Admitting: Pharmacist Clinician (PhC)/ Clinical Pharmacy Specialist

## 2014-04-11 DIAGNOSIS — Z7901 Long term (current) use of anticoagulants: Secondary | ICD-10-CM

## 2014-04-11 DIAGNOSIS — I4891 Unspecified atrial fibrillation: Secondary | ICD-10-CM

## 2014-04-11 NOTE — Telephone Encounter (Signed)
Returned a call to patient's daughter. Informed her that the renal doctors routinely will send a note informing the primary doctor of any changed they have made in a patient therapy. Once Dr. Allyson Sabal receives this information if he doesn't agree he will speak with the appropriate person(s). Daughter voiced understanding of this and said she just wanted to be sure that all doctors are on the same "page."

## 2014-04-11 NOTE — Telephone Encounter (Signed)
Pt is on dialysis and they want wanted her to stop some of the medicine. She could not remember the name of the medicine,she is going to ask them to fax this information to you today.

## 2014-04-15 ENCOUNTER — Encounter: Payer: Self-pay | Admitting: Vascular Surgery

## 2014-04-16 ENCOUNTER — Ambulatory Visit (HOSPITAL_COMMUNITY)
Admission: RE | Admit: 2014-04-16 | Discharge: 2014-04-16 | Disposition: A | Discharge status: Home / Self Care | Payer: Medicare PPO | Source: Ambulatory Visit | Attending: Vascular Surgery | Admitting: Vascular Surgery

## 2014-04-16 ENCOUNTER — Ambulatory Visit (INDEPENDENT_AMBULATORY_CARE_PROVIDER_SITE_OTHER): Payer: Medicare PPO | Admitting: Vascular Surgery

## 2014-04-16 ENCOUNTER — Encounter: Payer: Self-pay | Admitting: Vascular Surgery

## 2014-04-16 VITALS — BP 127/77 | HR 101 | Ht 67.0 in | Wt 145.0 lb

## 2014-04-16 DIAGNOSIS — L98499 Non-pressure chronic ulcer of skin of other sites with unspecified severity: Secondary | ICD-10-CM

## 2014-04-16 DIAGNOSIS — Z01810 Encounter for preprocedural cardiovascular examination: Secondary | ICD-10-CM

## 2014-04-16 DIAGNOSIS — I70209 Unspecified atherosclerosis of native arteries of extremities, unspecified extremity: Secondary | ICD-10-CM

## 2014-04-16 DIAGNOSIS — I739 Peripheral vascular disease, unspecified: Secondary | ICD-10-CM

## 2014-04-16 NOTE — Progress Notes (Signed)
[facility]  Patient name: Megan MaudlinMarjorie Vasquez MRN: [id number] DOB: 09/23/1941 Sex: female  REASON FOR VISIT:Follow up of peripheral vascular disease and right great toe wound.   HPI: Megan MaudlinMarjorie Vasquez is a 73 y.o. female who I saw in consultation on 03/19/2014 with a right great toe wound which she's had for a month and evidence of significant peripheral vascular disease. Her ABI on the right was 66% although this was falsely elevated likely. Her digital brachial index on the right of 38%.  Of note she is on Coumadin for atrial flutter. She also has end-stage renal disease and is on dialysis Tuesdays Thursdays and Saturdays. She's had a previous CABG in 2007. She was not sure which side her vein had been taken from.  She comes in for a follow up visit. According to the family, the wounds in her right foot have improved significantly. There is no erythema or drainage. She has not had any fever.   Past Medical History  Diagnosis Date  . Diabetes mellitus without complication   . Hypertension   . Hyperlipidemia   . Atrial fibrillation   . ESRD (end stage renal disease)     HD since 06/2000  . CAD (coronary artery disease)   . 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 History of nuclear stress test 10/2012    lexiscan; normal stress test; post-stress EF72%  . Peripheral arterial disease    Family History  Problem Relation Age of Onset  . Heart disease Mother   . Heart attack Brother   . Breast cancer Sister   . Diabetes Sister   . Heart attack Sister   . Diabetes Child    SOCIAL HISTORY: History  Substance Use Topics  . Smoking status: Former Smoker    Quit date: 06/10/2009  . Smokeless tobacco: Never Used  . Alcohol Use: No   No Known Allergies Current Outpatient Prescriptions  Medication Sig Dispense Refill  .  calcium acetate (PHOSLO) 667 MG capsule Take 667 mg by mouth daily.       . Calcium Carbonate (CALCIUM 600 PO) Take 1 tablet by mouth daily.       Marland Kitchen. diltiazem (CARDIZEM CD) 240 MG 24 hr capsule TAKE 1 CAPSULE EVERY DAY  30 capsule  11  . enoxaparin (LOVENOX) 60 MG/0.6ML injection Inject 0.6 mLs (60 mg total) into the skin daily.  8 Syringe  0  . insulin glargine (LANTUS) 100 UNIT/ML injection Inject into the skin at bedtime. 10 units twice a day      . levothyroxine (SYNTHROID, LEVOTHROID) 200 MCG tablet Take 200 mcg by mouth daily before breakfast.      . levothyroxine (SYNTHROID, LEVOTHROID) 25 MCG tablet Take 50 mcg by mouth daily before breakfast.       . metoprolol succinate (TOPROL-XL) 50 MG 24 hr tablet Take 75 mg by mouth daily. Take with or immediately following a meal.      . pravastatin (PRAVACHOL) 40 MG tablet Take 40 mg by mouth daily.      Marland Kitchen. warfarin (COUMADIN) 3 MG tablet Take 1 to 1.5 tablets by mouth daily as directed by coumadin clinic  40 tablet  3   No current facility-administered medications for this visit.   REVIEW OF SYSTEMS: Arly.Keller[X ] denotes positive finding; [  ] denotes negative finding  CARDIOVASCULAR:  [ ]  chest pain   [ ]  chest pressure   [ ]  palpitations   [ ]  orthopnea   [ ]  dyspnea on exertion   [ ]  claudication   [ ]  rest pain   [ ]  DVT   [ ]  phlebitis PULMONARY:   [ ]  productive cough   [ ]  asthma   [ ]  wheezing NEUROLOGIC:   [ ]  weakness  [ ]  paresthesias  [ ]  aphasia  [ ]  amaurosis  [ ]  dizziness HEMATOLOGIC:   [ ]  bleeding problems   [ ]  clotting disorders MUSCULOSKELETAL:  [ ]  joint pain   [ ]  joint swelling [ ]  leg swelling GASTROINTESTINAL: [ ]   blood in stool  [ ]   hematemesis GENITOURINARY:  [ ]   dysuria  [ ]   hematuria PSYCHIATRIC:  [ ]  history of major depression INTEGUMENTARY:  [ ]  rashes  [ ]  ulcers CONSTITUTIONAL:  [ ]  fever   [ ]  chills  PHYSICAL EXAM: Filed Vitals:   04/16/14 1557  BP: 127/77  Pulse: 101  Height: 5\' 7"  (1.702 m)  Weight:  145 lb (65.772 kg)  SpO2: 99%   Body mass index is 22.71 kg/(m^2). GENERAL: The patient is a well-nourished female, in no acute distress. The vital signs are documented above. CARDIOVASCULAR: There is a regular rate and rhythm.  PULMONARY: There is good air exchange bilaterally without wheezing or rales. SKIN: the wounds on the tips of her toes were all dry. The right heel wound is dry. There is no erythema or drainage. PSYCHIATRIC: The patient has a normal affect.  DATA:  I have independently interpreted her vein mapping. It looks like her vein for CABG was taken from the left leg. There is no available vein on the left. The greater saphenous vein in the right leg is marginal. Diameters range from 0.26-0.36 centimeters.  MEDICAL ISSUES:  Atherosclerotic PVD with ulceration I have reviewed her arteriogram. Her only option for bypass on both sides is a small diseased peroneal artery. Given her history of chronic renal insufficiency and diabetes, I suspect her arteries are markedly calcified. In addition, her only available vein is the right greater saphenous vein which is marginal in size. Given that the wounds on the right foot are improving I would not recommend attempted revascularization as I think this would be associated with very limited chance for success and also would be associated with significant risk of the graft occluded given that we would have to interrupt her collaterals in order to expose the peroneal artery. I've explained this to her and her family who understand and are in agreement. I would only consider bypass of the wounds progressed and it was clear that she would require an amputation. I plan on seeing her back in 3 months. She knows to call sooner if she has problems.    Return in about 3 months (around 07/17/2014).  Chuck Hint [facility] [place] [facility]: [phone]

## 2014-04-16 NOTE — Assessment & Plan Note (Signed)
I have reviewed her arteriogram. Her only option for bypass on both sides is a small diseased peroneal artery. Given her history of chronic renal insufficiency and diabetes, I suspect her arteries are markedly calcified. In addition, her only available vein is the right greater saphenous vein which is marginal in size. Given that the wounds on the right foot are improving I would not recommend attempted revascularization as I think this would be associated with very limited chance for success and also would be associated with significant risk of the graft occluded given that we would have to interrupt her collaterals in order to expose the peroneal artery. I've explained this to her and her family who understand and are in agreement. I would only consider bypass of the wounds progressed and it was clear that she would require an amputation. I plan on seeing her back in 3 months. She knows to call sooner if she has problems.

## 2014-05-08 LAB — PROTIME-INR

## 2014-05-09 ENCOUNTER — Encounter: Payer: Self-pay | Admitting: Cardiovascular Disease

## 2014-06-05 LAB — PROTIME-INR: INR: 1.8 — AB (ref 0.9–1.1)

## 2014-06-06 ENCOUNTER — Ambulatory Visit (INDEPENDENT_AMBULATORY_CARE_PROVIDER_SITE_OTHER): Payer: Medicare PPO | Admitting: Pharmacist Clinician (PhC)/ Clinical Pharmacy Specialist

## 2014-06-06 ENCOUNTER — Other Ambulatory Visit: Payer: Self-pay | Admitting: Cardiovascular Disease

## 2014-06-06 DIAGNOSIS — Z7901 Long term (current) use of anticoagulants: Secondary | ICD-10-CM

## 2014-06-06 NOTE — Telephone Encounter (Signed)
Rx refill sent to patient pharmacy   

## 2014-06-20 LAB — PROTIME-INR

## 2014-07-09 ENCOUNTER — Telehealth: Payer: Self-pay | Admitting: Pharmacist Clinician (PhC)/ Clinical Pharmacy Specialist

## 2014-07-09 MED ORDER — WARFARIN SODIUM 3 MG PO TABS: ORAL_TABLET | ORAL | Status: DC

## 2014-07-09 NOTE — Telephone Encounter (Signed)
Patient is almost out of coumadin---pharmacy has sent request a couple of times and has not heard back.

## 2014-07-15 ENCOUNTER — Encounter: Payer: Self-pay | Admitting: Vascular Surgery

## 2014-07-16 ENCOUNTER — Ambulatory Visit (INDEPENDENT_AMBULATORY_CARE_PROVIDER_SITE_OTHER): Payer: Medicare PPO | Admitting: Vascular Surgery

## 2014-07-16 ENCOUNTER — Encounter: Payer: Self-pay | Admitting: Vascular Surgery

## 2014-07-16 VITALS — BP 132/81 | HR 100 | Ht 67.0 in | Wt 145.0 lb

## 2014-07-16 DIAGNOSIS — L98499 Non-pressure chronic ulcer of skin of other sites with unspecified severity: Secondary | ICD-10-CM

## 2014-07-16 DIAGNOSIS — I70209 Unspecified atherosclerosis of native arteries of extremities, unspecified extremity: Secondary | ICD-10-CM

## 2014-07-16 DIAGNOSIS — I739 Peripheral vascular disease, unspecified: Secondary | ICD-10-CM

## 2014-07-16 NOTE — Addendum Note (Signed)
Addended by: Sharee Pimple on: 07/16/2014 03:42 PM   Modules accepted: Orders

## 2014-07-16 NOTE — Assessment & Plan Note (Signed)
The ulcer on her right foot has now healed. She is essentially asymptomatic. She is not a smoker. I've encouraged her to stay as active as possible. I have ordered follow up ABIs for one year and I will see her back at that time. She knows to call sooner if she has problems.

## 2014-07-16 NOTE — Progress Notes (Signed)
Vascular and Vein Specialist of [facility]  Patient name: Megan Vasquez MRN: [id number] DOB: 01-18-1941 Sex: female  REASON FOR VISIT: Follow up of peripheral vascular disease  HPI: Megan Vasquez is a 73 y.o. female who I last saw on 04/16/2014. She had originally presented with a wound on her right great toe and had evidence of her full vascular disease. ABI on the right with 66% although this was likely falsely elevated. The patient has a history of atrial fibrillation and is on Coumadin. She also has end-stage renal disease and is on dialysis. In addition she has diabetes.  At the time of her last visit I reviewed the results of her arteriogram with her. Her only option for a bypass was bypass into a small diseased peroneal artery. In addition the greater saphenous vein on the right side was marginal in size with diameters ranging from 0.26-0.36 cm. The wounds on the right foot were improving and I did not think chances of success with revascularization were good. She comes in for a 3 month follow up visit.  Since saw her last, she states that the wound on her right foot have healed. She has no significant claudication in either lower extremity. However, her activity is fairly limited. She denies any history of rest pain or new ulcers.  Past Medical History  Diagnosis Date  . Diabetes mellitus without complication   . Hypertension   . Hyperlipidemia   . Atrial fibrillation   . ESRD (end stage renal disease)     HD since 06/2000  . CAD (coronary artery disease)   . 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 History of nuclear stress test 10/2012    lexiscan; normal stress test; post-stress EF72%  . Peripheral arterial disease   . Osteoporosis    Family History  Problem Relation Age of Onset  . Heart disease Mother   . Heart attack Brother   .  Breast cancer Sister   . Diabetes Sister   . Heart attack Sister   . Diabetes Child    SOCIAL HISTORY: History  Substance Use Topics  . Smoking status: Former Smoker    Quit date: 06/10/2009  . Smokeless tobacco: Never Used  . Alcohol Use: No   No Known Allergies Current Outpatient Prescriptions  Medication Sig Dispense Refill  . calcium acetate (PHOSLO) 667 MG capsule Take 667 mg by mouth daily.       . Calcium Carbonate (CALCIUM 600 PO) Take 1 tablet by mouth daily.       Marland Kitchen diltiazem (CARDIZEM CD) 240 MG 24 hr capsule TAKE ONE CAPSULE BY MOUTH DAILY  30 capsule  11  . enoxaparin (LOVENOX) 60 MG/0.6ML injection Inject 0.6 mLs (60 mg total) into the skin daily.  8 Syringe  0  . insulin glargine (LANTUS) 100 UNIT/ML injection Inject into the skin at bedtime. 10 units twice a day      . levothyroxine (SYNTHROID, LEVOTHROID) 200 MCG tablet Take 200 mcg by mouth daily before breakfast.      . levothyroxine (SYNTHROID, LEVOTHROID) 25 MCG tablet Take 50 mcg by mouth daily before breakfast.       . metoprolol succinate (TOPROL-XL) 50 MG 24 hr tablet Take 75 mg by mouth daily. Take with or immediately following a meal.      . pravastatin (PRAVACHOL) 40 MG  tablet Take 40 mg by mouth daily.      Marland Kitchen warfarin (COUMADIN) 3 MG tablet Take 1 to 1.5 tablets by mouth daily as directed by coumadin clinic  40 tablet  3   No current facility-administered medications for this visit.   REVIEW OF SYSTEMS: Arly.Keller ] denotes positive finding; [  ] denotes negative finding  CARDIOVASCULAR:   chest pain    chest pressure    palpitations    orthopnea    dyspnea on exertion    claudication    rest pain    DVT    phlebitis PULMONARY:    productive cough    asthma    wheezing NEUROLOGIC:    weakness   paresthesias   aphasia   amaurosis   dizziness HEMATOLOGIC:    bleeding problems    clotting disorders MUSCULOSKELETAL:   joint pain    joint swelling   leg swelling GASTROINTESTINAL:   blood in stool    hematemesis GENITOURINARY:    dysuria    hematuria PSYCHIATRIC:   history of major depression INTEGUMENTARY:   rashes   ulcers CONSTITUTIONAL:   fever    chills  PHYSICAL EXAM: Filed Vitals:   07/16/14 1002  BP: 132/81  Pulse: 100  Height:  (1.702 m)  Weight: 145 lb (65.772 kg)  SpO2: 100%   Body mass index is 22.71 kg/(m^2). GENERAL: The patient is a well-nourished female, in no acute distress. The vital signs are documented above. CARDIOVASCULAR: There is a regular rate and rhythm. I do not detect carotid bruits. She has palpable femoral pulses and a palpable right popliteal pulse. I cannot palpate a left popliteal pulse. I cannot palpate pedal pulses. She has a functioning left upper arm AV fistula which is somewhat pulsatile. PULMONARY: There is good air exchange bilaterally without wheezing or rales. ABDOMEN: Soft and non-tender with normal pitched bowel sounds.  MUSCULOSKELETAL: There are no major deformities or cyanosis. NEUROLOGIC: No focal weakness or paresthesias are detected. SKIN: There are no ulcers or rashes noted. PSYCHIATRIC: The patient has a normal affect.  DATA:  I reviewed her arteriogram which was done in May of this year. On the right side, which was the side with the ulcer, the common femoral and deep femoral arteries were patent. There was moderate disease throughout the superficial femoral artery and severe disease of the below-knee pop 2 artery. The tibials were occluded with reconstitution of the peroneal artery.  MEDICAL ISSUES:  Peripheral arterial disease The ulcer on her right foot has now healed. She is essentially asymptomatic. She is not a smoker. I've encouraged her to stay as active as possible. I have ordered follow up ABIs for one year and I will see her back at that time. She knows to call sooner if she has problems.     Return in about 1 year (around  07/17/2015).  DICKSON,CHRISTOPHER S [facility] [place] [facility]: [phone]

## 2014-07-17 LAB — PROTIME-INR

## 2014-07-22 ENCOUNTER — Emergency Department (HOSPITAL_COMMUNITY): Payer: Medicare PPO

## 2014-07-22 ENCOUNTER — Emergency Department (HOSPITAL_COMMUNITY)
Admission: EM | Admit: 2014-07-22 | Discharge: 2014-07-22 | Disposition: A | Discharge status: Home / Self Care | Payer: Medicare PPO | Attending: Emergency Medicine | Admitting: Emergency Medicine

## 2014-07-22 ENCOUNTER — Encounter (HOSPITAL_COMMUNITY): Payer: Self-pay | Admitting: Emergency Medicine

## 2014-07-22 DIAGNOSIS — I2510 Atherosclerotic heart disease of native coronary artery without angina pectoris: Secondary | ICD-10-CM | POA: Diagnosis not present

## 2014-07-22 DIAGNOSIS — I120 Hypertensive chronic kidney disease with stage 5 chronic kidney disease or end stage renal disease: Secondary | ICD-10-CM | POA: Diagnosis not present

## 2014-07-22 DIAGNOSIS — I498 Other specified cardiac arrhythmias: Secondary | ICD-10-CM | POA: Diagnosis present

## 2014-07-22 DIAGNOSIS — M810 Age-related osteoporosis without current pathological fracture: Secondary | ICD-10-CM | POA: Insufficient documentation

## 2014-07-22 DIAGNOSIS — N186 End stage renal disease: Secondary | ICD-10-CM | POA: Diagnosis not present

## 2014-07-22 DIAGNOSIS — Z951 Presence of aortocoronary bypass graft: Secondary | ICD-10-CM | POA: Diagnosis not present

## 2014-07-22 DIAGNOSIS — R011 Cardiac murmur, unspecified: Secondary | ICD-10-CM | POA: Insufficient documentation

## 2014-07-22 DIAGNOSIS — Z79899 Other long term (current) drug therapy: Secondary | ICD-10-CM | POA: Insufficient documentation

## 2014-07-22 DIAGNOSIS — E785 Hyperlipidemia, unspecified: Secondary | ICD-10-CM | POA: Diagnosis not present

## 2014-07-22 DIAGNOSIS — Z794 Long term (current) use of insulin: Secondary | ICD-10-CM | POA: Insufficient documentation

## 2014-07-22 DIAGNOSIS — Z9889 Other specified postprocedural states: Secondary | ICD-10-CM | POA: Diagnosis not present

## 2014-07-22 DIAGNOSIS — Z87891 Personal history of nicotine dependence: Secondary | ICD-10-CM | POA: Insufficient documentation

## 2014-07-22 DIAGNOSIS — I959 Hypotension, unspecified: Secondary | ICD-10-CM | POA: Diagnosis not present

## 2014-07-22 DIAGNOSIS — E119 Type 2 diabetes mellitus without complications: Secondary | ICD-10-CM | POA: Insufficient documentation

## 2014-07-22 DIAGNOSIS — I4891 Unspecified atrial fibrillation: Secondary | ICD-10-CM | POA: Insufficient documentation

## 2014-07-22 DIAGNOSIS — I953 Hypotension of hemodialysis: Secondary | ICD-10-CM

## 2014-07-22 DIAGNOSIS — Z7901 Long term (current) use of anticoagulants: Secondary | ICD-10-CM | POA: Insufficient documentation

## 2014-07-22 LAB — BASIC METABOLIC PANEL
Anion gap: 14 (ref 5–15)
BUN: 22 mg/dL (ref 6–23)
CHLORIDE: 97 meq/L (ref 96–112)
CO2: 26 meq/L (ref 19–32)
Calcium: 8.8 mg/dL (ref 8.4–10.5)
Creatinine, Ser: 3.68 mg/dL — ABNORMAL HIGH (ref 0.50–1.10)
GFR calc Af Amer: 13 mL/min — ABNORMAL LOW (ref >90)
GFR calc non Af Amer: 11 mL/min — ABNORMAL LOW (ref >90)
Glucose, Bld: 232 mg/dL — ABNORMAL HIGH (ref 70–99)
POTASSIUM: 3.9 meq/L (ref 3.7–5.3)
Sodium: 137 mEq/L (ref 137–147)

## 2014-07-22 LAB — CBC WITH DIFFERENTIAL/PLATELET
BASOS ABS: 0 10*3/uL (ref 0.0–0.1)
Basophils Relative: 0 % (ref 0–1)
EOS PCT: 1 % (ref 0–5)
Eosinophils Absolute: 0.1 10*3/uL (ref 0.0–0.7)
HCT: 36.9 % (ref 36.0–46.0)
Hemoglobin: 11.9 g/dL — ABNORMAL LOW (ref 12.0–15.0)
Lymphocytes Relative: 14 % (ref 12–46)
Lymphs Abs: 1.2 10*3/uL (ref 0.7–4.0)
MCH: 31.7 pg (ref 26.0–34.0)
MCHC: 32.2 g/dL (ref 30.0–36.0)
MCV: 98.4 fL (ref 78.0–100.0)
Monocytes Absolute: 0.7 10*3/uL (ref 0.1–1.0)
Monocytes Relative: 9 % (ref 3–12)
NEUTROS ABS: 6.4 10*3/uL (ref 1.7–7.7)
Neutrophils Relative %: 76 % (ref 43–77)
Platelets: 107 10*3/uL — ABNORMAL LOW (ref 150–400)
RBC: 3.75 MIL/uL — ABNORMAL LOW (ref 3.87–5.11)
RDW: 14 % (ref 11.5–15.5)
WBC: 8.4 10*3/uL (ref 4.0–10.5)

## 2014-07-22 LAB — I-STAT TROPONIN, ED: Troponin i, poc: 0 ng/mL (ref 0.00–0.08)

## 2014-07-22 LAB — PROTIME-INR
INR: 1.23 (ref 0.00–1.49)
Prothrombin Time: 15.5 seconds — ABNORMAL HIGH (ref 11.6–15.2)

## 2014-07-22 LAB — PHOSPHORUS: Phosphorus: 2.1 mg/dL — ABNORMAL LOW (ref 2.3–4.6)

## 2014-07-22 LAB — APTT: aPTT: 31 seconds (ref 24–37)

## 2014-07-22 LAB — MAGNESIUM: Magnesium: 2 mg/dL (ref 1.5–2.5)

## 2014-07-22 NOTE — ED Notes (Signed)
Pt wheeled out of ED via wheelchair, NAD 

## 2014-07-22 NOTE — Discharge Instructions (Signed)

## 2014-07-22 NOTE — ED Notes (Signed)
IV team nurse still at bedside and applying pressure to de-accessed site.

## 2014-07-22 NOTE — ED Notes (Signed)
MD Pfeiffer mae aware of patients trending low Blood Pressure.

## 2014-07-22 NOTE — ED Notes (Addendum)
IV team paged, need to de-access patients dialysis fistula.

## 2014-07-22 NOTE — ED Notes (Signed)
Per EMS, pt comes from dialysis with low BP and low heart rate. Pt A&OX4, NAD noted. Pt has h/o afib, controlled with meds. Pt denies chest pain, SOB, dizziness/lightheadness. Pt c/o n/v x1. Pt stable. VSS: BP 110/56, P52, R16, 98% O2 rm air, CBG 264. Pt goes to dialysis T, R, and F.

## 2014-07-22 NOTE — ED Provider Notes (Signed)
CSN: [id number]     Arrival date & time 07/22/14  1804 History   First MD Initiated Contact with Patient 07/22/14 1815     Chief Complaint  Patient presents with  . Hypotension  . Bradycardia     (Consider location/radiation/quality/duration/timing/severity/associated sxs/prior Treatment) HPI This patient is sent over from dialysis for reported low blood pressure and low heart rate. The patient denies that she has any associated symptoms. Verbal report from nursing staff is that there was a measured systolic blood pressure dialysis in the 80s. Patient denies chest pain shortness of breath dizziness. She has been feeling at her normal baseline. She has not been having problems with vomiting anorexia or diarrhea.  Past Medical History  Diagnosis Date  . Diabetes mellitus without complication   . Hypertension   . Hyperlipidemia   . Atrial fibrillation   . ESRD (end stage renal disease)     HD since 06/2000  . CAD (coronary artery disease)   . 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 History of nuclear stress test 10/2012    lexiscan; normal stress test; post-stress EF72%  . Peripheral arterial disease   . Osteoporosis    Past Surgical History  Procedure Laterality Date  . Av fistula placement  2007    L brachial cephalic   . Transthoracic echocardiogram  12/2010    EF=>55%, mod conc LVH; mild mitral annular calcif; mild MR; trace TR; trace PV regurg  . Lower extremity arterial doppler  10/02/2013    bilat ABIs could not be obtained (occluded vessels); bilat TBIs - no flow visualized; R mid SFA 70-99% diameter reduction; bilat runoff with posterior tibial, anterior tibial, peroneal arteries with occlusive disease   . Cardiac catheterization  12/19/2005    3 vessel disease with normal LV function, positive Cardiolite --> subsequent CABG (Dr. [name])  .  Coronary artery bypass graft  01/18/2006    LIMA to LAD, vein to diagonal, vein to marginal branch of Cfx, vein to distal RCA (Dr. Wayland Salinas)  . Carotid doppler  02/24/2009    R bulb 0-49% diameter refuction, R & L ICAs with significatn tortuosity (source of bruit?)    Family History  Problem Relation Age of Onset  . Heart disease Mother   . Heart attack Brother   . Breast cancer Sister   . Diabetes Sister   . Heart attack Sister   . Diabetes Child    History  Substance Use Topics  . Smoking status: Former Smoker    Quit date: 06/10/2009  . Smokeless tobacco: Never Used  . Alcohol Use: No   OB History   Grav Para Term Preterm Abortions TAB SAB Ect Mult Living                 Review of Systems 10 Systems reviewed and are negative for acute change except as noted in the HPI.    Allergies  Review of patient's allergies indicates no known allergies.  Home Medications   Prior to Admission medications   Medication Sig Start Date End Date Taking? Authorizing Provider  acetaminophen (TYLENOL) 325 MG tablet Take 325 mg by mouth every 6 (six) hours as needed for moderate pain.   Yes Historical Provider, MD  calcium acetate (PHOSLO) 667 MG capsule Take 1,334 mg by mouth 3 (three) times daily with meals.  12/21/13  Yes Historical Provider, MD  Calcium Carbonate (CALCIUM 600 PO) Take 1 tablet by mouth 3 (three) times daily.    Yes Historical Provider, MD  diltiazem (CARDIZEM CD) 240 MG 24 hr capsule Take 240 mg by mouth daily.   Yes Historical Provider, MD  insulin glargine (LANTUS) 100 UNIT/ML injection Inject into the skin at bedtime. 10 units twice a day   Yes Historical Provider, MD  levothyroxine (SYNTHROID, LEVOTHROID) 200 MCG tablet Take 200 mcg by mouth daily before breakfast.   Yes Historical Provider, MD  levothyroxine (SYNTHROID, LEVOTHROID) 25 MCG tablet Take 50 mcg by mouth daily before breakfast.    Yes Historical Provider, MD  lidocaine-prilocaine (EMLA) cream Apply 1  application topically Every Tuesday,Thursday,and Saturday with dialysis.   Yes Historical Provider, MD  metoprolol succinate (TOPROL-XL) 50 MG 24 hr tablet Take 25 mg by mouth daily. Take with or immediately following a meal.   Yes Historical Provider, MD  OVER THE COUNTER MEDICATION Apply 1 application topically Every Tuesday,Thursday,and Saturday with dialysis. Diabetic foot cream used for HD   Yes Historical Provider, MD  pravastatin (PRAVACHOL) 40 MG tablet Take 40 mg by mouth every morning.    Yes Historical Provider, MD  warfarin (COUMADIN) 3 MG tablet Take 3-4.5 mg by mouth daily at 6 PM. Takes  on tues, thurs and Sat Takes 4.5mg  on all other days   Yes Historical Provider, MD   BP 109/56  Pulse 57  Temp(Src) 97.9 F (36.6 C) (Oral)  Resp 21  Ht  (1.702 m)  Wt 164 lb (74.39 kg)  BMI 25.68 kg/m2  SpO2 98% Physical Exam  Constitutional: She is oriented to person, place, and time. She appears well-developed and well-nourished.  HENT:  Head: Normocephalic and atraumatic.  Eyes: EOM are normal. Pupils are equal, round, and reactive to light.  The patient has exophthalmus, extraocular motions are intact.  Neck: Neck supple.  Cardiovascular: Normal rate, regular rhythm and intact distal pulses.   Murmur heard. The patient is a 3-4/6 systolic ejection murmur. There are occasional ectopic beats. Heart rate on the monitor is sinus in the mid 60s.  Pulmonary/Chest: Effort normal and breath sounds normal.  Abdominal: Soft. Bowel sounds are normal. She exhibits no distension. There is no tenderness.  Musculoskeletal: Normal range of motion. She exhibits no edema.  Neurological: She is alert and oriented to person, place, and time. She has normal strength. Coordination normal. GCS eye subscore is 4. GCS verbal subscore is 5. GCS motor subscore is 6.  Skin: Skin is warm, dry and intact.  Psychiatric: She has a normal mood and affect.     ED Course  Procedures (including critical  care time) Labs Review Labs Reviewed  BASIC METABOLIC PANEL - Abnormal; Notable for the following:    Glucose, Bld 232 (*)    Creatinine, Ser 3.68 (*)    GFR calc non Af Amer 11 (*)    GFR calc Af Amer 13 (*)    All other components within normal limits  CBC WITH DIFFERENTIAL - Abnormal; Notable for the following:    RBC 3.75 (*)    Hemoglobin 11.9 (*)    Platelets 107 (*)    All other components within normal limits  PROTIME-INR - Abnormal; Notable for the following:    Prothrombin Time 15.5 (*)    All other components within normal limits  PHOSPHORUS - Abnormal; Notable for the following:    Phosphorus 2.1 (*)    All other  components within normal limits  APTT  MAGNESIUM  I-STAT TROPOININ, ED    Imaging Review Dg Chest Port 1 View  07/22/2014   CLINICAL DATA:  Bradycardia.  Hypertension.  EXAM: PORTABLE CHEST - 1 VIEW  COMPARISON:  01/07/2008.  FINDINGS: Severe cardiomegaly. Prior CABG. Mild pulmonary vascular prominence i an nterstitial prominence suggesting mild congestive heart failure. Small pleural effusions. No pneumothorax. No acute bony abnormality.  IMPRESSION: 1. Mild congestive heart failure. 2. Severe cardiomegaly again noted.  Prior CABG.   Electronically Signed   By: Maisie Fus  Register   On: 07/22/2014 19:46     EKG Interpretation None      MDM   Final diagnoses:  Hypotension of hemodialysis   This patient is sent from dialysis for hypotension and bradycardia. Her vital signs have been stable since in emergency department. Diagnostic studies do not show any significant abnormality compared with baseline. Patient was asymptomatic at the time and feels normal. At this point I do feel she is safe for discharge. I suspect her symptoms are due to some degree of hypovolemia during dialysis. Also she is on several antihypertensive medications which could have been contributing to the episode. However this point time she has been in good condition and  asymptomatic.    Arby Barrette, MD 07/22/14 2156

## 2014-07-22 NOTE — ED Notes (Signed)
IV team at bedside 

## 2014-07-23 ENCOUNTER — Ambulatory Visit (INDEPENDENT_AMBULATORY_CARE_PROVIDER_SITE_OTHER): Payer: Medicare PPO | Admitting: Pharmacist Clinician (PhC)/ Clinical Pharmacy Specialist

## 2014-07-23 ENCOUNTER — Telehealth: Payer: Self-pay | Admitting: Cardiovascular Disease

## 2014-07-23 DIAGNOSIS — Z7901 Long term (current) use of anticoagulants: Secondary | ICD-10-CM

## 2014-07-23 NOTE — Telephone Encounter (Signed)
Received call from patient's daughter Megan Vasquez she stated after dialysis yesterday 07/22/14 mother's B/P low and heart rate low in the 40's.Stated mother was taken to [facility] ER.Stated ER Dr.advised to call Dr.Berry he may want to decrease B/P medication.Stated B/P at home this morning 127/75. Message sent to [facility] for advice.

## 2014-07-23 NOTE — Telephone Encounter (Signed)
Pt was taken from dialysis yesterday to the ER. Her blood pressure was low and heart rate also was low.They advised her to contact Dr Allyson Sabal and let him know her blood pressure medicine might need to be decreased.

## 2014-07-23 NOTE — Telephone Encounter (Signed)
Returned call to patient's daughter [name] no answer.LMTC.

## 2014-07-25 NOTE — Telephone Encounter (Signed)
Please have patient come in to see mid-level provider to adjust her medications

## 2014-07-25 NOTE — Telephone Encounter (Signed)
Returned call to patient's daughter Dellie Burns advised needs appointment with extender to adjust medication.Appointment scheduled with Wilburt Finlay PA 08/15/14 at 10:00 am.

## 2014-08-07 LAB — PROTIME-INR: INR: 1.7 — AB (ref 0.9–1.1)

## 2014-08-11 ENCOUNTER — Ambulatory Visit (INDEPENDENT_AMBULATORY_CARE_PROVIDER_SITE_OTHER): Payer: Medicare PPO | Admitting: Pharmacist Clinician (PhC)/ Clinical Pharmacy Specialist

## 2014-08-11 DIAGNOSIS — Z7901 Long term (current) use of anticoagulants: Secondary | ICD-10-CM

## 2014-08-15 ENCOUNTER — Ambulatory Visit (INDEPENDENT_AMBULATORY_CARE_PROVIDER_SITE_OTHER): Payer: Medicare PPO | Admitting: Physician Assistant

## 2014-08-15 ENCOUNTER — Encounter: Payer: Self-pay | Admitting: Physician Assistant

## 2014-08-15 ENCOUNTER — Ambulatory Visit (INDEPENDENT_AMBULATORY_CARE_PROVIDER_SITE_OTHER): Payer: Medicare PPO | Admitting: Pharmacist Clinician (PhC)/ Clinical Pharmacy Specialist

## 2014-08-15 VITALS — BP 166/83 | HR 100 | Ht 67.0 in | Wt 146.6 lb

## 2014-08-15 DIAGNOSIS — I482 Chronic atrial fibrillation, unspecified: Secondary | ICD-10-CM

## 2014-08-15 DIAGNOSIS — I2510 Atherosclerotic heart disease of native coronary artery without angina pectoris: Secondary | ICD-10-CM

## 2014-08-15 DIAGNOSIS — E785 Hyperlipidemia, unspecified: Secondary | ICD-10-CM

## 2014-08-15 DIAGNOSIS — Z7901 Long term (current) use of anticoagulants: Secondary | ICD-10-CM

## 2014-08-15 DIAGNOSIS — N186 End stage renal disease: Secondary | ICD-10-CM

## 2014-08-15 DIAGNOSIS — I10 Essential (primary) hypertension: Secondary | ICD-10-CM

## 2014-08-15 LAB — POCT INR: INR: 1.8

## 2014-08-15 NOTE — Patient Instructions (Signed)
1.  Follow up with Dr. Allyson SabalBerry in December

## 2014-08-15 NOTE — Assessment & Plan Note (Signed)
Rate controlled. No changes in current therapy. Today's INR is 1.8 and being managed by Phillips HayKristin Alvstad

## 2014-08-15 NOTE — Assessment & Plan Note (Signed)
Continue statin. 

## 2014-08-15 NOTE — Progress Notes (Signed)
Date:  08/15/2014   ID:  [name], DOB January 11, 1941, MRN [id number]  PCP:  [name], MD  Primary Cardiologist:  Allyson Sabal    History of Present Illness: Berma Harts is a 73 y.o. female Naryah Clenney is a 72y.o. female The patient is a very pleasant 73 year old thin-appearing divorced African American female, mother of 3 and grandmother to 1 grandchild. She has a history of CAD, status post coronary artery bypass grafting by Dr. Evelene Croon January 18, 2006, with a LIMA to her LAD, a vein to a diagonal branch, marginal branch of the circumflex, and distal RCA. Other history includes hypertension and hyperlipidemia. She does have chronic renal insufficiency, on hemodialysis since August 2001. Her postoperative course was complicated by atrial fibrillation with an embolic stroke. She has been on Coumadin anticoagulation since. Last Myoview 10/2012 revealed normal perfusion and LV function. The dialysis center apparently follows her lipid profile.  She gets dialysis on T, Th, Sat.   The patient presents today for followup after having a problem with hypotension during dialysis on September 8. She was seen in the ER and her vital signs were stable. No changes were made.  Today she reports doing quite well she's had no problems at dialysis since that time. Her blood pressure typically gets elevated when she is at the doctor's office as it is today.  She denies nausea, vomiting, fever, chest pain, shortness of breath, orthopnea, dizziness, PND, cough, congestion, abdominal pain, hematochezia, melena, lower extremity edema.  Her weight appears stable compared to September 2.  Wt Readings from Last 3 Encounters:  08/15/14 146 lb 9.6 oz (66.497 kg)  07/22/14 164 lb (74.39 kg)  07/16/14 145 lb (65.772 kg)     Past Medical History  Diagnosis Date  . Diabetes mellitus without complication   . Hypertension   . Hyperlipidemia   . Atrial fibrillation   . ESRD (end stage renal disease)      HD since 06/2000  . CAD (coronary artery disease)   . 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 History of nuclear stress test 10/2012    lexiscan; normal stress test; post-stress EF72%  . Peripheral arterial disease   . Osteoporosis     Current Outpatient Prescriptions  Medication Sig Dispense Refill  . acetaminophen (TYLENOL) 325 MG tablet Take 325 mg by mouth every 6 (six) hours as needed for moderate pain.      . calcium acetate (PHOSLO) 667 MG capsule Take 1,334 mg by mouth 3 (three) times daily with meals.       . Calcium Carbonate (CALCIUM 600 PO) Take 1 tablet by mouth 3 (three) times daily.       Marland Kitchen diltiazem (CARDIZEM CD) 240 MG 24 hr capsule Take 240 mg by mouth daily.      . insulin glargine (LANTUS) 100 UNIT/ML injection Inject into the skin at bedtime. 10 units twice a day      . levothyroxine (SYNTHROID, LEVOTHROID) 200 MCG tablet Take 200 mcg by mouth daily before breakfast.      . levothyroxine (SYNTHROID, LEVOTHROID) 25 MCG tablet Take 50 mcg by mouth daily before breakfast.       . lidocaine-prilocaine (EMLA) cream Apply 1 application topically Every Tuesday,Thursday,and Saturday with dialysis.      Marland Kitchen metoprolol succinate (TOPROL-XL) 50 MG 24 hr tablet Take  25 mg by mouth daily. Take with or immediately following a meal.      . OVER THE COUNTER MEDICATION Apply 1 application topically Every Tuesday,Thursday,and Saturday with dialysis. Diabetic foot cream used for HD      . pravastatin (PRAVACHOL) 40 MG tablet Take 40 mg by mouth every morning.       . warfarin (COUMADIN) 3 MG tablet Take 3-4.5 mg by mouth daily at 6 PM. Takes 3mg  on tues, thurs and Sat Takes 4.5mg  on all other days       No current facility-administered medications for this visit.    Allergies:   No Known Allergies  Social History:  The patient  reports that she quit  smoking about 5 years ago. She has never used smokeless tobacco. She reports that she does not drink alcohol or use illicit drugs.   Family history:   Family History  Problem Relation Age of Onset  . Heart disease Mother   . Heart attack Brother   . Breast cancer Sister   . Diabetes Sister   . Heart attack Sister   . Diabetes Child     ROS:  Please see the history of present illness.  All other systems reviewed and negative.   PHYSICAL EXAM: VS:  BP 166/83  Pulse 100  Ht 5\' 7"  (1.702 m)  Wt 146 lb 9.6 oz (66.497 kg)  BMI 22.96 kg/m2 Well nourished, well developed, in no acute distress HEENT: Pupils are equal round react to light accommodation extraocular movements are intact.  Neck: no JVDNo cervical lymphadenopathy. Cardiac: Irregular rate and rhythm 3/6 systolic murmur loudest at the apex Lungs:  clear to auscultation bilaterally, no wheezing, rhonchi or rales Abd: soft, nontender, positive bowel sounds all quadrants, no hepatosplenomegaly Ext: no lower extremity edema.  2+ radial pulses. Skin: warm and dry Neuro:  Grossly normal  EKG:  Atrial fibrillation with a rate of 80 beats per minute  ASSESSMENT AND PLAN:  Problem List Items Addressed This Visit   Atrial fibrillation     Rate controlled. No changes in current therapy. Today's INR is 1.8 and being managed by Phillips HayKristin Alvstad    Coronary artery disease     No complaints of angina    ESRD (end stage renal disease)   HLD (hyperlipidemia)     Continue statin    HTN (hypertension)     BP is elevated today however, there is some degree of anxiety when she comes to the office.  Pressure apparently has done much better had dialysis for the last several sessions and not to make any changes. Is likely that she was hypovolemic at the time.     Long term current use of anticoagulant therapy    Other Visit Diagnoses   Chronic a-fib    -  Primary    Relevant Orders       EKG 12-Lead

## 2014-08-15 NOTE — Assessment & Plan Note (Signed)
BP is elevated today however, there is some degree of anxiety when she comes to the office.  Pressure apparently has done much better had dialysis for the last several sessions and not to make any changes. Is likely that she was hypovolemic at the time.

## 2014-08-15 NOTE — Assessment & Plan Note (Signed)
No complaints of angina. 

## 2014-08-21 LAB — PROTIME-INR: INR: 2.4 — AB (ref 0.9–1.1)

## 2014-08-28 LAB — PROTIME-INR: INR: 2.5 — AB (ref 0.9–1.1)

## 2014-09-02 LAB — PROTIME-INR: INR: 2.5 — AB (ref <=1.1)

## 2014-09-03 ENCOUNTER — Ambulatory Visit (INDEPENDENT_AMBULATORY_CARE_PROVIDER_SITE_OTHER): Payer: Medicare PPO | Admitting: Pharmacist Clinician (PhC)/ Clinical Pharmacy Specialist

## 2014-09-03 DIAGNOSIS — I482 Chronic atrial fibrillation, unspecified: Secondary | ICD-10-CM

## 2014-09-03 DIAGNOSIS — Z7901 Long term (current) use of anticoagulants: Secondary | ICD-10-CM

## 2014-09-11 LAB — PROTIME-INR
INR: 2.8 — AB (ref 0.9–1.1)
INR: 2.8 — AB (ref <=1.1)

## 2014-09-15 ENCOUNTER — Ambulatory Visit (INDEPENDENT_AMBULATORY_CARE_PROVIDER_SITE_OTHER): Payer: Medicare PPO | Admitting: Pharmacist Clinician (PhC)/ Clinical Pharmacy Specialist

## 2014-09-15 DIAGNOSIS — I482 Chronic atrial fibrillation, unspecified: Secondary | ICD-10-CM

## 2014-09-15 DIAGNOSIS — Z7901 Long term (current) use of anticoagulants: Secondary | ICD-10-CM

## 2014-09-18 LAB — PROTIME-INR
INR: 4.1 — AB (ref <=1.1)
INR: 4.1 — AB (ref 0.9–1.1)

## 2014-09-19 ENCOUNTER — Ambulatory Visit (INDEPENDENT_AMBULATORY_CARE_PROVIDER_SITE_OTHER): Payer: Medicare PPO | Admitting: Pharmacist Clinician (PhC)/ Clinical Pharmacy Specialist

## 2014-09-19 ENCOUNTER — Telehealth: Payer: Self-pay | Admitting: Pharmacist Clinician (PhC)/ Clinical Pharmacy Specialist

## 2014-09-19 DIAGNOSIS — I482 Chronic atrial fibrillation, unspecified: Secondary | ICD-10-CM

## 2014-09-19 DIAGNOSIS — Z7901 Long term (current) use of anticoagulants: Secondary | ICD-10-CM

## 2014-09-19 NOTE — Telephone Encounter (Signed)
Angie would like to relay elevated INR results on this patient.

## 2014-09-19 NOTE — Telephone Encounter (Signed)
INR yesterday prior to dialysis was 4.07  Patient had prolonged bleeding after dialysis.Rochele Pages. Took about 20 more minutes than usual to stop bleeding.   Deferred to Belenda CruiseKristin to advise.

## 2014-09-19 NOTE — Telephone Encounter (Signed)
See anticoag note

## 2014-09-25 LAB — PROTIME-INR: INR: 1.9 — AB (ref 0.9–1.1)

## 2014-09-29 ENCOUNTER — Ambulatory Visit (INDEPENDENT_AMBULATORY_CARE_PROVIDER_SITE_OTHER): Payer: Medicare PPO | Admitting: Pharmacist Clinician (PhC)/ Clinical Pharmacy Specialist

## 2014-09-29 DIAGNOSIS — Z7901 Long term (current) use of anticoagulants: Secondary | ICD-10-CM

## 2014-09-29 DIAGNOSIS — I482 Chronic atrial fibrillation, unspecified: Secondary | ICD-10-CM

## 2014-10-14 ENCOUNTER — Ambulatory Visit (INDEPENDENT_AMBULATORY_CARE_PROVIDER_SITE_OTHER): Payer: Medicare PPO | Admitting: Pharmacist Clinician (PhC)/ Clinical Pharmacy Specialist

## 2014-10-14 ENCOUNTER — Telehealth: Payer: Self-pay | Admitting: Cardiovascular Disease

## 2014-10-14 DIAGNOSIS — Z7901 Long term (current) use of anticoagulants: Secondary | ICD-10-CM

## 2014-10-14 DIAGNOSIS — I482 Chronic atrial fibrillation, unspecified: Secondary | ICD-10-CM

## 2014-10-14 LAB — PROTIME-INR: INR: 3.1 — AB (ref 0.9–1.1)

## 2014-10-14 NOTE — Telephone Encounter (Signed)
See anticoag note

## 2014-10-14 NOTE — Telephone Encounter (Signed)
INR 3.13   Routed to UtuadoKristin, PharmD to advise

## 2014-10-15 ENCOUNTER — Encounter: Payer: Self-pay | Admitting: Cardiovascular Disease

## 2014-10-15 ENCOUNTER — Ambulatory Visit (INDEPENDENT_AMBULATORY_CARE_PROVIDER_SITE_OTHER): Payer: Medicare PPO | Admitting: Cardiovascular Disease

## 2014-10-15 VITALS — BP 140/70 | HR 86 | Ht 67.0 in | Wt 145.9 lb

## 2014-10-15 DIAGNOSIS — I482 Chronic atrial fibrillation, unspecified: Secondary | ICD-10-CM

## 2014-10-15 DIAGNOSIS — I340 Nonrheumatic mitral (valve) insufficiency: Secondary | ICD-10-CM | POA: Insufficient documentation

## 2014-10-15 DIAGNOSIS — I2510 Atherosclerotic heart disease of native coronary artery without angina pectoris: Secondary | ICD-10-CM

## 2014-10-15 DIAGNOSIS — I10 Essential (primary) hypertension: Secondary | ICD-10-CM

## 2014-10-15 DIAGNOSIS — E785 Hyperlipidemia, unspecified: Secondary | ICD-10-CM

## 2014-10-15 DIAGNOSIS — R011 Cardiac murmur, unspecified: Secondary | ICD-10-CM

## 2014-10-15 NOTE — Assessment & Plan Note (Signed)
History of CAD status post coronary artery bypass grafting by Dr. Rexanne ManoBrian Bartle 01/18/06 with a LIMA to the LAD, vein to diagonal branch, marginal branch and distal RCA. Her last Myoview performed 11/02/12 was nonischemic. She denies chest pain or shortness of breath.

## 2014-10-15 NOTE — Progress Notes (Signed)
10/15/2014 Megan Vasquez   07/05/1941  [id number]  Primary Physician Gwynneth AlimentSANDERS,ROBYN N, MD Primary Cardiologist: Runell GessJonathan J. Berania Peedin MD [place]ACP,FACC,FAHA, [place]   HPI:  Megan Vasquez is a 73y.o. female The patient is a very pleasant 73 year old thin-appearing divorced African American female, mother of 3 and grandmother to 1 grandchild. I last saw her one year ago and she saw Huey BienenstockBrian Hager PA-C 08/15/14.Marland Kitchen.She has a history of CAD, status post coronary artery bypass grafting by Dr. Evelene CroonBryan Bartle January 18, 2006, with a LIMA to her LAD, a vein to a diagonal branch, marginal branch of the circumflex, and distal RCA. Other history includes hypertension and hyperlipidemia. She does have chronic renal insufficiency, on hemodialysis since August 2001. Her postoperative course was complicated by atrial fibrillation with an embolic stroke. She has been on Coumadin anticoagulation since. Last Myoview 10/2012 revealed normal perfusion and LV function. The dialysis center apparently follows her lipid profile.  The patient presents today for a six month follow up. She gets dialysis on T, Th, Sat. She denies nausea, vomiting, fever, chest pain, shortness of breath, orthopnea, dizziness, PND, cough, congestion, abdominal pain, hematochezia, melena, lower extremity edema, claudication. Since I saw her one year ago she remained currently stable. She was having some problems with hypotension after dialysis however this has been resolved.  Current Outpatient Prescriptions  Medication Sig Dispense Refill  . acetaminophen (TYLENOL) 325 MG tablet Take 325 mg by mouth every 6 (six) hours as needed for moderate pain.    . calcium acetate (PHOSLO) 667 MG capsule Take 1,334 mg by mouth 3 (three) times daily with meals.     . Calcium Carbonate (CALCIUM 600 PO) Take 1 tablet by mouth 3 (three) times daily.     Marland Kitchen. diltiazem (CARDIZEM CD) 240 MG 24 hr capsule Take 240 mg by mouth daily.    . insulin glargine (LANTUS) 100  UNIT/ML injection Inject into the skin at bedtime. 10 units twice a day    . levothyroxine (SYNTHROID, LEVOTHROID) 200 MCG tablet Take 200 mcg by mouth daily before breakfast.    . levothyroxine (SYNTHROID, LEVOTHROID) 50 MCG tablet Take 50 mcg by mouth daily.    Marland Kitchen. lidocaine-prilocaine (EMLA) cream Apply 1 application topically Every Tuesday,Thursday,and Saturday with dialysis.    Marland Kitchen. metoprolol succinate (TOPROL-XL) 50 MG 24 hr tablet Take 25 mg by mouth daily. Take with or immediately following a meal.    . OVER THE COUNTER MEDICATION Apply 1 application topically Every Tuesday,Thursday,and Saturday with dialysis. Diabetic foot cream used for HD    . pravastatin (PRAVACHOL) 40 MG tablet Take 40 mg by mouth every morning.     . warfarin (COUMADIN) 3 MG tablet Take 3-4.5 mg by mouth daily at 6 PM. Takes 3mg  on tues, thurs and Sat Takes 4.5mg  on all other days     No current facility-administered medications for this visit.    No Known Allergies  History   Social History  . Marital Status: Divorced    Spouse Name: N/A    Number of Children: 3  . Years of Education: 12   Occupational History  .     Social History Main Topics  . Smoking status: Former Smoker    Quit date: 06/10/2009  . Smokeless tobacco: Never Used  . Alcohol Use: No  . Drug Use: No  . Sexual Activity: Not on file   Other Topics Concern  . Not on file   Social History Narrative     Review of  Systems: General: negative for chills, fever, night sweats or weight changes.  Cardiovascular: negative for chest pain, dyspnea on exertion, edema, orthopnea, palpitations, paroxysmal nocturnal dyspnea or shortness of breath Dermatological: negative for rash Respiratory: negative for cough or wheezing Urologic: negative for hematuria Abdominal: negative for nausea, vomiting, diarrhea, bright red blood per rectum, melena, or hematemesis Neurologic: negative for visual changes, syncope, or dizziness All other systems  reviewed and are otherwise negative except as noted above.    Blood pressure 140/70, pulse 86, height 5\' 7"  (1.702 m), weight 145 lb 14.4 oz (66.18 kg).  General appearance: alert and no distress Neck: no adenopathy, no carotid bruit, no JVD, supple, symmetrical, trachea midline and thyroid not enlarged, symmetric, no tenderness/mass/nodules Lungs: clear to auscultation bilaterally Heart: irregularly irregular rhythm and 2/6 high-pitched apical systolic murmur consistent with mitral regurgitation Extremities: extremities normal, atraumatic, no cyanosis or edema  EKG H the fibrillation with with a ventricular response of 86 and nonspecific ST and T-wave changes. I personally reviewed this EKG  ASSESSMENT AND PLAN:   Atrial fibrillation Chronic atrial fibrillation rate controlled on Coumadin anticoagulation and metoprolol.  HTN (hypertension) History of hypertension with blood pressure measured in the office today 140/70. She is on metoprolol and Cardizem CD 240 mg. She was having hypotension during dialysis however this doesn't seem to be a problem recently. Continue current medications at current dosing  Coronary artery disease History of CAD status post coronary artery bypass grafting by Dr. Rexanne ManoBrian Bartle 01/18/06 with a LIMA to the LAD, vein to diagonal branch, marginal branch and distal RCA. Her last Myoview performed 11/02/12 was nonischemic. She denies chest pain or shortness of breath.  HLD (hyperlipidemia) History of hyperlipidemia on pravastatin 40 mg a day. This is followed by her PCP. We will obtain copies of her murmur most recent lab work  Cardiac murmur She has a loud high-pitched apical systolic murmur consistent with mitral regurgitation. Her last echo was performed 2012 showed mild MR. I'm going to repeat her 2-D echocardiogram      Runell GessJonathan J. Diesel Lina MD [facility] CenterFACP,FACC,FAHA, [facility] CenterFSCAI 10/15/2014 10:30 AM

## 2014-10-15 NOTE — Patient Instructions (Signed)
Dr. Allyson SabalBerry has ordered for you to have   Echocardiogram. Echocardiography is a painless test that uses sound waves to create images of your heart. It provides your doctor with information about the size and shape of your heart and how well your heart's chambers and valves are working. This procedure takes approximately one hour. There are no restrictions for this procedure.  We request that you follow-up in: 6 months with Wilburt FinlayBryan Hager, PA-C and in 1 year with Dr San MorelleBerry  You will receive a reminder letter in the mail two months in advance. If you don't receive a letter, please call our office to schedule the follow-up appointment.

## 2014-10-15 NOTE — Assessment & Plan Note (Signed)
History of hyperlipidemia on pravastatin 40 mg a day. This is followed by her PCP. We will obtain copies of her murmur most recent lab work

## 2014-10-15 NOTE — Assessment & Plan Note (Signed)
Chronic atrial fibrillation rate controlled on Coumadin anticoagulation and metoprolol.

## 2014-10-15 NOTE — Assessment & Plan Note (Signed)
She has a loud high-pitched apical systolic murmur consistent with mitral regurgitation. Her last echo was performed 2012 showed mild MR. I'm going to repeat her 2-D echocardiogram

## 2014-10-15 NOTE — Assessment & Plan Note (Signed)
History of hypertension with blood pressure measured in the office today 140/70. She is on metoprolol and Cardizem CD 240 mg. She was having hypotension during dialysis however this doesn't seem to be a problem recently. Continue current medications at current dosing

## 2014-10-22 ENCOUNTER — Ambulatory Visit (HOSPITAL_COMMUNITY)
Admission: RE | Admit: 2014-10-22 | Discharge: 2014-10-22 | Disposition: A | Discharge status: Home / Self Care | Payer: Medicare PPO | Source: Ambulatory Visit | Attending: Cardiovascular Disease | Admitting: Cardiovascular Disease

## 2014-10-22 DIAGNOSIS — I059 Rheumatic mitral valve disease, unspecified: Secondary | ICD-10-CM

## 2014-10-22 DIAGNOSIS — R011 Cardiac murmur, unspecified: Secondary | ICD-10-CM | POA: Diagnosis present

## 2014-10-22 NOTE — Progress Notes (Signed)
2D Echo Performed 12/12/2013    Masa Lubin, RCS  

## 2014-10-23 ENCOUNTER — Encounter (HOSPITAL_COMMUNITY): Payer: Self-pay | Admitting: Vascular Surgery

## 2014-10-24 ENCOUNTER — Telehealth: Payer: Self-pay | Admitting: Cardiovascular Disease

## 2014-10-24 NOTE — Telephone Encounter (Signed)
Megan Vasquez is wanting to kn ow the reason why [name] is needing to come in on Tuesday to See Dr. Allyson SabalBerry . Please call   Thanks

## 2014-10-24 NOTE — Telephone Encounter (Signed)
Megan Vasquez is returning the nurse's call in regards to echo results. Please call  Thanks

## 2014-10-27 NOTE — Telephone Encounter (Signed)
Returned call to patient's daughter Aggie Cosierheresa. She was seeking clarification on the echo results provided to her previously. She was told that her mother needed a valve replacement and that Dr. Allyson SabalBerry wanted to see her for follow up - OV 12/15. RN explained that her mother's mitral valve is leaky (and this is new) and that replacement of this valve is a treatment option, that would need to be discussed with patient, family and MD. If patient did not wish to proceed with surgery, then Dr. Allyson SabalBerry could order surveillance echocardiograms to monitor her MR for progression of this or based on symptoms of patient r/t to this. Daughter voiced understanding of RN's explanation.

## 2014-10-27 NOTE — Telephone Encounter (Signed)
Please call,concerning pt's echo results.

## 2014-10-28 ENCOUNTER — Encounter: Payer: Self-pay | Admitting: Cardiovascular Disease

## 2014-10-28 ENCOUNTER — Ambulatory Visit (INDEPENDENT_AMBULATORY_CARE_PROVIDER_SITE_OTHER): Payer: Medicare PPO | Admitting: Cardiovascular Disease

## 2014-10-28 DIAGNOSIS — I340 Nonrheumatic mitral (valve) insufficiency: Secondary | ICD-10-CM

## 2014-10-28 NOTE — Patient Instructions (Signed)
Dr. Allyson SabalBerry has ordered an Echocardiogram in 6 months. Echocardiography is a painless test that uses sound waves to create images of your heart. It provides your doctor with information about the size and shape of your heart and how well your heart's chambers and valves are working. This procedure takes approximately one hour. There are no restrictions for this procedure.  Please follow up with Dr. Allyson SabalBerry following the echocardiogram.

## 2014-10-28 NOTE — Assessment & Plan Note (Signed)
2-D echocardiogram performed 10/22/14 revealed normal LV size and function, mild aortic insufficiency, severe mitral regurgitation and severe biatrial enlargement. The MR and atrial enlargement were new since her previous echo performed 3 years ago. She is completely asymptomatic from this. We'll continue to follow her by 2-D 2-D echocardiography.

## 2014-10-28 NOTE — Progress Notes (Signed)
   10/28/2014 Megan Vasquez   11/30/1940  [id number]  Primary Physician SANDERS,ROBYN N, MD Primary Cardiologist:  J.  MD FACP,FACC,FAHA, FSCAI   HPI:  Megan Vasquez is a 73y.o. female The patient is a very pleasant 71-year-old thin-appearing divorced African American female, mother of 3 and grandmother to 1 grandchild. I last saw her 10/15/14. She is accompanied by family for family members today. She is here to discuss a 2-D echocardiogram performed 10/22/14..She has a history of CAD, status post coronary artery bypass grafting by Dr. Bryan Bartle January 18, 2006, with a LIMA to her LAD, a vein to a diagonal branch, marginal branch of the circumflex, and distal RCA. Other history includes hypertension and hyperlipidemia. She does have chronic renal insufficiency, on hemodialysis since August 2001. Her postoperative course was complicated by atrial fibrillation with an embolic stroke. She has been on Coumadin anticoagulation since. Last Myoview 10/2012 revealed normal perfusion and LV function. The dialysis center apparently follows her lipid profile.  The patient presents today for a six month follow up. She gets dialysis on T, Th, Sat. She denies nausea, vomiting, fever, chest pain, shortness of breath, orthopnea, dizziness, PND, cough, congestion, abdominal pain, hematochezia, melena, lower extremity edema, claudication.  Since I saw [name] weeks ago she's remained stable. She was complaining of some postdialysis hypotension which is rare. Because of a murmur which I auscultated a 2-D echogram was performed that showed the development of severe mitral regurgitation with biatrial enlargement. Her LV size and function are normal. She is asymptomatic.   Current Outpatient Prescriptions  Medication Sig Dispense Refill  . acetaminophen (TYLENOL) 325 MG tablet Take 325 mg by mouth every 6 (six) hours as needed for moderate pain.    . calcium acetate (PHOSLO) 667 MG capsule Take  1,334 mg by mouth 3 (three) times daily with meals.     . Calcium Carbonate (CALCIUM 600 PO) Take 1 tablet by mouth 3 (three) times daily.     . diltiazem (CARDIZEM CD) 240 MG 24 hr capsule Take 240 mg by mouth daily.    . insulin glargine (LANTUS) 100 UNIT/ML injection Inject into the skin at bedtime. 10 units twice a day    . levothyroxine (SYNTHROID, LEVOTHROID) 200 MCG tablet Take 200 mcg by mouth daily before breakfast.    . levothyroxine (SYNTHROID, LEVOTHROID) 50 MCG tablet Take 50 mcg by mouth daily.    . lidocaine-prilocaine (EMLA) cream Apply 1 application topically Every Tuesday,Thursday,and Saturday with dialysis.    . metoprolol succinate (TOPROL-XL) 50 MG 24 hr tablet Take 25 mg by mouth daily. Take with or immediately following a meal.    . OVER THE COUNTER MEDICATION Apply 1 application topically Every Tuesday,Thursday,and Saturday with dialysis. Diabetic foot cream used for HD    . pravastatin (PRAVACHOL) 40 MG tablet Take 40 mg by mouth every morning.     . warfarin (COUMADIN) 3 MG tablet Take 3-4.5 mg by mouth daily at 6 PM. Takes 3mg on tues, thurs and Sat Takes 4.5mg on all other days     No current facility-administered medications for this visit.    No Known Allergies  History   Social History  . Marital Status: Divorced    Spouse Name: N/A    Number of Children: 3  . Years of Education: 12   Occupational History  .     Social History Main Topics  . Smoking status: Former Smoker    Quit date: 06/10/2009  . Smokeless tobacco: Never   Used  . Alcohol Use: No  . Drug Use: No  . Sexual Activity: Not on file   Other Topics Concern  . Not on file   Social History Narrative     Review of Systems: General: negative for chills, fever, night sweats or weight changes.  Cardiovascular: negative for chest pain, dyspnea on exertion, edema, orthopnea, palpitations, paroxysmal nocturnal dyspnea or shortness of breath Dermatological: negative for rash Respiratory:  negative for cough or wheezing Urologic: negative for hematuria Abdominal: negative for nausea, vomiting, diarrhea, bright red blood per rectum, melena, or hematemesis Neurologic: negative for visual changes, syncope, or dizziness All other systems reviewed and are otherwise negative except as noted above.    Blood pressure 168/80, pulse 92, height 5' 7" (1.702 m), weight 150 lb 11.2 oz (68.357 kg).  General appearance: alert and no distress Neck: no adenopathy, no carotid bruit, no JVD, supple, symmetrical, trachea midline and thyroid not enlarged, symmetric, no tenderness/mass/nodules Lungs: clear to auscultation bilaterally Heart: soft diastolic murmur at left sternal border, 2 to 3/6 systolic murmur at the apex Extremities: extremities normal, atraumatic, no cyanosis or edema  EKG not performed today  ASSESSMENT AND PLAN:   Cardiac murmur 2-D echocardiogram performed 10/22/14 revealed normal LV size and function, mild aortic insufficiency, severe mitral regurgitation and severe biatrial enlargement. The MR and atrial enlargement were new since her previous echo performed 3 years ago. She is completely asymptomatic from this. We'll continue to follow her by 2-D 2-D echocardiography.      Lorretta Harp MD FACP,FACC,FAHA, [facility] 10/28/2014 9:22 AM

## 2014-10-31 LAB — PROTIME-INR: INR: 2 — AB (ref 0.9–1.1)

## 2014-11-03 ENCOUNTER — Ambulatory Visit (INDEPENDENT_AMBULATORY_CARE_PROVIDER_SITE_OTHER): Payer: Medicare PPO | Admitting: Pharmacist Clinician (PhC)/ Clinical Pharmacy Specialist

## 2014-11-03 DIAGNOSIS — I482 Chronic atrial fibrillation, unspecified: Secondary | ICD-10-CM

## 2014-11-03 DIAGNOSIS — Z7901 Long term (current) use of anticoagulants: Secondary | ICD-10-CM

## 2014-11-13 ENCOUNTER — Encounter (HOSPITAL_COMMUNITY): Payer: Self-pay

## 2014-11-13 ENCOUNTER — Emergency Department (HOSPITAL_COMMUNITY)
Admission: EM | Admit: 2014-11-13 | Discharge: 2014-11-13 | Disposition: A | Discharge status: Home / Self Care | Payer: Medicare PPO | Attending: Emergency Medicine | Admitting: Emergency Medicine

## 2014-11-13 DIAGNOSIS — T82838A Hemorrhage of vascular prosthetic devices, implants and grafts, initial encounter: Secondary | ICD-10-CM | POA: Diagnosis not present

## 2014-11-13 DIAGNOSIS — Y841 Kidney dialysis as the cause of abnormal reaction of the patient, or of later complication, without mention of misadventure at the time of the procedure: Secondary | ICD-10-CM | POA: Diagnosis not present

## 2014-11-13 DIAGNOSIS — M810 Age-related osteoporosis without current pathological fracture: Secondary | ICD-10-CM | POA: Diagnosis not present

## 2014-11-13 DIAGNOSIS — Z992 Dependence on renal dialysis: Secondary | ICD-10-CM | POA: Insufficient documentation

## 2014-11-13 DIAGNOSIS — E119 Type 2 diabetes mellitus without complications: Secondary | ICD-10-CM | POA: Diagnosis not present

## 2014-11-13 DIAGNOSIS — E785 Hyperlipidemia, unspecified: Secondary | ICD-10-CM | POA: Diagnosis not present

## 2014-11-13 DIAGNOSIS — Z9889 Other specified postprocedural states: Secondary | ICD-10-CM | POA: Insufficient documentation

## 2014-11-13 DIAGNOSIS — Z7901 Long term (current) use of anticoagulants: Secondary | ICD-10-CM | POA: Diagnosis not present

## 2014-11-13 DIAGNOSIS — Z87891 Personal history of nicotine dependence: Secondary | ICD-10-CM | POA: Diagnosis not present

## 2014-11-13 DIAGNOSIS — I120 Hypertensive chronic kidney disease with stage 5 chronic kidney disease or end stage renal disease: Secondary | ICD-10-CM | POA: Insufficient documentation

## 2014-11-13 DIAGNOSIS — I2510 Atherosclerotic heart disease of native coronary artery without angina pectoris: Secondary | ICD-10-CM | POA: Diagnosis not present

## 2014-11-13 DIAGNOSIS — I4891 Unspecified atrial fibrillation: Secondary | ICD-10-CM | POA: Diagnosis not present

## 2014-11-13 DIAGNOSIS — Z794 Long term (current) use of insulin: Secondary | ICD-10-CM | POA: Diagnosis not present

## 2014-11-13 DIAGNOSIS — N186 End stage renal disease: Secondary | ICD-10-CM | POA: Diagnosis not present

## 2014-11-13 DIAGNOSIS — Z79899 Other long term (current) drug therapy: Secondary | ICD-10-CM | POA: Insufficient documentation

## 2014-11-13 MED ORDER — ONDANSETRON 8 MG PO TBDP
8.0000 mg | ORAL_TABLET | Freq: Once | ORAL | Status: AC
  Administered 2014-11-13: 8 mg via ORAL
  Filled 2014-11-13: qty 1

## 2014-11-13 NOTE — ED Provider Notes (Signed)
CSN: [id number]     Arrival date & time 11/13/14  1846 History   First MD Initiated Contact with Patient 11/13/14 1849     Chief Complaint  Patient presents with  . Dialysis Fistula Bleed      (Consider location/radiation/quality/duration/timing/severity/associated sxs/prior Treatment) HPI   Megan Vasquez is a 73 y.o. female who is here for evaluation of bleeding from her left arm dialysis fistula, which started after dialysis today.  She denies weakness, dizziness, chest pain, nausea or vomiting.  She has had problems with this, previously.  However, not recently.  She is taking her usual medications.  There are no other known modifying factors.   Past Medical History  Diagnosis Date  . Diabetes mellitus without complication   . Hypertension   . Hyperlipidemia   . Atrial fibrillation   . ESRD (end stage renal disease)     HD since 06/2000  . CAD (coronary artery disease)   . 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 History of nuclear stress test 10/2012    lexiscan; normal stress test; post-stress EF72%  . Peripheral arterial disease   . Osteoporosis   . Mitral regurgitation     severe by 2-D echocardiogram performed 10/22/14   Past Surgical History  Procedure Laterality Date  . Av fistula placement  2007    L brachial cephalic   . Transthoracic echocardiogram  12/2010    EF=>55%, mod conc LVH; mild mitral annular calcif; mild MR; trace TR; trace PV regurg  . Lower extremity arterial doppler  10/02/2013    bilat ABIs could not be obtained (occluded vessels); bilat TBIs - no flow visualized; R mid SFA 70-99% diameter reduction; bilat runoff with posterior tibial, anterior tibial, peroneal arteries with occlusive disease   . Cardiac catheterization  12/19/2005    3 vessel disease with normal LV function, positive Cardiolite --> subsequent CABG (Dr.  Erlene QuanJ. Berry)  . Coronary artery bypass graft  01/18/2006    LIMA to LAD, vein to diagonal, vein to marginal branch of Cfx, vein to distal RCA (Dr. Wayland SalinasB. Bartle)  . Carotid doppler  02/24/2009    R bulb 0-49% diameter refuction, R & L ICAs with significatn tortuosity (source of bruit?)   . Abdominal aortagram N/A 03/24/2014    Procedure: ABDOMINAL Ronny FlurryAORTAGRAM;  Surgeon: Chuck Hinthristopher S Dickson, MD;  [facility];  Service: Cardiovascular;  Laterality: N/A;   Family History  Problem Relation Age of Onset  . Heart disease Mother   . Heart attack Brother   . Breast cancer Sister   . Diabetes Sister   . Heart attack Sister   . Diabetes Child    History  Substance Use Topics  . Smoking status: Former Smoker    Quit date: 06/10/2009  . Smokeless tobacco: Never Used  . Alcohol Use: No   OB History    No data available     Review of Systems  All other systems reviewed and are negative.     Allergies  Review of patient's allergies indicates no known allergies.  Home Medications   Prior to Admission medications   Medication Sig Start Date End Date Taking? Authorizing Provider  acetaminophen (TYLENOL) 325 MG tablet Take 325 mg by mouth every 6 (six) hours as needed for moderate pain.   Yes Historical Provider, MD  calcium acetate (PHOSLO)  667 MG capsule Take 1,334 mg by mouth 3 (three) times daily with meals.  12/21/13  Yes Historical Provider, MD  diltiazem (CARDIZEM CD) 240 MG 24 hr capsule Take 240 mg by mouth daily.   Yes Historical Provider, MD  insulin glargine (LANTUS) 100 UNIT/ML injection Inject 10 Units into the skin 2 (two) times daily. 10 units twice a day   Yes Historical Provider, MD  levothyroxine (SYNTHROID, LEVOTHROID) 200 MCG tablet Take 200 mcg by mouth daily before breakfast.   Yes Historical Provider, MD  levothyroxine (SYNTHROID, LEVOTHROID) 50 MCG tablet Take 50 mcg by mouth daily. 10/14/14  Yes Historical Provider, MD  lidocaine-prilocaine (EMLA) cream Apply 1  application topically Every Tuesday,Thursday,and Saturday with dialysis.   Yes Historical Provider, MD  metoprolol succinate (TOPROL-XL) 50 MG 24 hr tablet Take 25 mg by mouth daily. Take with or immediately following a meal.   Yes Historical Provider, MD  pravastatin (PRAVACHOL) 40 MG tablet Take 40 mg by mouth every morning.    Yes Historical Provider, MD  warfarin (COUMADIN) 3 MG tablet Take 4.5-6 mg by mouth daily at 6 PM. Take 6mg  on Monday and Friday.  Take 4.5 mg on Tuesday, Wednesday, Thursday, Saturday, and Sunday.   Yes Historical Provider, MD  Calcium Carbonate (CALCIUM 600 PO) Take 1 tablet by mouth 3 (three) times daily.     Historical Provider, MD  OVER THE COUNTER MEDICATION Apply 1 application topically Every Tuesday,Thursday,and Saturday with dialysis. Diabetic foot cream used for HD    Historical Provider, MD   BP 119/61 mmHg  Pulse 58  Temp(Src) 97.7 F (36.5 C) (Oral)  Resp 22  SpO2 96% Physical Exam  Constitutional: She is oriented to person, place, and time. She appears well-developed and well-nourished.  HENT:  Head: Normocephalic and atraumatic.  Right Ear: External ear normal.  Left Ear: External ear normal.  Eyes: Conjunctivae and EOM are normal. Pupils are equal, round, and reactive to light.  Neck: Normal range of motion and phonation normal. Neck supple.  Cardiovascular: Normal rate.   Aneurysmal left upper arm fistula.  2.  Small puncture sites are visible, the lower one is oozing very minimal amount of blood.  Pressure dressing was placed on the bones.  Pulmonary/Chest: Effort normal. She exhibits no bony tenderness.  Musculoskeletal: Normal range of motion.  Neurological: She is alert and oriented to person, place, and time. No cranial nerve deficit or sensory deficit. She exhibits normal muscle tone. Coordination normal.  Skin: Skin is warm, dry and intact.  Psychiatric: She has a normal mood and affect. Her behavior is normal. Judgment and thought content  normal.  Nursing note and vitals reviewed.   ED Course  Procedures (including critical care time)  Medications  ondansetron (ZOFRAN-ODT) disintegrating tablet 8 mg (8 mg Oral Given 11/13/14 2149)    Patient Vitals for the past 24 hrs:  BP Temp Temp src Pulse Resp SpO2  11/13/14 2127 119/61 mmHg - - (!) 58 22 96 %  11/13/14 2030 121/57 mmHg - - 60 - 99 %  11/13/14 2000 112/72 mmHg - - 64 25 97 %  11/13/14 1840 113/70 mmHg 97.7 F (36.5 C) Oral 95 16 100 %    At D/C Reevaluation with update and discussion. After initial assessment and treatment, an updated evaluation reveals after 2 hour period of observation, no additional bleeding.  I discussed with patient and family members, all questions answered.Flint Melter. Anthoni Geerts L     Labs Review Labs Reviewed - No  data to display  Imaging Review No results found.   EKG Interpretation None      MDM   Final diagnoses:  Bleeding pseudoaneurysm of left brachiocephalic arteriovenous fistula, initial encounter    Bleeding left arm fistula, improved with pressure.  No indication for further evaluation or treatment in the ED or Hospital setting.   Nursing Notes Reviewed/ Care Coordinated Applicable Imaging Reviewed Interpretation of Laboratory Data incorporated into ED treatment  The patient appears reasonably screened and/or stabilized for discharge and I doubt any other medical condition or other [facility] requiring further screening, evaluation, or treatment in the ED at this time prior to discharge.  Plan: Home Medications- usual; Home Treatments- rest; return here if the recommended treatment, does not improve the symptoms; Recommended follow up- PCP prn    Flint Melter, MD 11/13/14 [phone]

## 2014-11-13 NOTE — ED Notes (Addendum)
Pt was at dialysis today, when she got home, she took off her coats and noted significant bleeding coming from left upper arm. Pt states she has bled through three towels. Pt takes coumadin.

## 2014-11-13 NOTE — Discharge Instructions (Signed)
If the bleeding, recurs, [facility].

## 2014-11-13 NOTE — ED Notes (Signed)
MD at bedside. 

## 2014-11-15 ENCOUNTER — Other Ambulatory Visit: Payer: Self-pay | Admitting: Cardiovascular Disease

## 2014-11-17 NOTE — Telephone Encounter (Signed)
Rx(s) sent to pharmacy electronically.  

## 2014-11-18 LAB — PROTIME-INR: INR: 2.6 — AB (ref 0.9–1.1)

## 2014-11-21 ENCOUNTER — Ambulatory Visit (INDEPENDENT_AMBULATORY_CARE_PROVIDER_SITE_OTHER): Payer: Medicare PPO | Admitting: Pharmacist Clinician (PhC)/ Clinical Pharmacy Specialist

## 2014-11-21 DIAGNOSIS — I482 Chronic atrial fibrillation, unspecified: Secondary | ICD-10-CM

## 2014-11-21 DIAGNOSIS — Z7901 Long term (current) use of anticoagulants: Secondary | ICD-10-CM

## 2014-12-02 ENCOUNTER — Other Ambulatory Visit: Payer: Self-pay | Admitting: Cardiovascular Disease

## 2014-12-02 ENCOUNTER — Telehealth: Payer: Self-pay | Admitting: Cardiovascular Disease

## 2014-12-02 NOTE — Telephone Encounter (Signed)
Pt need a new prescription for her Coumadin and refills. [facility] call to [phone].

## 2014-12-02 NOTE — Telephone Encounter (Signed)
Refilled

## 2014-12-05 LAB — PROTIME-INR: INR: 5.3 — AB (ref <=1.1)

## 2014-12-06 LAB — PROTIME-INR: INR: 3.8 — AB (ref <=1.1)

## 2014-12-08 ENCOUNTER — Ambulatory Visit (INDEPENDENT_AMBULATORY_CARE_PROVIDER_SITE_OTHER): Payer: Medicare PPO | Admitting: Pharmacist Clinician (PhC)/ Clinical Pharmacy Specialist

## 2014-12-08 DIAGNOSIS — Z7901 Long term (current) use of anticoagulants: Secondary | ICD-10-CM

## 2014-12-08 DIAGNOSIS — I482 Chronic atrial fibrillation, unspecified: Secondary | ICD-10-CM

## 2014-12-10 ENCOUNTER — Telehealth: Payer: Self-pay | Admitting: Cardiovascular Disease

## 2014-12-10 ENCOUNTER — Ambulatory Visit (INDEPENDENT_AMBULATORY_CARE_PROVIDER_SITE_OTHER): Payer: Medicare PPO | Admitting: Pharmacist Clinician (PhC)/ Clinical Pharmacy Specialist

## 2014-12-10 DIAGNOSIS — Z7901 Long term (current) use of anticoagulants: Secondary | ICD-10-CM

## 2014-12-10 DIAGNOSIS — I482 Chronic atrial fibrillation, unspecified: Secondary | ICD-10-CM

## 2014-12-10 NOTE — Telephone Encounter (Signed)
Angie from dialysis center reporting pt's INR 3.75. This down from last week (5.7)  She is faxing note.  Will route to [place] to advise.

## 2014-12-13 ENCOUNTER — Other Ambulatory Visit: Payer: Self-pay | Admitting: Pharmacist Clinician (PhC)/ Clinical Pharmacy Specialist

## 2014-12-24 LAB — PROTIME-INR: INR: 2.7 — AB (ref 0.9–1.1)

## 2014-12-25 LAB — PROTIME-INR
INR: 2.6 — AB (ref 0.9–1.1)
INR: 2.6 — AB (ref <=1.1)

## 2014-12-30 ENCOUNTER — Ambulatory Visit (INDEPENDENT_AMBULATORY_CARE_PROVIDER_SITE_OTHER): Payer: Medicare PPO | Admitting: Pharmacist Clinician (PhC)/ Clinical Pharmacy Specialist

## 2014-12-30 DIAGNOSIS — Z7901 Long term (current) use of anticoagulants: Secondary | ICD-10-CM

## 2014-12-30 DIAGNOSIS — I482 Chronic atrial fibrillation, unspecified: Secondary | ICD-10-CM

## 2015-01-05 ENCOUNTER — Telehealth: Payer: Self-pay | Admitting: Cardiovascular Disease

## 2015-01-05 MED ORDER — WARFARIN SODIUM 3 MG PO TABS: ORAL_TABLET | ORAL | Status: AC

## 2015-01-05 NOTE — Telephone Encounter (Signed)
°  1. Which medications need to be refilled? Warfarin  2. Which pharmacy is medication to be sent to?[phone]  3. Do they need a 30 day or 90 day supply? 90 and refills  4. Would they like a call back once the medication has been sent to the pharmacy? yes

## 2015-01-08 LAB — PROTIME-INR: INR: 2.2 — AB (ref <=1.1)

## 2015-01-09 ENCOUNTER — Ambulatory Visit (INDEPENDENT_AMBULATORY_CARE_PROVIDER_SITE_OTHER): Payer: Medicare PPO | Admitting: Pharmacist Clinician (PhC)/ Clinical Pharmacy Specialist

## 2015-01-09 DIAGNOSIS — I482 Chronic atrial fibrillation, unspecified: Secondary | ICD-10-CM

## 2015-01-09 DIAGNOSIS — Z7901 Long term (current) use of anticoagulants: Secondary | ICD-10-CM

## 2015-01-09 LAB — PROTIME-INR: INR: 2.2 — AB (ref 0.9–1.1)

## 2015-01-10 LAB — PROTIME-INR: INR: 2.6 — AB (ref <=1.1)

## 2015-01-13 ENCOUNTER — Telehealth: Payer: Self-pay

## 2015-01-13 NOTE — Telephone Encounter (Signed)
Received INR 2.60 [facility] on [facility]/10/15 at [facility].Sent to [place].

## 2015-01-14 ENCOUNTER — Ambulatory Visit (INDEPENDENT_AMBULATORY_CARE_PROVIDER_SITE_OTHER): Payer: Medicare PPO | Admitting: Pharmacist Clinician (PhC)/ Clinical Pharmacy Specialist

## 2015-01-14 DIAGNOSIS — I482 Chronic atrial fibrillation, unspecified: Secondary | ICD-10-CM

## 2015-01-14 DIAGNOSIS — Z7901 Long term (current) use of anticoagulants: Secondary | ICD-10-CM

## 2015-01-22 LAB — PROTIME-INR: INR: 2 — AB (ref <=1.1)

## 2015-01-23 ENCOUNTER — Ambulatory Visit (INDEPENDENT_AMBULATORY_CARE_PROVIDER_SITE_OTHER): Payer: Medicare PPO | Admitting: Pharmacist Clinician (PhC)/ Clinical Pharmacy Specialist

## 2015-01-23 DIAGNOSIS — I482 Chronic atrial fibrillation, unspecified: Secondary | ICD-10-CM

## 2015-01-23 DIAGNOSIS — Z7901 Long term (current) use of anticoagulants: Secondary | ICD-10-CM

## 2015-02-06 LAB — PROTIME-INR

## 2015-02-09 ENCOUNTER — Ambulatory Visit (INDEPENDENT_AMBULATORY_CARE_PROVIDER_SITE_OTHER): Payer: Medicare PPO | Admitting: *Deleted

## 2015-02-09 DIAGNOSIS — Z7901 Long term (current) use of anticoagulants: Secondary | ICD-10-CM

## 2015-02-09 DIAGNOSIS — I482 Chronic atrial fibrillation, unspecified: Secondary | ICD-10-CM

## 2015-02-10 ENCOUNTER — Encounter: Payer: Self-pay | Admitting: Cardiovascular Disease

## 2015-02-26 LAB — PROTIME-INR
INR: 1.9 — AB (ref 0.9–1.1)
INR: 1.9 — AB (ref <=1.1)

## 2015-03-02 ENCOUNTER — Ambulatory Visit (INDEPENDENT_AMBULATORY_CARE_PROVIDER_SITE_OTHER): Payer: Medicare PPO | Admitting: Pharmacist Clinician (PhC)/ Clinical Pharmacy Specialist

## 2015-03-02 DIAGNOSIS — Z7901 Long term (current) use of anticoagulants: Secondary | ICD-10-CM

## 2015-03-02 DIAGNOSIS — I482 Chronic atrial fibrillation, unspecified: Secondary | ICD-10-CM

## 2015-03-03 ENCOUNTER — Other Ambulatory Visit: Payer: Self-pay | Admitting: Cardiovascular Disease

## 2015-03-16 ENCOUNTER — Other Ambulatory Visit: Payer: Self-pay

## 2015-03-16 DIAGNOSIS — Z1231 Encounter for screening mammogram for malignant neoplasm of breast: Secondary | ICD-10-CM

## 2015-03-20 ENCOUNTER — Ambulatory Visit
Admission: RE | Admit: 2015-03-20 | Discharge: 2015-03-20 | Disposition: A | Discharge status: Home / Self Care | Payer: Medicare PPO | Source: Ambulatory Visit

## 2015-03-20 DIAGNOSIS — Z1231 Encounter for screening mammogram for malignant neoplasm of breast: Secondary | ICD-10-CM

## 2015-03-26 LAB — PROTIME-INR: INR: 2.1 — AB (ref 0.9–1.1)

## 2015-04-01 ENCOUNTER — Ambulatory Visit (INDEPENDENT_AMBULATORY_CARE_PROVIDER_SITE_OTHER): Payer: Medicare PPO | Admitting: Pharmacist Clinician (PhC)/ Clinical Pharmacy Specialist

## 2015-04-01 DIAGNOSIS — I482 Chronic atrial fibrillation, unspecified: Secondary | ICD-10-CM

## 2015-04-01 DIAGNOSIS — Z7901 Long term (current) use of anticoagulants: Secondary | ICD-10-CM

## 2015-04-09 LAB — PROTIME-INR: INR: 2.6 — AB (ref 0.9–1.1)

## 2015-04-16 ENCOUNTER — Ambulatory Visit (INDEPENDENT_AMBULATORY_CARE_PROVIDER_SITE_OTHER): Payer: Medicare PPO | Admitting: Pharmacist Clinician (PhC)/ Clinical Pharmacy Specialist

## 2015-04-16 DIAGNOSIS — Z7901 Long term (current) use of anticoagulants: Secondary | ICD-10-CM

## 2015-04-16 DIAGNOSIS — I482 Chronic atrial fibrillation, unspecified: Secondary | ICD-10-CM

## 2015-04-23 LAB — PROTIME-INR: INR: 2.2 — AB (ref 0.9–1.1)

## 2015-04-24 LAB — PROTIME-INR: INR: 2.2 — AB (ref <=1.1)

## 2015-04-27 ENCOUNTER — Ambulatory Visit (INDEPENDENT_AMBULATORY_CARE_PROVIDER_SITE_OTHER): Payer: Medicare PPO | Admitting: Pharmacist Clinician (PhC)/ Clinical Pharmacy Specialist

## 2015-04-27 DIAGNOSIS — I482 Chronic atrial fibrillation, unspecified: Secondary | ICD-10-CM

## 2015-04-27 DIAGNOSIS — Z7901 Long term (current) use of anticoagulants: Secondary | ICD-10-CM

## 2015-05-16 ENCOUNTER — Other Ambulatory Visit: Payer: Self-pay | Admitting: Cardiovascular Disease

## 2015-05-19 LAB — PROTIME-INR: INR: 2.2 — AB (ref 0.9–1.1)

## 2015-05-19 NOTE — Telephone Encounter (Signed)
Rx(s) sent to pharmacy electronically.  

## 2015-05-22 ENCOUNTER — Ambulatory Visit (INDEPENDENT_AMBULATORY_CARE_PROVIDER_SITE_OTHER): Payer: Medicare PPO | Admitting: Pharmacist Clinician (PhC)/ Clinical Pharmacy Specialist

## 2015-05-22 DIAGNOSIS — I482 Chronic atrial fibrillation, unspecified: Secondary | ICD-10-CM

## 2015-05-22 DIAGNOSIS — Z7901 Long term (current) use of anticoagulants: Secondary | ICD-10-CM

## 2015-06-18 LAB — PROTIME-INR: INR: 2.7 — AB (ref 0.9–1.1)

## 2015-06-22 ENCOUNTER — Ambulatory Visit (INDEPENDENT_AMBULATORY_CARE_PROVIDER_SITE_OTHER): Payer: Medicare PPO | Admitting: Pharmacist Clinician (PhC)/ Clinical Pharmacy Specialist

## 2015-06-22 DIAGNOSIS — I482 Chronic atrial fibrillation, unspecified: Secondary | ICD-10-CM

## 2015-06-22 DIAGNOSIS — Z7901 Long term (current) use of anticoagulants: Secondary | ICD-10-CM

## 2015-06-23 LAB — PROTIME-INR: INR: 4.5 — AB (ref 0.9–1.1)

## 2015-06-25 ENCOUNTER — Ambulatory Visit (INDEPENDENT_AMBULATORY_CARE_PROVIDER_SITE_OTHER): Payer: Medicare PPO | Admitting: Pharmacist Clinician (PhC)/ Clinical Pharmacy Specialist

## 2015-06-25 DIAGNOSIS — I482 Chronic atrial fibrillation, unspecified: Secondary | ICD-10-CM

## 2015-06-25 DIAGNOSIS — Z7901 Long term (current) use of anticoagulants: Secondary | ICD-10-CM

## 2015-07-02 LAB — PROTIME-INR: INR: 1.5 — AB (ref 0.9–1.1)

## 2015-07-07 ENCOUNTER — Ambulatory Visit (INDEPENDENT_AMBULATORY_CARE_PROVIDER_SITE_OTHER): Payer: Medicare PPO | Admitting: Pharmacist Clinician (PhC)/ Clinical Pharmacy Specialist

## 2015-07-07 DIAGNOSIS — I482 Chronic atrial fibrillation, unspecified: Secondary | ICD-10-CM

## 2015-07-07 DIAGNOSIS — Z7901 Long term (current) use of anticoagulants: Secondary | ICD-10-CM

## 2015-07-09 LAB — POCT INR: INR: 2.86

## 2015-07-13 ENCOUNTER — Ambulatory Visit (INDEPENDENT_AMBULATORY_CARE_PROVIDER_SITE_OTHER): Payer: Medicare PPO | Admitting: Pharmacist Clinician (PhC)/ Clinical Pharmacy Specialist

## 2015-07-13 DIAGNOSIS — I482 Chronic atrial fibrillation, unspecified: Secondary | ICD-10-CM

## 2015-07-13 DIAGNOSIS — Z7901 Long term (current) use of anticoagulants: Secondary | ICD-10-CM

## 2015-07-16 LAB — PROTIME-INR: INR: 2.8 — AB (ref 0.9–1.1)

## 2015-07-21 ENCOUNTER — Encounter: Payer: Self-pay | Admitting: Vascular Surgery

## 2015-07-22 ENCOUNTER — Ambulatory Visit (INDEPENDENT_AMBULATORY_CARE_PROVIDER_SITE_OTHER): Payer: Medicare PPO | Admitting: Pharmacist Clinician (PhC)/ Clinical Pharmacy Specialist

## 2015-07-22 ENCOUNTER — Ambulatory Visit: Payer: Medicare PPO | Admitting: Vascular Surgery

## 2015-07-22 ENCOUNTER — Encounter (HOSPITAL_COMMUNITY): Payer: Medicare PPO

## 2015-07-22 DIAGNOSIS — I482 Chronic atrial fibrillation, unspecified: Secondary | ICD-10-CM

## 2015-07-22 DIAGNOSIS — Z7901 Long term (current) use of anticoagulants: Secondary | ICD-10-CM

## 2015-07-30 LAB — PROTIME-INR: INR: 2.8 — AB (ref 0.9–1.1)

## 2015-08-03 ENCOUNTER — Ambulatory Visit (INDEPENDENT_AMBULATORY_CARE_PROVIDER_SITE_OTHER): Payer: Medicare PPO | Admitting: Pharmacist Clinician (PhC)/ Clinical Pharmacy Specialist

## 2015-08-03 DIAGNOSIS — Z7901 Long term (current) use of anticoagulants: Secondary | ICD-10-CM

## 2015-08-03 DIAGNOSIS — I482 Chronic atrial fibrillation, unspecified: Secondary | ICD-10-CM

## 2015-08-06 LAB — PROTIME-INR: INR: 2.4 — AB (ref 0.9–1.1)

## 2015-08-10 ENCOUNTER — Ambulatory Visit (INDEPENDENT_AMBULATORY_CARE_PROVIDER_SITE_OTHER): Payer: Medicare PPO | Admitting: Pharmacist Clinician (PhC)/ Clinical Pharmacy Specialist

## 2015-08-10 DIAGNOSIS — I482 Chronic atrial fibrillation, unspecified: Secondary | ICD-10-CM

## 2015-08-10 DIAGNOSIS — Z7901 Long term (current) use of anticoagulants: Secondary | ICD-10-CM

## 2015-08-15 ENCOUNTER — Other Ambulatory Visit: Payer: Self-pay | Admitting: Cardiovascular Disease

## 2015-08-18 ENCOUNTER — Emergency Department (HOSPITAL_COMMUNITY): Payer: Medicare PPO

## 2015-08-18 ENCOUNTER — Telehealth: Payer: Self-pay | Admitting: Cardiovascular Disease

## 2015-08-18 ENCOUNTER — Inpatient Hospital Stay (HOSPITAL_COMMUNITY)
Admission: EM | Admit: 2015-08-18 | Discharge: 2015-09-15 | DRG: 393 | Disposition: E | Payer: Medicare PPO | Attending: Internal Medicine | Admitting: Internal Medicine

## 2015-08-18 ENCOUNTER — Encounter (HOSPITAL_COMMUNITY): Payer: Self-pay

## 2015-08-18 DIAGNOSIS — E785 Hyperlipidemia, unspecified: Secondary | ICD-10-CM | POA: Diagnosis present

## 2015-08-18 DIAGNOSIS — I132 Hypertensive heart and chronic kidney disease with heart failure and with stage 5 chronic kidney disease, or end stage renal disease: Secondary | ICD-10-CM | POA: Diagnosis present

## 2015-08-18 DIAGNOSIS — R0602 Shortness of breath: Secondary | ICD-10-CM

## 2015-08-18 DIAGNOSIS — G9340 Encephalopathy, unspecified: Secondary | ICD-10-CM

## 2015-08-18 DIAGNOSIS — E1151 Type 2 diabetes mellitus with diabetic peripheral angiopathy without gangrene: Secondary | ICD-10-CM | POA: Diagnosis present

## 2015-08-18 DIAGNOSIS — N185 Chronic kidney disease, stage 5: Secondary | ICD-10-CM | POA: Diagnosis not present

## 2015-08-18 DIAGNOSIS — M810 Age-related osteoporosis without current pathological fracture: Secondary | ICD-10-CM | POA: Diagnosis present

## 2015-08-18 DIAGNOSIS — Z87891 Personal history of nicotine dependence: Secondary | ICD-10-CM

## 2015-08-18 DIAGNOSIS — R579 Shock, unspecified: Secondary | ICD-10-CM | POA: Diagnosis not present

## 2015-08-18 DIAGNOSIS — K6389 Other specified diseases of intestine: Secondary | ICD-10-CM | POA: Diagnosis present

## 2015-08-18 DIAGNOSIS — Z6823 Body mass index (BMI) 23.0-23.9, adult: Secondary | ICD-10-CM | POA: Diagnosis not present

## 2015-08-18 DIAGNOSIS — E2740 Unspecified adrenocortical insufficiency: Secondary | ICD-10-CM | POA: Diagnosis present

## 2015-08-18 DIAGNOSIS — E039 Hypothyroidism, unspecified: Secondary | ICD-10-CM | POA: Diagnosis present

## 2015-08-18 DIAGNOSIS — R509 Fever, unspecified: Secondary | ICD-10-CM

## 2015-08-18 DIAGNOSIS — Z23 Encounter for immunization: Secondary | ICD-10-CM

## 2015-08-18 DIAGNOSIS — I2510 Atherosclerotic heart disease of native coronary artery without angina pectoris: Secondary | ICD-10-CM | POA: Diagnosis present

## 2015-08-18 DIAGNOSIS — R0682 Tachypnea, not elsewhere classified: Secondary | ICD-10-CM | POA: Diagnosis not present

## 2015-08-18 DIAGNOSIS — J811 Chronic pulmonary edema: Secondary | ICD-10-CM

## 2015-08-18 DIAGNOSIS — L8990 Pressure ulcer of unspecified site, unspecified stage: Secondary | ICD-10-CM | POA: Insufficient documentation

## 2015-08-18 DIAGNOSIS — Z794 Long term (current) use of insulin: Secondary | ICD-10-CM

## 2015-08-18 DIAGNOSIS — M79671 Pain in right foot: Secondary | ICD-10-CM | POA: Diagnosis present

## 2015-08-18 DIAGNOSIS — R197 Diarrhea, unspecified: Secondary | ICD-10-CM | POA: Diagnosis not present

## 2015-08-18 DIAGNOSIS — R1031 Right lower quadrant pain: Secondary | ICD-10-CM | POA: Diagnosis present

## 2015-08-18 DIAGNOSIS — N181 Chronic kidney disease, stage 1: Secondary | ICD-10-CM | POA: Diagnosis not present

## 2015-08-18 DIAGNOSIS — I4891 Unspecified atrial fibrillation: Secondary | ICD-10-CM | POA: Diagnosis present

## 2015-08-18 DIAGNOSIS — G9341 Metabolic encephalopathy: Secondary | ICD-10-CM | POA: Diagnosis present

## 2015-08-18 DIAGNOSIS — K559 Vascular disorder of intestine, unspecified: Secondary | ICD-10-CM | POA: Diagnosis present

## 2015-08-18 DIAGNOSIS — D696 Thrombocytopenia, unspecified: Secondary | ICD-10-CM | POA: Diagnosis present

## 2015-08-18 DIAGNOSIS — IMO0001 Reserved for inherently not codable concepts without codable children: Secondary | ICD-10-CM

## 2015-08-18 DIAGNOSIS — I953 Hypotension of hemodialysis: Secondary | ICD-10-CM | POA: Diagnosis present

## 2015-08-18 DIAGNOSIS — I340 Nonrheumatic mitral (valve) insufficiency: Secondary | ICD-10-CM | POA: Diagnosis present

## 2015-08-18 DIAGNOSIS — I272 Other secondary pulmonary hypertension: Secondary | ICD-10-CM | POA: Diagnosis present

## 2015-08-18 DIAGNOSIS — I509 Heart failure, unspecified: Secondary | ICD-10-CM | POA: Diagnosis present

## 2015-08-18 DIAGNOSIS — L89152 Pressure ulcer of sacral region, stage 2: Secondary | ICD-10-CM | POA: Diagnosis not present

## 2015-08-18 DIAGNOSIS — F0390 Unspecified dementia without behavioral disturbance: Secondary | ICD-10-CM | POA: Diagnosis present

## 2015-08-18 DIAGNOSIS — R710 Precipitous drop in hematocrit: Secondary | ICD-10-CM | POA: Diagnosis not present

## 2015-08-18 DIAGNOSIS — J9601 Acute respiratory failure with hypoxia: Secondary | ICD-10-CM | POA: Diagnosis not present

## 2015-08-18 DIAGNOSIS — N183 Chronic kidney disease, stage 3 (moderate): Secondary | ICD-10-CM | POA: Diagnosis not present

## 2015-08-18 DIAGNOSIS — I959 Hypotension, unspecified: Secondary | ICD-10-CM | POA: Insufficient documentation

## 2015-08-18 DIAGNOSIS — E46 Unspecified protein-calorie malnutrition: Secondary | ICD-10-CM | POA: Diagnosis present

## 2015-08-18 DIAGNOSIS — Z66 Do not resuscitate: Secondary | ICD-10-CM | POA: Diagnosis present

## 2015-08-18 DIAGNOSIS — E209 Hypoparathyroidism, unspecified: Secondary | ICD-10-CM | POA: Diagnosis present

## 2015-08-18 DIAGNOSIS — D631 Anemia in chronic kidney disease: Secondary | ICD-10-CM | POA: Diagnosis present

## 2015-08-18 DIAGNOSIS — T68XXXA Hypothermia, initial encounter: Secondary | ICD-10-CM | POA: Diagnosis not present

## 2015-08-18 DIAGNOSIS — E871 Hypo-osmolality and hyponatremia: Secondary | ICD-10-CM | POA: Diagnosis not present

## 2015-08-18 DIAGNOSIS — J90 Pleural effusion, not elsewhere classified: Secondary | ICD-10-CM

## 2015-08-18 DIAGNOSIS — I482 Chronic atrial fibrillation: Secondary | ICD-10-CM | POA: Diagnosis present

## 2015-08-18 DIAGNOSIS — E1122 Type 2 diabetes mellitus with diabetic chronic kidney disease: Secondary | ICD-10-CM | POA: Diagnosis present

## 2015-08-18 DIAGNOSIS — I739 Peripheral vascular disease, unspecified: Secondary | ICD-10-CM | POA: Diagnosis not present

## 2015-08-18 DIAGNOSIS — R52 Pain, unspecified: Secondary | ICD-10-CM

## 2015-08-18 DIAGNOSIS — M898X9 Other specified disorders of bone, unspecified site: Secondary | ICD-10-CM | POA: Diagnosis present

## 2015-08-18 DIAGNOSIS — Z09 Encounter for follow-up examination after completed treatment for conditions other than malignant neoplasm: Secondary | ICD-10-CM

## 2015-08-18 DIAGNOSIS — N189 Chronic kidney disease, unspecified: Secondary | ICD-10-CM

## 2015-08-18 DIAGNOSIS — T45515A Adverse effect of anticoagulants, initial encounter: Secondary | ICD-10-CM | POA: Diagnosis not present

## 2015-08-18 DIAGNOSIS — N186 End stage renal disease: Secondary | ICD-10-CM | POA: Diagnosis present

## 2015-08-18 DIAGNOSIS — R109 Unspecified abdominal pain: Secondary | ICD-10-CM

## 2015-08-18 DIAGNOSIS — I9589 Other hypotension: Secondary | ICD-10-CM | POA: Diagnosis not present

## 2015-08-18 DIAGNOSIS — E119 Type 2 diabetes mellitus without complications: Secondary | ICD-10-CM | POA: Diagnosis not present

## 2015-08-18 DIAGNOSIS — T8241XA Breakdown (mechanical) of vascular dialysis catheter, initial encounter: Secondary | ICD-10-CM

## 2015-08-18 DIAGNOSIS — Y712 Prosthetic and other implants, materials and accessory cardiovascular devices associated with adverse incidents: Secondary | ICD-10-CM | POA: Diagnosis present

## 2015-08-18 DIAGNOSIS — Z951 Presence of aortocoronary bypass graft: Secondary | ICD-10-CM

## 2015-08-18 DIAGNOSIS — Z8673 Personal history of transient ischemic attack (TIA), and cerebral infarction without residual deficits: Secondary | ICD-10-CM

## 2015-08-18 DIAGNOSIS — J9811 Atelectasis: Secondary | ICD-10-CM | POA: Diagnosis not present

## 2015-08-18 DIAGNOSIS — Z7901 Long term (current) use of anticoagulants: Secondary | ICD-10-CM | POA: Diagnosis not present

## 2015-08-18 DIAGNOSIS — Z515 Encounter for palliative care: Secondary | ICD-10-CM | POA: Insufficient documentation

## 2015-08-18 DIAGNOSIS — N2581 Secondary hyperparathyroidism of renal origin: Secondary | ICD-10-CM | POA: Diagnosis present

## 2015-08-18 DIAGNOSIS — E873 Alkalosis: Secondary | ICD-10-CM | POA: Diagnosis not present

## 2015-08-18 DIAGNOSIS — R630 Anorexia: Secondary | ICD-10-CM | POA: Diagnosis present

## 2015-08-18 DIAGNOSIS — Z79899 Other long term (current) drug therapy: Secondary | ICD-10-CM | POA: Diagnosis not present

## 2015-08-18 DIAGNOSIS — A419 Sepsis, unspecified organism: Secondary | ICD-10-CM | POA: Diagnosis not present

## 2015-08-18 DIAGNOSIS — E1165 Type 2 diabetes mellitus with hyperglycemia: Secondary | ICD-10-CM | POA: Diagnosis present

## 2015-08-18 DIAGNOSIS — Z992 Dependence on renal dialysis: Secondary | ICD-10-CM

## 2015-08-18 DIAGNOSIS — R1010 Upper abdominal pain, unspecified: Secondary | ICD-10-CM | POA: Diagnosis not present

## 2015-08-18 DIAGNOSIS — D72829 Elevated white blood cell count, unspecified: Secondary | ICD-10-CM

## 2015-08-18 DIAGNOSIS — I10 Essential (primary) hypertension: Secondary | ICD-10-CM | POA: Diagnosis present

## 2015-08-18 LAB — GLUCOSE, CAPILLARY
Glucose-Capillary: 108 mg/dL — ABNORMAL HIGH (ref 65–99)
Glucose-Capillary: 223 mg/dL — ABNORMAL HIGH (ref 65–99)

## 2015-08-18 LAB — CBC WITH DIFFERENTIAL/PLATELET
BASOS ABS: 0 10*3/uL (ref 0.0–0.1)
BASOS PCT: 0 %
Eosinophils Absolute: 0 10*3/uL (ref 0.0–0.7)
Eosinophils Relative: 0 %
HEMATOCRIT: 29.4 % — AB (ref 36.0–46.0)
Hemoglobin: 8.8 g/dL — ABNORMAL LOW (ref 12.0–15.0)
LYMPHS PCT: 4 %
Lymphs Abs: 0.4 10*3/uL — ABNORMAL LOW (ref 0.7–4.0)
MCH: 29.4 pg (ref 26.0–34.0)
MCHC: 29.9 g/dL — ABNORMAL LOW (ref 30.0–36.0)
MCV: 98.3 fL (ref 78.0–100.0)
Monocytes Absolute: 0.4 10*3/uL (ref 0.1–1.0)
Monocytes Relative: 4 %
NEUTROS ABS: 8.9 10*3/uL — AB (ref 1.7–7.7)
NEUTROS PCT: 92 %
Platelets: 80 10*3/uL — ABNORMAL LOW (ref 150–400)
RBC: 2.99 MIL/uL — AB (ref 3.87–5.11)
RDW: 16.3 % — ABNORMAL HIGH (ref 11.5–15.5)
WBC: 9.7 10*3/uL (ref 4.0–10.5)

## 2015-08-18 LAB — AMMONIA: Ammonia: 26 umol/L (ref 9–35)

## 2015-08-18 LAB — BASIC METABOLIC PANEL
ANION GAP: 17 — AB (ref 5–15)
BUN: 58 mg/dL — ABNORMAL HIGH (ref 6–20)
CALCIUM: 7.8 mg/dL — AB (ref 8.9–10.3)
CO2: 27 mmol/L (ref 22–32)
Chloride: 96 mmol/L — ABNORMAL LOW (ref 101–111)
Creatinine, Ser: 7.23 mg/dL — ABNORMAL HIGH (ref 0.44–1.00)
GFR, EST AFRICAN AMERICAN: 6 mL/min — AB (ref >60)
GFR, EST NON AFRICAN AMERICAN: 5 mL/min — AB (ref >60)
Glucose, Bld: 179 mg/dL — ABNORMAL HIGH (ref 65–99)
POTASSIUM: 4.2 mmol/L (ref 3.5–5.1)
SODIUM: 140 mmol/L (ref 135–145)

## 2015-08-18 LAB — URINALYSIS, ROUTINE W REFLEX MICROSCOPIC
Bilirubin Urine: NEGATIVE
GLUCOSE, UA: NEGATIVE mg/dL
KETONES UR: NEGATIVE mg/dL
LEUKOCYTES UA: NEGATIVE
Nitrite: NEGATIVE
PROTEIN: 100 mg/dL — AB
Specific Gravity, Urine: 1.015 (ref 1.005–1.030)
UROBILINOGEN UA: 1 mg/dL (ref 0.0–1.0)
pH: 8.5 — ABNORMAL HIGH (ref 5.0–8.0)

## 2015-08-18 LAB — LACTIC ACID, PLASMA: Lactic Acid, Venous: 2.3 mmol/L (ref 0.5–2.0)

## 2015-08-18 LAB — HEPATIC FUNCTION PANEL
ALK PHOS: 96 U/L (ref 38–126)
ALT: 22 U/L (ref 14–54)
AST: 61 U/L — ABNORMAL HIGH (ref 15–41)
Albumin: 1.8 g/dL — ABNORMAL LOW (ref 3.5–5.0)
BILIRUBIN DIRECT: 0.3 mg/dL (ref 0.1–0.5)
BILIRUBIN INDIRECT: 0.6 mg/dL (ref 0.3–0.9)
TOTAL PROTEIN: 5.2 g/dL — AB (ref 6.5–8.1)
Total Bilirubin: 0.9 mg/dL (ref 0.3–1.2)

## 2015-08-18 LAB — URINE MICROSCOPIC-ADD ON

## 2015-08-18 LAB — PROCALCITONIN: Procalcitonin: 52.76 ng/mL

## 2015-08-18 LAB — PROTIME-INR
INR: 2.32 — AB (ref 0.00–1.49)
Prothrombin Time: 25.2 seconds — ABNORMAL HIGH (ref 11.6–15.2)

## 2015-08-18 LAB — BRAIN NATRIURETIC PEPTIDE: B NATRIURETIC PEPTIDE 5: 1556 pg/mL — AB (ref 0.0–100.0)

## 2015-08-18 LAB — I-STAT TROPONIN, ED: Troponin i, poc: 0.03 ng/mL (ref 0.00–0.08)

## 2015-08-18 LAB — LACTATE DEHYDROGENASE: LDH: 356 U/L — ABNORMAL HIGH (ref 98–192)

## 2015-08-18 LAB — TSH: TSH: 16.864 u[IU]/mL — AB (ref 0.350–4.500)

## 2015-08-18 LAB — LIPASE, BLOOD: Lipase: 17 U/L — ABNORMAL LOW (ref 22–51)

## 2015-08-18 MED ORDER — IOHEXOL 300 MG/ML  SOLN
25.0000 mL | INTRAMUSCULAR | Status: AC
Start: 2015-08-18 — End: 2015-08-19
  Administered 2015-08-18 – 2015-08-19 (×2): 25 mL via ORAL

## 2015-08-18 MED ORDER — WARFARIN - PHARMACIST DOSING INPATIENT: Freq: Every day | Status: DC

## 2015-08-18 MED ORDER — DILTIAZEM HCL 25 MG/5ML IV SOLN
10.0000 mg | Freq: Once | INTRAVENOUS | Status: AC
  Administered 2015-08-18: 10 mg via INTRAVENOUS
  Filled 2015-08-18: qty 5

## 2015-08-18 MED ORDER — METOPROLOL SUCCINATE ER 25 MG PO TB24
25.0000 mg | ORAL_TABLET | Freq: Every day | ORAL | Status: DC
  Administered 2015-08-19 – 2015-08-21 (×3): 25 mg via ORAL
  Filled 2015-08-18 (×3): qty 1

## 2015-08-18 MED ORDER — DILTIAZEM LOAD VIA INFUSION
15.0000 mg | Freq: Once | INTRAVENOUS | Status: AC
  Administered 2015-08-18: 15 mg via INTRAVENOUS
  Filled 2015-08-18: qty 15

## 2015-08-18 MED ORDER — ONDANSETRON HCL 4 MG/2ML IJ SOLN
4.0000 mg | Freq: Four times a day (QID) | INTRAMUSCULAR | Status: DC | PRN
  Filled 2015-08-18: qty 2

## 2015-08-18 MED ORDER — RENA-VITE PO TABS
1.0000 | ORAL_TABLET | Freq: Every day | ORAL | Status: DC
  Administered 2015-08-18 – 2015-09-04 (×18): 1 via ORAL
  Filled 2015-08-18 (×20): qty 1

## 2015-08-18 MED ORDER — SODIUM CHLORIDE 0.9 % IV SOLN
125.0000 mg | INTRAVENOUS | Status: AC
  Administered 2015-08-18 – 2015-08-22 (×3): 125 mg via INTRAVENOUS
  Filled 2015-08-18 (×4): qty 10

## 2015-08-18 MED ORDER — SODIUM CHLORIDE 0.9 % IJ SOLN: 3.0000 mL | INTRAMUSCULAR | Status: DC | PRN

## 2015-08-18 MED ORDER — ALTEPLASE 2 MG IJ SOLR
2.0000 mg | Freq: Once | INTRAMUSCULAR | Status: DC | PRN
  Filled 2015-08-18: qty 2

## 2015-08-18 MED ORDER — LEVOTHYROXINE SODIUM 125 MCG PO TABS
250.0000 ug | ORAL_TABLET | Freq: Every day | ORAL | Status: DC
  Administered 2015-08-19 – 2015-09-05 (×17): 250 ug via ORAL
  Filled 2015-08-18: qty 2
  Filled 2015-08-18: qty 1
  Filled 2015-08-18 (×4): qty 2
  Filled 2015-08-18 (×2): qty 1
  Filled 2015-08-18 (×2): qty 2
  Filled 2015-08-18: qty 1
  Filled 2015-08-18 (×3): qty 2
  Filled 2015-08-18 (×3): qty 1
  Filled 2015-08-18: qty 2
  Filled 2015-08-18 (×2): qty 1
  Filled 2015-08-18 (×7): qty 2
  Filled 2015-08-18 (×3): qty 1
  Filled 2015-08-18: qty 2
  Filled 2015-08-18: qty 1

## 2015-08-18 MED ORDER — DOXERCALCIFEROL 4 MCG/2ML IV SOLN
2.0000 ug | INTRAVENOUS | Status: DC
  Administered 2015-08-18 – 2015-08-20 (×2): 2 ug via INTRAVENOUS
  Filled 2015-08-18 (×3): qty 2

## 2015-08-18 MED ORDER — SODIUM CHLORIDE 0.9 % IV SOLN: 100.0000 mL | INTRAVENOUS | Status: DC | PRN

## 2015-08-18 MED ORDER — MORPHINE SULFATE (PF) 2 MG/ML IV SOLN
1.0000 mg | INTRAVENOUS | Status: DC | PRN
  Administered 2015-08-20: 1 mg via INTRAVENOUS
  Filled 2015-08-18: qty 1

## 2015-08-18 MED ORDER — SODIUM CHLORIDE 0.9 % IV SOLN: 250.0000 mL | INTRAVENOUS | Status: DC | PRN

## 2015-08-18 MED ORDER — PENTAFLUOROPROP-TETRAFLUOROETH EX AERO: 1.0000 "application " | INHALATION_SPRAY | CUTANEOUS | Status: DC | PRN

## 2015-08-18 MED ORDER — SODIUM CHLORIDE 0.9 % IJ SOLN
3.0000 mL | Freq: Two times a day (BID) | INTRAMUSCULAR | Status: DC
  Administered 2015-08-20 – 2015-09-09 (×31): 3 mL via INTRAVENOUS

## 2015-08-18 MED ORDER — ACETAMINOPHEN 650 MG RE SUPP: 650.0000 mg | Freq: Four times a day (QID) | RECTAL | Status: DC | PRN

## 2015-08-18 MED ORDER — DOXERCALCIFEROL 4 MCG/2ML IV SOLN
INTRAVENOUS | Status: AC
  Filled 2015-08-18: qty 2

## 2015-08-18 MED ORDER — DARBEPOETIN ALFA 150 MCG/0.3ML IJ SOSY
PREFILLED_SYRINGE | INTRAMUSCULAR | Status: AC
  Filled 2015-08-18: qty 0.3

## 2015-08-18 MED ORDER — CALCIUM CARBONATE 1250 (500 CA) MG PO TABS
1500.0000 mg | ORAL_TABLET | Freq: Every day | ORAL | Status: DC
  Filled 2015-08-18 (×2): qty 1

## 2015-08-18 MED ORDER — DARBEPOETIN ALFA 150 MCG/0.3ML IJ SOSY
150.0000 ug | PREFILLED_SYRINGE | INTRAMUSCULAR | Status: DC
  Administered 2015-08-18 – 2015-09-01 (×2): 150 ug via INTRAVENOUS
  Filled 2015-08-18 (×3): qty 0.3

## 2015-08-18 MED ORDER — PRAVASTATIN SODIUM 40 MG PO TABS
40.0000 mg | ORAL_TABLET | Freq: Every day | ORAL | Status: DC
  Administered 2015-08-18 – 2015-09-05 (×17): 40 mg via ORAL
  Filled 2015-08-18 (×17): qty 1

## 2015-08-18 MED ORDER — ONDANSETRON HCL 4 MG PO TABS: 4.0000 mg | ORAL_TABLET | Freq: Four times a day (QID) | ORAL | Status: DC | PRN

## 2015-08-18 MED ORDER — CALCIUM ACETATE 667 MG PO CAPS: 1334.0000 mg | ORAL_CAPSULE | Freq: Three times a day (TID) | ORAL | Status: DC

## 2015-08-18 MED ORDER — INSULIN ASPART 100 UNIT/ML ~~LOC~~ SOLN
0.0000 [IU] | SUBCUTANEOUS | Status: DC
Start: 2015-08-18 — End: 2015-08-20
  Administered 2015-08-18: 3 [IU] via SUBCUTANEOUS
  Administered 2015-08-19 (×2): 2 [IU] via SUBCUTANEOUS
  Administered 2015-08-19: 1 [IU] via SUBCUTANEOUS
  Administered 2015-08-20: 5 [IU] via SUBCUTANEOUS
  Administered 2015-08-20: 2 [IU] via SUBCUTANEOUS

## 2015-08-18 MED ORDER — PNEUMOCOCCAL VAC POLYVALENT 25 MCG/0.5ML IJ INJ
0.5000 mL | INJECTION | INTRAMUSCULAR | Status: DC
  Filled 2015-08-18: qty 0.5

## 2015-08-18 MED ORDER — ACETAMINOPHEN 325 MG PO TABS
650.0000 mg | ORAL_TABLET | Freq: Four times a day (QID) | ORAL | Status: DC | PRN
  Administered 2015-08-19 – 2015-08-21 (×2): 650 mg via ORAL
  Filled 2015-08-18 (×3): qty 2

## 2015-08-18 MED ORDER — WARFARIN SODIUM 2.5 MG PO TABS
4.5000 mg | ORAL_TABLET | Freq: Once | ORAL | Status: AC
  Administered 2015-08-18: 4.5 mg via ORAL
  Filled 2015-08-18 (×3): qty 1

## 2015-08-18 MED ORDER — LIDOCAINE HCL (PF) 1 % IJ SOLN: 5.0000 mL | INTRAMUSCULAR | Status: DC | PRN

## 2015-08-18 MED ORDER — INFLUENZA VAC SPLIT QUAD 0.5 ML IM SUSY
0.5000 mL | PREFILLED_SYRINGE | INTRAMUSCULAR | Status: AC
  Administered 2015-08-19: 0.5 mL via INTRAMUSCULAR
  Filled 2015-08-18: qty 0.5

## 2015-08-18 MED ORDER — HEPARIN SODIUM (PORCINE) 1000 UNIT/ML DIALYSIS: 1000.0000 [IU] | INTRAMUSCULAR | Status: DC | PRN

## 2015-08-18 MED ORDER — LIDOCAINE-PRILOCAINE 2.5-2.5 % EX CREA
1.0000 "application " | TOPICAL_CREAM | CUTANEOUS | Status: DC | PRN
  Filled 2015-08-18: qty 5

## 2015-08-18 MED ORDER — SODIUM CHLORIDE 0.9 % IJ SOLN: 3.0000 mL | Freq: Two times a day (BID) | INTRAMUSCULAR | Status: DC

## 2015-08-18 MED ORDER — SODIUM CHLORIDE 0.9 % IV BOLUS (SEPSIS)
250.0000 mL | Freq: Once | INTRAVENOUS | Status: AC
  Administered 2015-08-18: 250 mL via INTRAVENOUS

## 2015-08-18 MED ORDER — LEVOTHYROXINE SODIUM 50 MCG PO TABS: 50.0000 ug | ORAL_TABLET | Freq: Every day | ORAL | Status: DC

## 2015-08-18 MED ORDER — DEXTROSE 5 % IV SOLN
5.0000 mg/h | INTRAVENOUS | Status: DC
  Administered 2015-08-18 (×2): 5 mg/h via INTRAVENOUS
  Filled 2015-08-18 (×4): qty 100

## 2015-08-18 MED ORDER — CALCIUM ACETATE (PHOS BINDER) 667 MG PO CAPS
1334.0000 mg | ORAL_CAPSULE | Freq: Three times a day (TID) | ORAL | Status: DC
  Administered 2015-08-19 – 2015-08-21 (×8): 1334 mg via ORAL
  Filled 2015-08-18 (×9): qty 2

## 2015-08-18 MED ORDER — LEVOTHYROXINE SODIUM 100 MCG PO TABS: 200.0000 ug | ORAL_TABLET | Freq: Every day | ORAL | Status: DC

## 2015-08-18 NOTE — Progress Notes (Signed)
ANTICOAGULATION CONSULT NOTE - Initial Consult  Pharmacy Consult for warfarin Indication: atrial fibrillation  No Known Allergies  Patient Measurements:    Vital Signs: Temp: 98.8 F (37.1 C) (10/04 1059) Temp Source: Oral (10/04 1059) BP: 107/58 mmHg (10/04 1430) Pulse Rate: 120 (10/04 1430)  Labs:  Recent Labs  08-29-15 1120  HGB 8.8*  HCT 29.4*  PLT 80*  LABPROT 25.2*  INR 2.32*  CREATININE 7.23*    CrCl cannot be calculated (Unknown ideal weight.).   Medical History: Past Medical History  Diagnosis Date  . Diabetes mellitus without complication (HCC)   . Hypertension   . Hyperlipidemia   . Atrial fibrillation (HCC)   . ESRD (end stage renal disease) (HCC)     HD since 06/2000  . CAD (coronary artery disease)   . 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 History of nuclear stress test 10/2012    lexiscan; normal stress test; post-stress EF72%  . Peripheral arterial disease (HCC)   . Osteoporosis   . Mitral regurgitation     severe by 2-D echocardiogram performed 10/22/14    Medications:   (Not in a hospital admission)  Assessment: 40 yoF w/ ESRD on HD presenting with 2d history of lethargy, decreased appetite and AMS. To continue warfarin in house for afib. Spoke to patient who does not report any signs of bleeding.   Home warfarin dose = 6 mg on Mondays, 4.5 mg all other days.  Hgb 8.8, Plt 80, INR 2.32  Goal of Therapy:  INR 2-3 Monitor platelets by anticoagulation protocol: Yes   Plan:  Warfarin 4.5 mg po x1 Monitor platelets closely Daily INR, CBC q72h  Arcola Jansky, PharmD Clinical Pharmacy Resident Pager: [phone] 08-29-2015,2:43 PM

## 2015-08-18 NOTE — Consult Note (Addendum)
Indication for Consultation:  Management of ESRD/hemodialysis; anemia, hypertension/volume and secondary hyperparathyroidism  HPI: Megan Vasquez is a 74 y.o. female who presented to the ED this AM for evaluation of weakness and altered mental status. She receives [facility] @ [facility], history of HTN, DM, afib and CAD. Last HD on Saturday without any complication. Per her family, her grandson noticed worsening confusion since last night, she was weak and not eating. She was brought to the ED for evaluation. In the ED pt in afib, tachycardic up to the 150s, she was started on diltiazem drip. Head CT no acute findings. She will be admitted for further evaluation, will arrange for HD.   Past Medical History  Diagnosis Date  . Diabetes mellitus without complication (HCC)   . Hypertension   . Hyperlipidemia   . Atrial fibrillation (HCC)   . ESRD (end stage renal disease) (HCC)     HD since 06/2000  . CAD (coronary artery disease)   . 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 History of nuclear stress test 10/2012    lexiscan; normal stress test; post-stress EF72%  . Peripheral arterial disease (HCC)   . Osteoporosis   . Mitral regurgitation     severe by 2-D echocardiogram performed 10/22/14   Past Surgical History  Procedure Laterality Date  . Av fistula placement  2007    L brachial cephalic   . Transthoracic echocardiogram  12/2010    EF=>55%, mod conc LVH; mild mitral annular calcif; mild MR; trace TR; trace PV regurg  . Lower extremity arterial doppler  10/02/2013    bilat ABIs could not be obtained (occluded vessels); bilat TBIs - no flow visualized; R mid SFA 70-99% diameter reduction; bilat runoff with posterior tibial, anterior tibial, peroneal arteries with occlusive disease   . Cardiac catheterization  12/19/2005    3 vessel disease with normal LV function,  positive Cardiolite --> subsequent CABG (Dr. [name])  . Coronary artery bypass graft  01/18/2006    LIMA to LAD, vein to diagonal, vein to marginal branch of Cfx, vein to distal RCA (Dr. Wayland Salinas)  . Carotid doppler  02/24/2009    R bulb 0-49% diameter refuction, R & L ICAs with significatn tortuosity (source of bruit?)   . Abdominal aortagram N/A 03/24/2014    Procedure: ABDOMINAL Ronny Flurry;  Surgeon: Chuck Hint, MD;  Location: [facility] CATH LAB;  Service: Cardiovascular;  Laterality: N/A;   Family History  Problem Relation Age of Onset  . Heart disease Mother   . Heart attack Brother   . Breast cancer Sister   . Diabetes Sister   . Heart attack Sister   . Diabetes Child    Social History:  reports that she quit smoking about 6 years ago. She has never used smokeless tobacco. She reports that she does not drink alcohol or use illicit drugs. No Known Allergies Prior to Admission medications   Medication Sig Start Date End Date Taking? Authorizing Provider  calcium acetate (PHOSLO) 667 MG capsule Take 1,334 mg by mouth 3 (three) times daily with meals.  12/21/13  Yes Historical Provider, MD  diltiazem (CARDIZEM CD) 240 MG 24 hr capsule TAKE ONE CAPSULE BY MOUTH DAILY 05/19/15  Yes Runell Gess, MD  insulin glargine (LANTUS) 100 UNIT/ML injection Inject 10 Units into the skin 2 (two) times daily.  10 units twice a day   Yes Historical Provider, MD  levothyroxine (SYNTHROID, LEVOTHROID) 200 MCG tablet Take 200 mcg by mouth daily before breakfast. Take with to equal total dose of   Yes Historical Provider, MD  levothyroxine (SYNTHROID, LEVOTHROID) 50 MCG tablet Take 50 mcg by mouth daily. Take  With to equal 10/14/14  Yes Historical Provider, MD  metoprolol succinate (TOPROL-XL) 50 MG 24 hr tablet Take 1/2 tablet ( ) by mouth daily. Patient taking differently: Take 25 mg by mouth daily.  11/17/14  Yes Runell Gess, MD  pravastatin (PRAVACHOL) 40 MG tablet  Take 40 mg by mouth every morning.    Yes Historical Provider, MD  warfarin (COUMADIN) 3 MG tablet Take 4.5-6 mg by mouth daily. Take 2 tablets ( ) on Monday, and 1 1/2 (4.5MG ) tablet all other days   Yes Historical Provider, MD  acetaminophen (TYLENOL) 325 MG tablet Take 325 mg by mouth every 6 (six) hours as needed for moderate pain.    Historical Provider, MD  Calcium Carbonate (CALCIUM 600 PO) Take 1 tablet by mouth 3 (three) times daily.     Historical Provider, MD  diltiazem (CARDIZEM CD) 240 MG 24 hr capsule Take 240 mg by mouth daily.    Historical Provider, MD  lidocaine-prilocaine (EMLA) cream Apply 1 application topically Every Tuesday,Thursday,and Saturday with dialysis.    Historical Provider, MD  metoprolol succinate (TOPROL-XL) 50 MG 24 hr tablet Take 25 mg by mouth daily. Take with or immediately following a meal.    Historical Provider, MD  OVER THE COUNTER MEDICATION Apply 1 application topically Every Tuesday,Thursday,and Saturday with dialysis. Diabetic foot cream used for HD    Historical Provider, MD  warfarin (COUMADIN) 3 MG tablet Take 1.5 to 2 tablets by mouth daily as directed by coumadin clinic Patient not taking: Reported on August 24, 2015 01/05/15   Lennette Bihari, MD  warfarin (COUMADIN) 3 MG tablet TAKE 1.5 TO 2 TABLETS BY MOUTH DAILY AS DIRECTED BY COUMADIN CLINIC Patient not taking: Reported on 2015-08-24 08/17/15   Runell Gess, MD   Current Facility-Administered Medications  Medication Dose Route Frequency Provider Last Rate Last Dose  . diltiazem (CARDIZEM) 100 mg in dextrose 5 % 100 mL (1 mg/mL) infusion  5-15 mg/hr Intravenous Continuous Theda Belfast, MD 5 mL/hr at August 24, 2015 1305 5 mg/hr at 08/24/15 1305   Current Outpatient Prescriptions  Medication Sig Dispense Refill  . calcium acetate (PHOSLO) 667 MG capsule Take 1,334 mg by mouth 3 (three) times daily with meals.     Marland Kitchen diltiazem (CARDIZEM CD) 240 MG 24 hr capsule TAKE ONE CAPSULE BY MOUTH DAILY 30 capsule  6  . insulin glargine (LANTUS) 100 UNIT/ML injection Inject 10 Units into the skin 2 (two) times daily. 10 units twice a day    . levothyroxine (SYNTHROID, LEVOTHROID) 200 MCG tablet Take 200 mcg by mouth daily before breakfast. Take with to equal total dose of    . levothyroxine (SYNTHROID, LEVOTHROID) 50 MCG tablet Take 50 mcg by mouth daily. Take  With to equal    . metoprolol succinate (TOPROL-XL) 50 MG 24 hr tablet Take 1/2 tablet ( ) by mouth daily. (Patient taking differently: Take 25 mg by mouth daily. ) 30 tablet 10  . pravastatin (PRAVACHOL) 40 MG tablet Take 40 mg by mouth every morning.     . warfarin (COUMADIN) 3 MG tablet Take 4.5-6 mg by mouth daily. Take 2 tablets ( ) on Monday,  and 1 1/2 (4.5MG ) tablet all other days    . acetaminophen (TYLENOL) 325 MG tablet Take 325 mg by mouth every 6 (six) hours as needed for moderate pain.    . Calcium Carbonate (CALCIUM 600 PO) Take 1 tablet by mouth 3 (three) times daily.     Marland Kitchen diltiazem (CARDIZEM CD) 240 MG 24 hr capsule Take 240 mg by mouth daily.    Marland Kitchen lidocaine-prilocaine (EMLA) cream Apply 1 application topically Every Tuesday,Thursday,and Saturday with dialysis.    Marland Kitchen metoprolol succinate (TOPROL-XL) 50 MG 24 hr tablet Take 25 mg by mouth daily. Take with or immediately following a meal.    . OVER THE COUNTER MEDICATION Apply 1 application topically Every Tuesday,Thursday,and Saturday with dialysis. Diabetic foot cream used for HD    . warfarin (COUMADIN) 3 MG tablet Take 1.5 to 2 tablets by mouth daily as directed by coumadin clinic (Patient not taking: Reported on Sep 10, 2015) 150 tablet 1  . warfarin (COUMADIN) 3 MG tablet TAKE 1.5 TO 2 TABLETS BY MOUTH DAILY AS DIRECTED BY COUMADIN CLINIC (Patient not taking: Reported on 09-10-2015) 50 tablet 3   Labs: Basic Metabolic Panel:  Recent Labs Lab 2015/09/10 1120  NA 140  K 4.2  CL 96*  CO2 27  GLUCOSE 179*  BUN 58*  CREATININE 7.23*  CALCIUM 7.8*    Liver Function Tests: No results for input(s): AST, ALT, ALKPHOS, BILITOT, PROT, ALBUMIN in the last 168 hours. No results for input(s): LIPASE, AMYLASE in the last 168 hours. No results for input(s): AMMONIA in the last 168 hours. CBC:  Recent Labs Lab Sep 10, 2015 1120  WBC 9.7  NEUTROABS 8.9*  HGB 8.8*  HCT 29.4*  MCV 98.3  PLT 80*   Cardiac Enzymes: No results for input(s): CKTOTAL, CKMB, CKMBINDEX, TROPONINI in the last 168 hours. CBG: No results for input(s): GLUCAP in the last 168 hours. Iron Studies: No results for input(s): IRON, TIBC, TRANSFERRIN, FERRITIN in the last 72 hours. Studies/Results: Ct Head Wo Contrast  2015-09-10   CLINICAL DATA:  Altered mental status, generalized weakness for 2 days.  EXAM: CT HEAD WITHOUT CONTRAST  TECHNIQUE: Contiguous axial images were obtained from the base of the skull through the vertex without intravenous contrast.  COMPARISON:  01/24/2006  FINDINGS: Old left occipital infarct. No acute intracranial abnormality. Specifically, no hemorrhage, hydrocephalus, mass lesion, acute infarction, or significant intracranial injury. No acute calvarial abnormality. Visualized paranasal sinuses and mastoids clear. Orbital soft tissues unremarkable.  IMPRESSION: No acute intracranial abnormality.  Old left occipital infarct.   Electronically Signed   By: Charlett Nose M.D.   On: September 10, 2015 13:00   Dg Chest Portable 1 View  09/10/2015   CLINICAL DATA:  Tachycardia.  EXAM: PORTABLE CHEST 1 VIEW  COMPARISON:  07/22/2014  FINDINGS: Prior median sternotomy and CABG. Cardiomegaly. Mild vascular congestion. No overt edema. No effusions. No acute bony abnormality.  IMPRESSION: Cardiomegaly with vascular congestion.  No overt failure.   Electronically Signed   By: Charlett Nose M.D.   On: 2015/09/10 11:48    Review of Systems: Pt has no complaints, just thirsty.  Denies chest pain and sob.  Daughter in law reports pt complained of abd pain on Saturday.- denies  nausea, constipation and diarrhea.  Denies bleeding/blood in stool Daughter in law says that pts BP and HR are 'eratic' at baseline   Physical Exam: Filed Vitals:   09-10-15 1059 10-Sep-2015 1100 September 10, 2015 1315 10-Sep-2015 1415  BP: 117/71 117/71 104/48 101/59  Pulse: 137  71 117  Temp: 98.8 F (37.1 C)     TempSrc: Oral     Resp: 32 35 29 24  SpO2: 94%  96% 99%     General: Well developed, well nourished, in no acute distress. Head: Normocephalic, atraumatic, sclera non-icteric, mucus membranes are moist Neck: Supple. JVD not elevated. Lungs: Clear bilaterally- anterior only Breathing is unlabored. Heart: Tachy, irregular Abdomen: Soft, mild RLQ tenderness, non-distended with normoactive bowel sounds. No rebound/guarding. No obvious abdominal masses. M-S:  Strength and tone appear normal for age. Lower extremities:without edema or ischemic changes, no open wounds  Neuro: Alert and oriented X 3. Moves all extremities spontaneously. Psych:  Responds to questions appropriately with a normal affect. Dialysis Access: L AVf +b/t   CXR no edema or infiltrates  Dialysis Orders: TTS [facility] 4 hrs   65.5kgs   2K/2Ca     L AVF   1000u hep hectorol 2 venofor 100 q HD until 10/8 then 50 q week Micera 100 q2 weeks - to start 10/6  Assessment/Plan: 1.  afib RVR / CAF- on coumadin and diltiazem at home- diltiazem drip started. INR 2.32 2. AMS- Head CT  No acute process.  3.  ESRD -  TTS [facility]. HD pending today.  4.  Hypertension/volume  - hypotension may limit volume removal while on diltiazem drip/ Vasc congestion on xray 5.  Anemia  - hgb 8.8. - start micera today. Cont Fe bolus. hgb down from 10.5 on 9/22- 8.1 on 9/29- check stool 6.  Metabolic bone disease - last PTH 207 Cont hectorol. Phos 2.8- no binders 7.  Nutrition - will need renal diet.  8. DM 9. Hypothyroid- TSH 16- was 17 in July and 19 in June- per outpt labs 10. CAD hx CABG  Jetty Duhamel, NP [facility]  [phone] 09/13/2015, 2:29 PM   Pt seen, examined, agree w assess/plan as above with additions as indicated. ESRD patient w AMS and afib with RVR. Hx of CAF on coumadin. No psychotropic meds listed, no opiates or other sedating medications.  Has some abd pain, still makes urine. UA unremarkable.  UCx and blood cx's sent. No fever or ^wbc. Uncertain cause of confusion. Plan HD today, minimal UF.  Vinson Moselle MD pager [phone]    cell [phone] 09/04/2015, 5:58 PM

## 2015-08-18 NOTE — ED Provider Notes (Signed)
CSN: [id number]     Arrival date & time 09/02/2015  1051 History   First MD Initiated Contact with Patient 09/01/2015 1059     Chief Complaint  Patient presents with  . Weakness   Megan Vasquez is a 74 y.o. female with a past medical history significant for diabetes, hypertension, hyperlipidemia, end-stage renal disease on dialysis, coronary artery disease status post CABG, or stroke and atrial fibrillation on Coumadin therapy who presents with a 2 day history of lethargy, decrease in appetite, and altered mental status. The patient's come in by family report that the patient has "not been herself" for the last 2 days. They deny any specific trauma that the patient has sustained and report that the patient has otherwise not had any complaint. The patient family says that this morning, the grandson noticed she had worsening confusion and was unable to answer simple questions. They called 911 for transportation to the ED.  On arrival, the patient is denying headache, vision changes, nausea, vomiting, constipation, diarrhea, dysuria and small amount urine the patient does make, or any focal neurological complaints. The patient denies any chest pain, shortness of breath, or palpitations. The patient is tachycardic in the 130s to 150s on arrival. The patient denies taking any new medications and also denies running out of any recent medications.   (Consider location/radiation/quality/duration/timing/severity/associated sxs/prior Treatment) Patient is a 74 y.o. female presenting with altered mental status. The history is provided by the patient. No language interpreter was used.  Altered Mental Status Presenting symptoms: confusion   Presenting symptoms: no lethargy (sleep here by family report however the patient does not appear lethargic on arrival)   Severity:  Moderate Most recent episode:  2 days ago Episode history:  Single Timing:  Constant Progression:  Waxing and waning Chronicity:   New Context: not dementia, not head injury and not a nursing home resident   Associated symptoms: no abdominal pain, no agitation, no difficulty breathing, no fever, no headaches, no light-headedness, no nausea, no palpitations, no rash, no slurred speech, no vomiting and no weakness     Past Medical History  Diagnosis Date  . Diabetes mellitus without complication (HCC)   . Hypertension   . Hyperlipidemia   . Atrial fibrillation (HCC)   . ESRD (end stage renal disease) (HCC)     HD since 06/2000  . CAD (coronary artery disease)   . 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 History of nuclear stress test 10/2012    lexiscan; normal stress test; post-stress EF72%  . Peripheral arterial disease (HCC)   . Osteoporosis   . Mitral regurgitation     severe by 2-D echocardiogram performed 10/22/14   Past Surgical History  Procedure Laterality Date  . Av fistula placement  2007    L brachial cephalic   . Transthoracic echocardiogram  12/2010    EF=>55%, mod conc LVH; mild mitral annular calcif; mild MR; trace TR; trace PV regurg  . Lower extremity arterial doppler  10/02/2013    bilat ABIs could not be obtained (occluded vessels); bilat TBIs - no flow visualized; R mid SFA 70-99% diameter reduction; bilat runoff with posterior tibial, anterior tibial, peroneal arteries with occlusive disease   . Cardiac catheterization  12/19/2005    3 vessel disease with normal LV function, positive Cardiolite --> subsequent CABG (Dr. [name])  .  Coronary artery bypass graft  01/18/2006    LIMA to LAD, vein to diagonal, vein to marginal branch of Cfx, vein to distal RCA (Dr. Wayland Salinas)  . Carotid doppler  02/24/2009    R bulb 0-49% diameter refuction, R & L ICAs with significatn tortuosity (source of bruit?)   . Abdominal aortagram N/A 03/24/2014    Procedure: ABDOMINAL Ronny Flurry;   Surgeon: Chuck Hint, MD;  Location: [facility] CATH LAB;  Service: Cardiovascular;  Laterality: N/A;   Family History  Problem Relation Age of Onset  . Heart disease Mother   . Heart attack Brother   . Breast cancer Sister   . Diabetes Sister   . Heart attack Sister   . Diabetes Child    Social History  Substance Use Topics  . Smoking status: Former Smoker    Quit date: 06/10/2009  . Smokeless tobacco: Never Used  . Alcohol Use: No   OB History    No data available     Review of Systems  Constitutional: Negative for fever, chills, diaphoresis and fatigue.  HENT: Negative for congestion and rhinorrhea.   Eyes: Negative for visual disturbance.  Respiratory: Negative for cough, chest tightness, shortness of breath and stridor.   Cardiovascular: Negative for chest pain, palpitations and leg swelling.  Gastrointestinal: Negative for nausea, vomiting, abdominal pain, diarrhea and constipation.  Genitourinary: Negative for flank pain.  Musculoskeletal: Negative for back pain, neck pain and neck stiffness.  Skin: Negative for rash and wound.  Neurological: Negative for dizziness, syncope, facial asymmetry, weakness, light-headedness, numbness and headaches.  Psychiatric/Behavioral: Positive for confusion. Negative for agitation.  All other systems reviewed and are negative.     Allergies  Review of patient's allergies indicates no known allergies.  Home Medications   Prior to Admission medications   Medication Sig Start Date End Date Taking? Authorizing Provider  acetaminophen (TYLENOL) 325 MG tablet Take 325 mg by mouth every 6 (six) hours as needed for moderate pain.    Historical Provider, MD  calcium acetate (PHOSLO) 667 MG capsule Take 1,334 mg by mouth 3 (three) times daily with meals.  12/21/13   Historical Provider, MD  Calcium Carbonate (CALCIUM 600 PO) Take 1 tablet by mouth 3 (three) times daily.     Historical Provider, MD  diltiazem (CARDIZEM CD) 240 MG 24 hr  capsule Take 240 mg by mouth daily.    Historical Provider, MD  diltiazem (CARDIZEM CD) 240 MG 24 hr capsule TAKE ONE CAPSULE BY MOUTH DAILY 05/19/15   Runell Gess, MD  insulin glargine (LANTUS) 100 UNIT/ML injection Inject 10 Units into the skin 2 (two) times daily. 10 units twice a day    Historical Provider, MD  levothyroxine (SYNTHROID, LEVOTHROID) 200 MCG tablet Take 200 mcg by mouth daily before breakfast.    Historical Provider, MD  levothyroxine (SYNTHROID, LEVOTHROID) 50 MCG tablet Take 50 mcg by mouth daily. 10/14/14   Historical Provider, MD  lidocaine-prilocaine (EMLA) cream Apply 1 application topically Every Tuesday,Thursday,and Saturday with dialysis.    Historical Provider, MD  metoprolol succinate (TOPROL-XL) 50 MG 24 hr tablet Take 25 mg by mouth daily. Take with or immediately following a meal.    Historical Provider, MD  metoprolol succinate (TOPROL-XL) 50 MG 24 hr tablet Take 1/2 tablet (25mg ) by mouth daily. 11/17/14   Runell Gess, MD  OVER THE COUNTER MEDICATION Apply 1 application topically Every Tuesday,Thursday,and Saturday with dialysis. Diabetic foot cream used for HD    Historical Provider, MD  pravastatin (PRAVACHOL) 40 MG tablet Take 40 mg by mouth every morning.     Historical Provider, MD  warfarin (COUMADIN) 3 MG tablet Take 1.5 to 2 tablets by mouth daily as directed by coumadin clinic 01/05/15   Lennette Bihari, MD  warfarin (COUMADIN) 3 MG tablet TAKE 1.5 TO 2 TABLETS BY MOUTH DAILY AS DIRECTED BY COUMADIN CLINIC 08/17/15   Runell Gess, MD   BP 117/71 mmHg  Pulse 137  Temp(Src) 98.8 F (37.1 C) (Oral)  Resp 32  SpO2 94% Physical Exam  Constitutional: She appears well-developed and well-nourished. No distress.  HENT:  Head: Normocephalic and atraumatic.  Eyes: Conjunctivae and EOM are normal. Pupils are equal, round, and reactive to light.  Neck: Normal range of motion.  Cardiovascular: An irregular rhythm present. Tachycardia present.    Pulmonary/Chest: Effort normal and breath sounds normal. No stridor. No respiratory distress. She has no wheezes. She exhibits no tenderness.  Abdominal: Soft. Bowel sounds are normal. She exhibits no distension and no mass. There is no tenderness. There is no rebound.  Musculoskeletal: She exhibits no tenderness.  Neurological: She is alert. No cranial nerve deficit. She exhibits normal muscle tone.  Skin: Skin is warm. She is not diaphoretic. No pallor.  Psychiatric: She has a normal mood and affect.  Nursing note and vitals reviewed.   ED Course  Procedures (including critical care time) Labs Review Labs Reviewed  BRAIN NATRIURETIC PEPTIDE - Abnormal; Notable for the following:    B Natriuretic Peptide 1556.0 (*)    All other components within normal limits  BASIC METABOLIC PANEL - Abnormal; Notable for the following:    Chloride 96 (*)    Glucose, Bld 179 (*)    BUN 58 (*)    Creatinine, Ser 7.23 (*)    Calcium 7.8 (*)    GFR calc non Af Amer 5 (*)    GFR calc Af Amer 6 (*)    Anion gap 17 (*)    All other components within normal limits  CBC WITH DIFFERENTIAL/PLATELET - Abnormal; Notable for the following:    RBC 2.99 (*)    Hemoglobin 8.8 (*)    HCT 29.4 (*)    MCHC 29.9 (*)    RDW 16.3 (*)    Neutro Abs 8.9 (*)    Lymphs Abs 0.4 (*)    All other components within normal limits  PROTIME-INR - Abnormal; Notable for the following:    Prothrombin Time 25.2 (*)    INR 2.32 (*)    All other components within normal limits  URINE CULTURE  URINALYSIS, ROUTINE W REFLEX MICROSCOPIC (NOT AT [facility])  TSH  I-STAT TROPOININ, ED    Imaging Review Dg Chest Portable 1 View  08/19/2015   CLINICAL DATA:  Tachycardia.  EXAM: PORTABLE CHEST 1 VIEW  COMPARISON:  07/22/2014  FINDINGS: Prior median sternotomy and CABG. Cardiomegaly. Mild vascular congestion. No overt edema. No effusions. No acute bony abnormality.  IMPRESSION: Cardiomegaly with vascular congestion.  No overt failure.    Electronically Signed   By: Charlett Nose M.D.   On: 08/22/2015 11:48   I have personally reviewed and evaluated these images and lab results as part of my medical decision-making.   EKG Interpretation   Date/Time:  Tuesday August 18 2015 11:11:44 EDT Ventricular Rate:  149 PR Interval:    QRS Duration: 77 QT Interval:  306 QTC Calculation: 482 R Axis:   92 Text Interpretation:  Atrial fibrillation with rapid V-rate Right  axis  deviation Non-specific ST-t changes Confirmed by Denton Lank  MD, Caryn Bee ([id number])  on 08/30/2015 11:15:19 AM      MDM   Megan Vasquez is a 74 y.o. female with a past medical history significant for diabetes, hypertension, hyperlipidemia, end-stage renal disease on dialysis, coronary artery disease status post CABG, or stroke and atrial fibrillation on Coumadin therapy who presents with a 2 day history of lethargy, decrease in appetite, and altered mental status. On initial examination, the patient is only alert and oriented 2 to person and place. The patient is also tachycardic in the 130s to 150s. The patient had initial EKG which showed A. fib with RVR.   Given the patient's altered mental status and afib with RVR, possible considerations as to etiology include infection, her arrhythmia, stroke, and fluid overload. On exam, the patient's lungs had small crackles but otherwise did not sound completely fluid overloaded. The patient's neurological exam was unremarkable aside from her confusion. With her history of stroke in the setting of altered mental status, the patient will have a CT head to rule out new stroke.   The patient was given IV diltiazem initially to attempt rate control. The patient continued to be tachycardic and will be given a second IV dose as well as a diltiazem drip. The patient continues to deny any chest pain during her evaluation and workup.  The patient's laboratory testing results are seen above. The patient was found to have an elevated BNP  of 1556, and elevated creatinine of 7.23 with a BUN of 58, no evidence of acute urinary tract infection, and a troponin of 0.03. The patient was anemic at 8.8 however she did not have a leukocytosis. The patient's platelets were decreased at 80 and her INR was 2.32. The patient's TSH was also elevated at 16.8. The patient's CT scan of the head did not show evidence of acute bleed or acute intracranial abnormality. Her chest x-ray showed cardiomegaly with vascular congestion.  Given the patient's vascular congestion on chest x-ray, her her lungs, her elevated BNP, suspect the patient will need dialysis in order to remove fluids. With her thrombocytopenia, anemia, and her various other lab abnormalities, the patient will need admission for further medical management.   The patient is currently on a diltiazem drip for rate control. This has improved the patient's heart rate to around 100 still in atrial fibrillation, however, the patient's blood pressure has slightly decreased to around 100 systolic. The patient is otherwise resting comfortably in her exam room during her workup.  Nephrology will be engaged for likely dialysis and subsequent medicine admission.  The nephrology team requested the patient admitted to the internal medicine service for further management and likely dialysis today. The patient did not have any other problems while in the ED and was admitted in stable condition.  This patient was seen with Dr. Denton Lank, emergency medicine attending.   Final diagnoses:  RLQ abdominal pain        Theda Belfast, MD 09/12/2015 1640  Cathren Laine, MD 08/19/15 1201

## 2015-08-18 NOTE — ED Notes (Signed)
Patient transported to CT 

## 2015-08-18 NOTE — Telephone Encounter (Signed)
Pt's daughter called in wanting to inform Dr. Allyson Sabal that she has been taken to the hospital due to stroke-like symptoms. She just wanted them to know

## 2015-08-18 NOTE — Telephone Encounter (Signed)
Routed to MD as FYI.

## 2015-08-18 NOTE — Progress Notes (Signed)
CRITICAL VALUE ALERT  Critical value received:  Lactic acid = 2.3  Date of notification:  08/31/2015  Time of notification:  2137  Critical value read back:Yes.    Nurse who received alert:  Annabell Howells, RN  MD notified (1st page):  Tama Gander, NP for [facility]  Time of first page:  2141  Responding MD:  Tama Gander, NP  Time MD responded:  2150  Notes: Patient has been lethargic since admission to nursing unit.  Arouses easily, but is disoriented to time, place and situation with poor recall.  250 ml IV NS bolus was given for elevated lactic acid level shortly after order received with negative response by repeat lab.  Current lactic acid level 2.2.  Patient has just returned from CT of abdomen and pelvis to follow-up for RLQ ABD pain; result pending.  Paged NP on-call for [facility] to make aware of new lab value and pending CT results.  No change in patient condition at this time.  Continuing to monitor closely.

## 2015-08-18 NOTE — H&P (Signed)
[facility] History and Physical                                                                                    Megan Vasquez, is a 74 y.o. female  MRN: [id number]   DOB - [birth year]  Admit Date - 08/19/2015  Outpatient Primary MD for the patient is [name], MD  Referring MD: Denton Lank / ER  With History of -  Past Medical History  Diagnosis Date  . Diabetes mellitus without complication (HCC)   . Hypertension   . Hyperlipidemia   . Atrial fibrillation (HCC)   . ESRD (end stage renal disease) (HCC)     HD since 06/2000  . CAD (coronary artery disease)   . 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 History of nuclear stress test 10/2012    lexiscan; normal stress test; post-stress EF72%  . Peripheral arterial disease (HCC)   . Osteoporosis   . Mitral regurgitation     severe by 2-D echocardiogram performed 10/22/14      Past Surgical History  Procedure Laterality Date  . Av fistula placement  2007    L brachial cephalic   . Transthoracic echocardiogram  12/2010    EF=>55%, mod conc LVH; mild mitral annular calcif; mild MR; trace TR; trace PV regurg  . Lower extremity arterial doppler  10/02/2013    bilat ABIs could not be obtained (occluded vessels); bilat TBIs - no flow visualized; R mid SFA 70-99% diameter reduction; bilat runoff with posterior tibial, anterior tibial, peroneal arteries with occlusive disease   . Cardiac catheterization  12/19/2005    3 vessel disease with normal LV function, positive Cardiolite --> subsequent CABG (Dr. [name])  . Coronary artery bypass graft  01/18/2006    LIMA to LAD, vein to diagonal, vein to marginal branch of Cfx, vein to distal RCA (Dr. Wayland Salinas)  . Carotid doppler  02/24/2009    R bulb 0-49% diameter refuction, R & L ICAs with significatn tortuosity (source of bruit?)   . Abdominal  aortagram N/A 03/24/2014    Procedure: ABDOMINAL Ronny Flurry;  Surgeon: Chuck Hint, MD;  Location: [facility] CATH LAB;  Service: Cardiovascular;  Laterality: N/A;    in for   Chief Complaint  Patient presents with  . Weakness     HPI This is a 74 yo female patient chronic kidney disease on dialysis on Tuesday Thursday Saturday, chronic atrial fibrillation on warfarin and rate control, chronic kidney disease with anemia, mild baseline chronic thrombocytopenia (platelets are typically around 120,000), diabetes on insulin, hypertension, recent diagnosis of severe mitral regurgitation asymptomatic December 2015 per echocardiogram, hypothyroidism, dyslipidemia and known CAD. Patient was brought to the hospital today by her grandson because of altered mentation abrupt onset this morning. According to family patient had not been eating well over the past 24 hours and this morning was not as alert as baseline. No apparent fevers or chills, no GI symptoms such as diarrhea nausea or vomiting, patient makes small amount of  urine but no apparent dysuria. Patient did not wish to take her medications this morning.  In the ER the patient was afebrile, initial respiratory rate elevated at 35, pulse was 137 and irregular consistent with atrial fibrillation RVR, blood pressure was 117/71, room air sats were 94%. Patient was showing given 10 mg of IV Cardizem but RVR persisted so she was given another 50 mg of IV Cardizem and started on a Cardizem infusion. Chest x-ray showed cardiomegaly with vascular congestion noting patient due for dialysis today. Patient not overtly volume overloaded on exam. Patient respiratory rate has remained slightly elevated between 25 and 29 and heart rate continues to vacillate between 105 bpm 120 bpm. Room air saturations remained between 96 and 97%. Electrolyte panel unremarkable except for BUN 58 and creatinine 7.23 noting patient do dialysis today, anion gap 17, BNP 1556 but unclear  [place] patient on dialysis, troponin 0.03. White count normal, hemoglobin 8.8 with previous baseline around 10-11. Platelets 80,000 with previous reading anywhere between 107,000 and 128,000. INR therapeutic at 2.3, TSH was found to be elevated at 16.864. Urinalysis was obtained that was an inappropriate specimen noting any squamous epithelials so repeat specimen requested via catheterization. He causes of altered mentation as CT of the head was also obtained this revealed no acute abnormality. Visual EKG revealed atrial fibrillation with rapid ventricular response with a ventricular rate of 149 bpm, QTC 482 ms.   Review of Systems   In addition to the HPI above,  No Fever-chills, myalgias or other constitutional symptoms No Headache, changes with Vision or hearing, new weakness, tingling, numbness in any extremity, No problems swallowing food or Liquids, indigestion/reflux No Chest pain, Cough or Shortness of Breath, palpitations, orthopnea or DOE No N/V; no melena or hematochezia, no dark tarry stools, Bowel movements are regular, No dysuria, hematuria or flank pain No new skin rashes, lesions, masses or bruises, No new joints pains-aches No recent weight gain or loss No polyuria, polydypsia or polyphagia,  *A full 10 point Review of Systems was done, except as stated above, all other Review of Systems were negative.  Social History Social History  Substance Use Topics  . Smoking status: Former Smoker    Quit date: 06/10/2009  . Smokeless tobacco: Never Used  . Alcohol Use: No    Resides at: Private residence  Lives with: Grandson  Ambulatory status: Walker   Family History Family History  Problem Relation Age of Onset  . Heart disease Mother   . Heart attack Brother   . Breast cancer Sister   . Diabetes Sister   . Heart attack Sister   . Diabetes Child      Prior to Admission medications   Medication Sig Start Date End Date Taking? Authorizing Provider  calcium  acetate (PHOSLO) 667 MG capsule Take 1,334 mg by mouth 3 (three) times daily with meals.  12/21/13  Yes Historical Provider, MD  diltiazem (CARDIZEM CD) 240 MG 24 hr capsule TAKE ONE CAPSULE BY MOUTH DAILY 05/19/15  Yes Runell Gess, MD  insulin glargine (LANTUS) 100 UNIT/ML injection Inject 10 Units into the skin 2 (two) times daily. 10 units twice a day   Yes Historical Provider, MD  levothyroxine (SYNTHROID, LEVOTHROID) 200 MCG tablet Take 200 mcg by mouth daily before breakfast. Take with to equal total dose of   Yes Historical Provider, MD  levothyroxine (SYNTHROID, LEVOTHROID) 50 MCG tablet Take 50 mcg by mouth daily. Take  With to equal 10/14/14  Yes Historical Provider, MD  metoprolol succinate (TOPROL-XL) 50 MG 24 hr tablet Take 1/2 tablet (25mg ) by mouth daily. Patient taking differently: Take 25 mg by mouth daily.  11/17/14  Yes Runell Gess, MD  pravastatin (PRAVACHOL) 40 MG tablet Take 40 mg by mouth every morning.    Yes Historical Provider, MD  warfarin (COUMADIN) 3 MG tablet Take 4.5-6 mg by mouth daily. Take 2 tablets (6mg ) on Monday, and 1 1/2 (4.5MG ) tablet all other days   Yes Historical Provider, MD  acetaminophen (TYLENOL) 325 MG tablet Take 325 mg by mouth every 6 (six) hours as needed for moderate pain.    Historical Provider, MD  Calcium Carbonate (CALCIUM 600 PO) Take 1 tablet by mouth 3 (three) times daily.     Historical Provider, MD  diltiazem (CARDIZEM CD) 240 MG 24 hr capsule Take 240 mg by mouth daily.    Historical Provider, MD  lidocaine-prilocaine (EMLA) cream Apply 1 application topically Every Tuesday,Thursday,and Saturday with dialysis.    Historical Provider, MD  metoprolol succinate (TOPROL-XL) 50 MG 24 hr tablet Take 25 mg by mouth daily. Take with or immediately following a meal.    Historical Provider, MD  OVER THE COUNTER MEDICATION Apply 1 application topically Every Tuesday,Thursday,and Saturday with dialysis. Diabetic foot cream  used for HD    Historical Provider, MD  warfarin (COUMADIN) 3 MG tablet Take 1.5 to 2 tablets by mouth daily as directed by coumadin clinic Patient not taking: Reported on 08/25/2015 01/05/15   Lennette Bihari, MD  warfarin (COUMADIN) 3 MG tablet TAKE 1.5 TO 2 TABLETS BY MOUTH DAILY AS DIRECTED BY COUMADIN CLINIC Patient not taking: Reported on 08/31/2015 08/17/15   Runell Gess, MD    No Known Allergies  Physical Exam  Vitals  Blood pressure 107/58, pulse 120, temperature 98.8 F (37.1 C), temperature source Oral, resp. rate 25, SpO2 97 %.   General:  In no acute distress although is complaining of right lower quadrant abdominal pain after exam, appears stated age  Psych:  Normal affect, Denies Suicidal or Homicidal ideations, Awake Alert, Oriented X 3. Speech and thought patterns are clear and appropriate, minimal short term memory deficits  Neuro:   No focal neurological deficits, CN II through XII intact, Strength 5/5 all 4 extremities, Sensation intact all 4 extremities.  ENT:  Ears and Eyes appear Normal, Conjunctivae clear, PER. Moist oral mucosa without erythema or exudates.  Neck:  Supple, No lymphadenopathy appreciated  Respiratory:  Symmetrical chest wall movement, Good air movement bilaterally, CTAB DP posterior bibasilar crackles. Room Air  Cardiac:  RRR, 4/6 systolic murmur left sternal border fifth intercostal space radiating axilla, no LE edema noted, no JVD, No carotid bruits, peripheral pulses palpable at 2+  Abdomen:  Positive bowel sounds, Soft, focal right lower quadrant tenderness, Non distended,  No masses appreciated, no obvious hepatosplenomegaly  Genitourinary: No CVAT appreciated  Skin:  No Cyanosis, Normal Skin Turgor, No Skin Rash or Bruise.  Extremities: Symmetrical without obvious trauma or injury,  no effusions.  Data Review  CBC  Recent Labs Lab 09/12/2015 1120  WBC 9.7  HGB 8.8*  HCT 29.4*  PLT 80*  MCV 98.3  MCH 29.4  MCHC 29.9*    RDW 16.3*  LYMPHSABS 0.4*  MONOABS 0.4  EOSABS 0.0  BASOSABS 0.0    Chemistries   Recent Labs Lab 09/10/2015 1120  NA 140  K 4.2  CL 96*  CO2 27  GLUCOSE 179*  BUN 58*  CREATININE 7.23*  CALCIUM 7.8*    CrCl cannot be calculated (Unknown ideal weight.).   Recent Labs  2015-08-22 1120  TSH 16.864*    Coagulation profile  Recent Labs Lab 08-22-15 1120  INR 2.32*    No results for input(s): DDIMER in the last 72 hours.  Cardiac Enzymes No results for input(s): CKMB, TROPONINI, MYOGLOBIN in the last 168 hours.  Invalid input(s): CK  Invalid input(s): POCBNP  Urinalysis    Component Value Date/Time   COLORURINE YELLOW Aug 22, 2015 1227   APPEARANCEUR CLEAR 08/22/15 1227   LABSPEC 1.015 08/22/2015 1227   PHURINE 8.5* 08-22-15 1227   GLUCOSEU NEGATIVE 08-22-15 1227   HGBUR TRACE* 08-22-15 1227   BILIRUBINUR NEGATIVE 22-Aug-2015 1227   KETONESUR NEGATIVE August 22, 2015 1227   PROTEINUR 100* 08-22-15 1227   UROBILINOGEN 1.0 08-22-15 1227   NITRITE NEGATIVE 2015-08-22 1227   LEUKOCYTESUR NEGATIVE 08-22-2015 1227    Imaging results:   Ct Head Wo Contrast  August 22, 2015   CLINICAL DATA:  Altered mental status, generalized weakness for 2 days.  EXAM: CT HEAD WITHOUT CONTRAST  TECHNIQUE: Contiguous axial images were obtained from the base of the skull through the vertex without intravenous contrast.  COMPARISON:  01/24/2006  FINDINGS: Old left occipital infarct. No acute intracranial abnormality. Specifically, no hemorrhage, hydrocephalus, mass lesion, acute infarction, or significant intracranial injury. No acute calvarial abnormality. Visualized paranasal sinuses and mastoids clear. Orbital soft tissues unremarkable.  IMPRESSION: No acute intracranial abnormality.  Old left occipital infarct.   Electronically Signed   By: Charlett Nose M.D.   On: 2015/08/22 13:00   Dg Chest Portable 1 View  08-22-2015   CLINICAL DATA:  Tachycardia.  EXAM: PORTABLE CHEST 1  VIEW  COMPARISON:  07/22/2014  FINDINGS: Prior median sternotomy and CABG. Cardiomegaly. Mild vascular congestion. No overt edema. No effusions. No acute bony abnormality.  IMPRESSION: Cardiomegaly with vascular congestion.  No overt failure.   Electronically Signed   By: Charlett Nose M.D.   On: 08/22/2015 11:48     EKG: (Independently reviewed) atrial fibrillation ventricular response 149 bpm, QTC 492 ms, downsloping ST segment nonspecific and likely rate related, no overt ischemic changes   Assessment & Plan  Principal Problem:   Atrial fibrillation with RVR (HCC)/neck anticoagulation -Admit to stepdown -Continue Cardizem infusion -Hold home oral Cardizem to continue Lopressor at low dose but monitor for hypotension -Pharmacy managing warfarin -CHADVASc = 6  Active Problems:   RLQ abdominal pain -Uncertain etiology -Check CT of abdomen and pelvis to rule out appendicitis versus retroperitoneal bleeding in patient with what appears to be progressive anemia and thrombocytopenia -Check urinalysis to rule out UTI-cath specimen pending -Obtain blood cultures empirically -Low-dose IV morphine and Tylenol for pain -Check lipase, hepatic function panel, lactic acid and Procalcitonin    Thrombocytopenia (HCC)/Anemia, chronic renal failure -Hemoglobin has decreased from a baseline of around 10-11-8.8 and uncertain if this is delusional or indicative of bleeding -More concerning in setting of worsening thrombocytopenia -CT of abdomen as above -Check LDH and haptoglobin to rule out hemolysis -Repeat labs in a.m. -No indication for transfusion at this juncture    Metabolic encephalopathy -Patient appears to return to baseline -Consider infectious etiology-workup as above -May also improve after additional hemodialysis noting is due today -Check ammonia level    Diabetes mellitus, insulin dependent (IDDM), controlled (HCC) -Poor oral intake so will hold Lantus insulin for now -Check CBGs  and provide SSI -Check hemoglobin A1c    HTN (hypertension) -Current blood  pressure well controlled -Beta blocker and calcium channel blockers as noted above in atrial fibrillation    CKD (chronic kidney disease) stage V requiring chronic dialysis (HCC) -Dialysis due today -Nephrology notified by EDP    Severe mitral regurgitation -Discovered on echocardiogram last year -Due for repeat echocardiogram December 2016 that since admitted with atrial fibrillation RVR Will go ahead and obtain an echocardiogram this admission    Hypothyroidism -TSH 16 in ER -Check free T4 and T3 -Resume preadmission Synthroid until above labs resulted    HLD (hyperlipidemia) -Continue Pravachol    Coronary artery disease -Currently not endorsing chest pain    DVT Prophylaxis: Warfarin  Family Communication: Family member at bedside    Code Status: Full code   Condition:  Stable  Discharge disposition: Anticipate discharge in next 24-48 hours pending medical stability and results of above workup  Time spent in minutes : 60      ELLIS,ALLISON L. ANP on 2015/08/27 at 3:02 PM  Between 7am to 7pm - Pager - [phone]  After 7pm go to [email or web address] - password [email or web address]  And look for the night coverage person covering me after hours  [facility]

## 2015-08-18 NOTE — ED Notes (Signed)
Pt. Presents with complaint of generalized weakness x 2 days. Pt. Family states she has had increased lethargy and decreased appetite. Pt. Is dialysis pt, due today for txt. Last txt Saturday. Pt. Is alert and oriented. Hx of afib.

## 2015-08-19 ENCOUNTER — Inpatient Hospital Stay (HOSPITAL_COMMUNITY): Payer: Medicare PPO

## 2015-08-19 ENCOUNTER — Encounter (HOSPITAL_COMMUNITY): Payer: Self-pay | Admitting: Radiology

## 2015-08-19 DIAGNOSIS — E039 Hypothyroidism, unspecified: Secondary | ICD-10-CM

## 2015-08-19 DIAGNOSIS — D696 Thrombocytopenia, unspecified: Secondary | ICD-10-CM

## 2015-08-19 DIAGNOSIS — I340 Nonrheumatic mitral (valve) insufficiency: Secondary | ICD-10-CM

## 2015-08-19 DIAGNOSIS — K559 Vascular disorder of intestine, unspecified: Secondary | ICD-10-CM | POA: Diagnosis not present

## 2015-08-19 LAB — T4, FREE: Free T4: 0.52 ng/dL — ABNORMAL LOW (ref 0.61–1.12)

## 2015-08-19 LAB — URINE CULTURE

## 2015-08-19 LAB — COMPREHENSIVE METABOLIC PANEL
ALBUMIN: 1.6 g/dL — AB (ref 3.5–5.0)
ALK PHOS: 81 U/L (ref 38–126)
ALT: 22 U/L (ref 14–54)
AST: 53 U/L — AB (ref 15–41)
Anion gap: 11 (ref 5–15)
BILIRUBIN TOTAL: 0.6 mg/dL (ref 0.3–1.2)
BUN: 24 mg/dL — AB (ref 6–20)
CALCIUM: 7.1 mg/dL — AB (ref 8.9–10.3)
CO2: 30 mmol/L (ref 22–32)
CREATININE: 3.82 mg/dL — AB (ref 0.44–1.00)
Chloride: 92 mmol/L — ABNORMAL LOW (ref 101–111)
GFR calc Af Amer: 12 mL/min — ABNORMAL LOW (ref >60)
GFR, EST NON AFRICAN AMERICAN: 11 mL/min — AB (ref >60)
GLUCOSE: 218 mg/dL — AB (ref 65–99)
Potassium: 3.4 mmol/L — ABNORMAL LOW (ref 3.5–5.1)
Sodium: 133 mmol/L — ABNORMAL LOW (ref 135–145)
TOTAL PROTEIN: 4.5 g/dL — AB (ref 6.5–8.1)

## 2015-08-19 LAB — CBC
HEMATOCRIT: 26.5 % — AB (ref 36.0–46.0)
Hemoglobin: 8 g/dL — ABNORMAL LOW (ref 12.0–15.0)
MCH: 29.3 pg (ref 26.0–34.0)
MCHC: 30.2 g/dL (ref 30.0–36.0)
MCV: 97.1 fL (ref 78.0–100.0)
PLATELETS: 76 10*3/uL — AB (ref 150–400)
RBC: 2.73 MIL/uL — ABNORMAL LOW (ref 3.87–5.11)
RDW: 16.1 % — AB (ref 11.5–15.5)
WBC: 5.9 10*3/uL (ref 4.0–10.5)

## 2015-08-19 LAB — LACTIC ACID, PLASMA
Lactic Acid, Venous: 2.2 mmol/L (ref 0.5–2.0)
Lactic Acid, Venous: 1.7 mmol/L (ref 0.5–2.0)

## 2015-08-19 LAB — GLUCOSE, CAPILLARY
GLUCOSE-CAPILLARY: 133 mg/dL — AB (ref 65–99)
GLUCOSE-CAPILLARY: 193 mg/dL — AB (ref 65–99)
GLUCOSE-CAPILLARY: 147 mg/dL — AB (ref 65–99)
Glucose-Capillary: 162 mg/dL — ABNORMAL HIGH (ref 65–99)
Glucose-Capillary: 184 mg/dL — ABNORMAL HIGH (ref 65–99)

## 2015-08-19 LAB — PROTIME-INR
INR: 2.33 — ABNORMAL HIGH (ref 0.00–1.49)
Prothrombin Time: 25.3 seconds — ABNORMAL HIGH (ref 11.6–15.2)

## 2015-08-19 LAB — MRSA PCR SCREENING: MRSA BY PCR: NEGATIVE

## 2015-08-19 MED ORDER — WARFARIN SODIUM 2.5 MG PO TABS
4.5000 mg | ORAL_TABLET | Freq: Once | ORAL | Status: AC
  Administered 2015-08-19: 4.5 mg via ORAL
  Filled 2015-08-19 (×2): qty 1

## 2015-08-19 MED ORDER — DEXTROSE 50 % IV SOLN: 1.0000 | Freq: Once | INTRAVENOUS | Status: DC

## 2015-08-19 MED ORDER — PIPERACILLIN-TAZOBACTAM IN DEX 2-0.25 GM/50ML IV SOLN
2.2500 g | Freq: Three times a day (TID) | INTRAVENOUS | Status: DC
Start: 2015-08-19 — End: 2015-08-22
  Administered 2015-08-19 – 2015-08-21 (×9): 2.25 g via INTRAVENOUS
  Filled 2015-08-19 (×13): qty 50

## 2015-08-19 MED ORDER — DILTIAZEM HCL 30 MG PO TABS
30.0000 mg | ORAL_TABLET | Freq: Four times a day (QID) | ORAL | Status: DC
  Administered 2015-08-19 – 2015-08-21 (×10): 30 mg via ORAL
  Filled 2015-08-19 (×10): qty 1

## 2015-08-19 MED ORDER — BOOST / RESOURCE BREEZE PO LIQD
1.0000 | Freq: Three times a day (TID) | ORAL | Status: DC
  Administered 2015-08-19 – 2015-09-01 (×23): 1 via ORAL

## 2015-08-19 MED ORDER — CALCIUM CARBONATE 1250 (500 CA) MG PO TABS
1250.0000 mg | ORAL_TABLET | Freq: Every day | ORAL | Status: DC
  Administered 2015-08-19 – 2015-08-21 (×3): 1250 mg via ORAL
  Filled 2015-08-19 (×2): qty 3

## 2015-08-19 NOTE — Progress Notes (Signed)
Radiologist called critical CT report to this RN; suspicion for necrotizing enterocolitis. Paged Tama Gander, NP for [facility] to make aware. Included radiologist callback number in text page.  Most recent vital signs unchanged from previous values: Filed Vitals:   08/19/15 0152  BP: 96/51  Pulse: 96  Temp:   Resp: 29  SPO2:  96% on O2 @ 2L/minute via nasal canula  Continuing to closely monitor.

## 2015-08-19 NOTE — Progress Notes (Signed)
  [facility] Progress Note   Subjective: CT abd showing pneumatosis of R colon, seen by surg , suspect ischemic colitis  Filed Vitals:   08/19/15 0552 08/19/15 0749 08/19/15 1004 08/19/15 1125  BP: 102/54 94/50 103/71 109/50  Pulse: 109 123 132 119  Temp:  99.5 F (37.5 C)  99.6 F (37.6 C)  TempSrc:  Oral  Oral  Resp: 31   37  Weight:      SpO2: 97% 95%  93%   Exam: Frail elderly AAF no distress No jvd Chest clear bilat Irreg cardiac rhythm, no mrg Abd soft , mild RLQ tenderness, +BS No LE edema L arm AVF +bruit Neuro gen'd weakness 4-/5, Ox 3, nonfocal  CXR clear  TTS NW    4 hrs 65.5kgs 2K/2Ca L AVF 1000u hep hectorol 2 venofor 100 q HD until 10/8 then 50 q week Micera 100 q2 weeks - to start 10/6      Assessment: 1 Abd pain/ pneumatosis of colon by CT - seen by surg, poss ischemic colitis; conservative Rx for now 2 ESRD HD tts 3 Afib on MTP/ diltiazem 4 Hypotension - borderline BP's 5 Volume - looks dry clinically 6 Hx CABG 7 Anemia Hb 8.0, cont FE/ esa  Plan - HD tomorrow, keep even, no hep    Vinson Moselle MD  pager [phone]    cell [phone]  08/19/2015, 12:45 PM     Recent Labs Lab 08/26/2015 1120 08/19/15 0025  NA 140 133*  K 4.2 3.4*  CL 96* 92*  CO2 27 30  GLUCOSE 179* 218*  BUN 58* 24*  CREATININE 7.23* 3.82*  CALCIUM 7.8* 7.1*    Recent Labs Lab 08/26/2015 2020 08/19/15 0025  AST 61* 53*  ALT 22 22  ALKPHOS 96 81  BILITOT 0.9 0.6  PROT 5.2* 4.5*  ALBUMIN 1.8* 1.6*    Recent Labs Lab 08/22/2015 1120 08/19/15 0025  WBC 9.7 5.9  NEUTROABS 8.9*  --   HGB 8.8* 8.0*  HCT 29.4* 26.5*  MCV 98.3 97.1  PLT 80* 76*   . calcium acetate  1,334 mg Oral TID WC  . calcium carbonate  1,250 mg Oral Q breakfast  . darbepoetin (ARANESP) injection - DIALYSIS  150 mcg Intravenous Q Tue-HD  . diltiazem  30 mg Oral 4 times per day  . doxercalciferol  2 mcg Intravenous Q T,Th,Sa-HD  . ferric gluconate  (FERRLECIT/NULECIT) IV  125 mg Intravenous Q T,Th,Sa-HD  . insulin aspart  0-9 Units Subcutaneous 6 times per day  . levothyroxine  250 mcg Oral QAC breakfast  . metoprolol succinate  25 mg Oral Daily  . multivitamin  1 tablet Oral QHS  . piperacillin-tazobactam (ZOSYN)  IV  2.25 g Intravenous 3 times per day  . pneumococcal 23 valent vaccine  0.5 mL Intramuscular Tomorrow-1000  . pravastatin  40 mg Oral q1800  . sodium chloride  3 mL Intravenous Q12H  . Warfarin - Pharmacist Dosing Inpatient   Does not apply q1800   . diltiazem (CARDIZEM) infusion 5 mg/hr (09/12/2015 2000)   sodium chloride, acetaminophen **OR** acetaminophen, morphine injection, ondansetron **OR** ondansetron (ZOFRAN) IV, sodium chloride

## 2015-08-19 NOTE — Progress Notes (Addendum)
ANTICOAGULATION CONSULT NOTE - Follow up  Pharmacy Consult for warfarin Indication: h/o atrial fibrillation  No Known Allergies  Patient Measurements: Height:  (Patient unsure) Weight: 141 lb 14.4 oz (64.365 kg)  Vital Signs: Temp: 99.6 F (37.6 C) (10/05 1125) Temp Source: Oral (10/05 1125) BP: 100/55 mmHg (10/05 1316) Pulse Rate: 119 (10/05 1125)  Labs:  Recent Labs  09/01/2015 1120 08/19/15 0025  HGB 8.8* 8.0*  HCT 29.4* 26.5*  PLT 80* 76*  LABPROT 25.2* 25.3*  INR 2.32* 2.33*  CREATININE 7.23* 3.82*    CrCl cannot be calculated (Unknown ideal weight.).   Medical History: Past Medical History  Diagnosis Date  . Diabetes mellitus without complication (HCC)   . Hypertension   . Hyperlipidemia   . Atrial fibrillation (HCC)   . ESRD (end stage renal disease) (HCC)     HD since 06/2000  . CAD (coronary artery disease)   . 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 History of nuclear stress test 10/2012    lexiscan; normal stress test; post-stress EF72%  . Peripheral arterial disease (HCC)   . Osteoporosis   . Mitral regurgitation     severe by 2-D echocardiogram performed 10/22/14    Medications:  Prescriptions prior to admission  Medication Sig Dispense Refill Last Dose  . calcium acetate (PHOSLO) 667 MG capsule Take 1,334 mg by mouth 3 (three) times daily with meals.    08/17/2015 at Unknown time  . diltiazem (CARDIZEM CD) 240 MG 24 hr capsule TAKE ONE CAPSULE BY MOUTH DAILY 30 capsule 6 08/17/2015 at Unknown time  . insulin glargine (LANTUS) 100 UNIT/ML injection Inject 10 Units into the skin 2 (two) times daily. 10 units twice a day   08/17/2015 at Unknown time  . levothyroxine (SYNTHROID, LEVOTHROID) 200 MCG tablet Take 200 mcg by mouth daily before breakfast. Take with to equal total dose of   08/17/2015 at Unknown time  .  levothyroxine (SYNTHROID, LEVOTHROID) 50 MCG tablet Take 50 mcg by mouth daily. Take  With to equal   08/17/2015 at Unknown time  . metoprolol succinate (TOPROL-XL) 50 MG 24 hr tablet Take 1/2 tablet ( ) by mouth daily. (Patient taking differently: Take 25 mg by mouth daily. ) 30 tablet 10 08/17/2015 at Unknown time  . pravastatin (PRAVACHOL) 40 MG tablet Take 40 mg by mouth every morning.    08/17/2015 at Unknown time  . warfarin (COUMADIN) 3 MG tablet Take 4.5-6 mg by mouth daily. Take 2 tablets ( ) on Monday, and 1 1/2 (4.5MG ) tablet all other days   08/17/2015 at Unknown time  . acetaminophen (TYLENOL) 325 MG tablet Take 325 mg by mouth every 6 (six) hours as needed for moderate pain.   Past Month at Unknown time  . Calcium Carbonate (CALCIUM 600 PO) Take 1 tablet by mouth 3 (three) times daily.    Taking  . diltiazem (CARDIZEM CD) 240 MG 24 hr capsule Take 240 mg by mouth daily.   11/13/2014 at Unknown time  . lidocaine-prilocaine (EMLA) cream Apply 1 application topically Every Tuesday,Thursday,and Saturday with dialysis.   11/13/2014 at Unknown time  . metoprolol succinate (TOPROL-XL) 50 MG 24 hr tablet Take 25 mg by mouth daily. Take with or immediately following a meal.   11/13/2014 at 0700  . OVER THE COUNTER MEDICATION Apply 1 application  topically Every Tuesday,Thursday,and Saturday with dialysis. Diabetic foot cream used for HD   Taking  . warfarin (COUMADIN) 3 MG tablet Take 1.5 to 2 tablets by mouth daily as directed by coumadin clinic (Patient not taking: Reported on 2015/09/04) 150 tablet 1   . warfarin (COUMADIN) 3 MG tablet TAKE 1.5 TO 2 TABLETS BY MOUTH DAILY AS DIRECTED BY COUMADIN CLINIC (Patient not taking: Reported on 04-Sep-2015) 50 tablet 3     Assessment: 53 yoF w/ ESRD on HD presented pm 09-04-15 with 2d history of lethargy, decreased appetite and AMS. On chronic coumadin for afib; continues coumadin inpatient.  INR today = 2.33, remains therapeutic.  H/o  anemia, ESRD on HD qTTs.  Hgb 8.0, down form 8.8 on admit. PLTC = 80K low on admit and now 76K. No bleeding noted. On Aranesp and IV Nulecit (iron).   Home warfarin dose = 6 mg on Mondays, 4.5 mg all other days. Last taken PTA on 10/3 Monday.   Goal of Therapy:  INR 2-3 Monitor platelets by anticoagulation protocol: Yes   Plan:  Warfarin 4.5 mg po x1 Monitor platelets closely Daily INR, CBC q72h  Noah Delaine, RPh Clinical Pharmacist Pager: [phone] 08/19/2015,1:20 PM

## 2015-08-19 NOTE — Progress Notes (Signed)
Shift event note:  Notified by RN regarding positive findings on ct abd/pelvis w/ cm. Per Dr Cherly Hensen (radiologist) findings can not exclude early changes associated w/ necrotizing enterocolitis to RLQ. Saw pt at bedside where she was noted resting in NAD. She awakens easily and is oriented to person. Abd is noted to be slightly distended but soft to palpation w/ bs in all quadrants. Pt reports 9/10 RLQ abd pain w/ palpation but her objective response if much more muted so unsure if pt is a reliable historian. VSS and pt remains afebrile. Discussed pt w/ Dr Janee Morn w/ surgery service who has agreed to evaluate pt. Will continue to monitor closely on SDU.   Leanne Chang, NP-C [facility] Pager [phone]

## 2015-08-19 NOTE — Consult Note (Signed)
Reason for Consult:R colon pneumatosis Referring Physician: Reyne Dumas  Megan Vasquez is an 74 y.o. female.  HPI: Megan Vasquez has a history multiple significant medical problems including end-stage renal disease, atrial fibrillation, coronary artery disease, hypertension, and diabetes. She developed right-sided abdominal pain 4 days ago. She seemed to get worse overall at home and a family member called EMS. She was transported to the hospital and found to be in atrial fibrillation with rapid ventricular response. She was admitted by the hospitalist service and this was treated with diltiazem infusion. Due to her complaint of abdominal pain, CT scan abdomen and pelvis was ordered. This was completed early this morning and showed some pneumatosis involving her right colon. I was asked to see her in consultation for surgical evaluation of this issue. At this time, she is resting comfortably but does complain of some right-sided abdominal pain. She notes that her bowel movements have been normal.  Past Medical History  Diagnosis Date  . Diabetes mellitus without complication ([place])   . Hypertension   . Hyperlipidemia   . Atrial fibrillation ([place])   . ESRD (end stage renal disease) ([place])     HD since 06/2000  . CAD (coronary artery disease)   . 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 History of nuclear stress test 10/2012    lexiscan; normal stress test; post-stress EF72%  . Peripheral arterial disease ([place])   . Osteoporosis   . Mitral regurgitation     severe by 2-D echocardiogram performed 10/22/14    Past Surgical History  Procedure Laterality Date  . Av fistula placement  2007    L brachial cephalic   . Transthoracic echocardiogram  12/2010    EF=>55%, mod conc LVH; mild mitral annular calcif; mild MR; trace TR; trace PV regurg  . Lower extremity arterial  doppler  10/02/2013    bilat ABIs could not be obtained (occluded vessels); bilat TBIs - no flow visualized; R mid SFA 70-99% diameter reduction; bilat runoff with posterior tibial, anterior tibial, peroneal arteries with occlusive disease   . Cardiac catheterization  12/19/2005    3 vessel disease with normal LV function, positive Cardiolite --> subsequent CABG (Dr. Adora Fridge)  . Coronary artery bypass graft  01/18/2006    LIMA to LAD, vein to diagonal, vein to marginal branch of Cfx, vein to distal RCA (Dr. Ellison Hughs)  . Carotid doppler  02/24/2009    R bulb 0-49% diameter refuction, R & L ICAs with significatn tortuosity (source of bruit?)   . Abdominal aortagram N/A 03/24/2014    Procedure: ABDOMINAL Maxcine Ham;  Surgeon: Angelia Mould, MD;  Location: [facility] CATH LAB;  Service: Cardiovascular;  Laterality: N/A;    Family History  Problem Relation Age of Onset  . Heart disease Mother   . Heart attack Brother   . Breast cancer Sister   . Diabetes Sister   . Heart attack Sister   . Diabetes Child     Social History:  reports that she quit smoking about 6 years ago. She has never used smokeless tobacco. She reports that she does not drink alcohol or use illicit drugs.  Allergies: No Known Allergies  Medications:  Scheduled: . calcium acetate  1,334 mg Oral TID WC  . calcium carbonate  1,250 mg Oral Q breakfast  . darbepoetin (ARANESP) injection - DIALYSIS  150 mcg Intravenous Q Tue-HD  . doxercalciferol  2 mcg Intravenous Q T,Th,Sa-HD  . ferric gluconate (FERRLECIT/NULECIT) IV  125 mg Intravenous Q T,Th,Sa-HD  . Influenza vac split quadrivalent PF  0.5 mL Intramuscular Tomorrow-1000  . insulin aspart  0-9 Units Subcutaneous 6 times per day  . levothyroxine  250 mcg Oral QAC breakfast  . metoprolol succinate  25 mg Oral Daily  . multivitamin  1 tablet Oral QHS  . pneumococcal 23 valent vaccine  0.5 mL Intramuscular Tomorrow-1000  . pravastatin  40 mg Oral q1800  . sodium chloride   3 mL Intravenous Q12H  . Warfarin - Pharmacist Dosing Inpatient   Does not apply q1800   Continuous: . diltiazem (CARDIZEM) infusion 5 mg/hr (09/07/2015 2000)   IPJ:ASNKNL chloride, acetaminophen **OR** acetaminophen, morphine injection, ondansetron **OR** ondansetron (ZOFRAN) IV, sodium chloride  Results for orders placed or performed during the hospital encounter of 08/22/2015 (from the past 48 hour(s))  MRSA PCR Screening     Status: None   Collection Time: 08/16/2015  9:05 AM  Result Value Ref Range   MRSA by PCR NEGATIVE NEGATIVE    Comment:        The GeneXpert MRSA Assay (FDA approved for NASAL specimens only), is one component of a comprehensive MRSA colonization surveillance program. It is not intended to diagnose MRSA infection nor to guide or monitor treatment for MRSA infections.   Brain natriuretic peptide     Status: Abnormal   Collection Time: 09/04/2015 11:20 AM  Result Value Ref Range   B Natriuretic Peptide 1556.0 (H) 0.0 - 100.0 pg/mL  Basic metabolic panel     Status: Abnormal   Collection Time: 08/23/2015 11:20 AM  Result Value Ref Range   Sodium 140 135 - 145 mmol/L   Potassium 4.2 3.5 - 5.1 mmol/L   Chloride 96 (L) 101 - 111 mmol/L   CO2 27 22 - 32 mmol/L   Glucose, Bld 179 (H) 65 - 99 mg/dL   BUN 58 (H) 6 - 20 mg/dL   Creatinine, Ser 7.23 (H) 0.44 - 1.00 mg/dL   Calcium 7.8 (L) 8.9 - 10.3 mg/dL   GFR calc non Af Amer 5 (L) >60 mL/min   GFR calc Af Amer 6 (L) >60 mL/min    Comment: (NOTE) The eGFR has been calculated using the CKD EPI equation. This calculation has not been validated in all clinical situations. eGFR's persistently <60 mL/min signify possible Chronic Kidney Disease.    Anion gap 17 (H) 5 - 15  CBC with Differential     Status: Abnormal   Collection Time: 09/06/2015 11:20 AM  Result Value Ref Range   WBC 9.7 4.0 - 10.5 K/uL   RBC 2.99 (L) 3.87 - 5.11 MIL/uL   Hemoglobin 8.8 (L) 12.0 - 15.0 g/dL   HCT 29.4 (L) 36.0 - 46.0 %   MCV 98.3  78.0 - 100.0 fL   MCH 29.4 26.0 - 34.0 pg   MCHC 29.9 (L) 30.0 - 36.0 g/dL   RDW 16.3 (H) 11.5 - 15.5 %   Platelets 80 (L) 150 - 400 K/uL    Comment: PLATELET COUNT CONFIRMED BY SMEAR   Neutrophils Relative % 92 %   Neutro Abs 8.9 (H) 1.7 - 7.7 K/uL   Lymphocytes Relative 4 %   Lymphs Abs 0.4 (L) 0.7 - 4.0 K/uL   Monocytes Relative 4 %   Monocytes Absolute 0.4 0.1 - 1.0 K/uL   Eosinophils Relative 0 %   Eosinophils  Absolute 0.0 0.0 - 0.7 K/uL   Basophils Relative 0 %   Basophils Absolute 0.0 0.0 - 0.1 K/uL  Protime-INR     Status: Abnormal   Collection Time: 09/06/2015 11:20 AM  Result Value Ref Range   Prothrombin Time 25.2 (H) 11.6 - 15.2 seconds   INR 2.32 (H) 0.00 - 1.49  TSH     Status: Abnormal   Collection Time: 09/04/2015 11:20 AM  Result Value Ref Range   TSH 16.864 (H) 0.350 - 4.500 uIU/mL  I-Stat Troponin, ED - 0, 3, 6 hours (not at [facility])     Status: None   Collection Time: 08/21/2015 11:32 AM  Result Value Ref Range   Troponin i, poc 0.03 0.00 - 0.08 ng/mL   Comment 3            Comment: Due to the release kinetics of cTnI, a negative result within the first hours of the onset of symptoms does not rule out myocardial infarction with certainty. If myocardial infarction is still suspected, repeat the test at appropriate intervals.   Urinalysis, Routine w reflex microscopic (not at [facility])     Status: Abnormal   Collection Time: 09/13/2015 12:27 PM  Result Value Ref Range   Color, Urine YELLOW YELLOW   APPearance CLEAR CLEAR   Specific Gravity, Urine 1.015 1.005 - 1.030   pH 8.5 (H) 5.0 - 8.0   Glucose, UA NEGATIVE NEGATIVE mg/dL   Hgb urine dipstick TRACE (A) NEGATIVE   Bilirubin Urine NEGATIVE NEGATIVE   Ketones, ur NEGATIVE NEGATIVE mg/dL   Protein, ur 100 (A) NEGATIVE mg/dL   Urobilinogen, UA 1.0 0.0 - 1.0 mg/dL   Nitrite NEGATIVE NEGATIVE   Leukocytes, UA NEGATIVE NEGATIVE  Urine microscopic-add on     Status: Abnormal   Collection Time: 09/01/2015 12:27 PM   Result Value Ref Range   Squamous Epithelial / LPF MANY (A) RARE   WBC, UA 0-2 <3 WBC/hpf   RBC / HPF 0-2 <3 RBC/hpf   Bacteria, UA FEW (A) RARE  Glucose, capillary     Status: Abnormal   Collection Time: 09/11/2015  8:05 PM  Result Value Ref Range   Glucose-Capillary 108 (H) 65 - 99 mg/dL  Ammonia     Status: None   Collection Time: 09/11/2015  8:20 PM  Result Value Ref Range   Ammonia 26 9 - 35 umol/L  Lactate dehydrogenase     Status: Abnormal   Collection Time: 08/30/2015  8:20 PM  Result Value Ref Range   LDH 356 (H) 98 - 192 U/L  Lactic acid, plasma     Status: Abnormal   Collection Time: 09/13/2015  8:20 PM  Result Value Ref Range   Lactic Acid, Venous 2.3 (HH) 0.5 - 2.0 mmol/L    Comment: CRITICAL RESULT CALLED TO, READ BACK BY AND VERIFIED WITH: C RALPH,RN [id number] 09/05/2015 WBOND   Hepatic function panel     Status: Abnormal   Collection Time: 09/07/2015  8:20 PM  Result Value Ref Range   Total Protein 5.2 (L) 6.5 - 8.1 g/dL   Albumin 1.8 (L) 3.5 - 5.0 g/dL   AST 61 (H) 15 - 41 U/L   ALT 22 14 - 54 U/L   Alkaline Phosphatase 96 38 - 126 U/L   Total Bilirubin 0.9 0.3 - 1.2 mg/dL   Bilirubin, Direct 0.3 0.1 - 0.5 mg/dL   Indirect Bilirubin 0.6 0.3 - 0.9 mg/dL  Lipase, blood     Status: Abnormal  Collection Time: 08/29/2015  8:20 PM  Result Value Ref Range   Lipase 17 (L) 22 - 51 U/L  Procalcitonin - Baseline     Status: None   Collection Time: 08/21/2015  8:20 PM  Result Value Ref Range   Procalcitonin 52.76 ng/mL    Comment:        Interpretation: PCT >= 10 ng/mL: Important systemic inflammatory response, almost exclusively due to severe bacterial sepsis or septic shock. (NOTE)         ICU PCT Algorithm               Non ICU PCT Algorithm    ----------------------------     ------------------------------         PCT < 0.25 ng/mL                 PCT < 0.1 ng/mL     Stopping of antibiotics            Stopping of antibiotics       strongly encouraged.                strongly encouraged.    ----------------------------     ------------------------------       PCT level decrease by               PCT < 0.25 ng/mL       >= 80% from peak PCT       OR PCT 0.25 - 0.5 ng/mL          Stopping of antibiotics                                             encouraged.     Stopping of antibiotics           encouraged.    ----------------------------     ------------------------------       PCT level decrease by              PCT >= 0.25 ng/mL       < 80% from peak PCT        AND PCT >= 0.5 ng/mL             Continuing antibiotics                                              encouraged.       Continuing antibiotics            encouraged.    ----------------------------     ------------------------------     PCT level increase compared          PCT > 0.5 ng/mL         with peak PCT AND          PCT >= 0.5 ng/mL             Escalation of antibiotics                                          strongly encouraged.      Escalation of antibiotics        strongly encouraged.  Glucose, capillary     Status: Abnormal   Collection Time: 09/12/2015 11:12 PM  Result Value Ref Range   Glucose-Capillary 223 (H) 65 - 99 mg/dL  T4, free     Status: Abnormal   Collection Time: 08/19/15 12:25 AM  Result Value Ref Range   Free T4 0.52 (L) 0.61 - 1.12 ng/dL  Protime-INR     Status: Abnormal   Collection Time: 08/19/15 12:25 AM  Result Value Ref Range   Prothrombin Time 25.3 (H) 11.6 - 15.2 seconds   INR 2.33 (H) 0.00 - 1.49  Comprehensive metabolic panel     Status: Abnormal   Collection Time: 08/19/15 12:25 AM  Result Value Ref Range   Sodium 133 (L) 135 - 145 mmol/L    Comment: DELTA CHECK NOTED   Potassium 3.4 (L) 3.5 - 5.1 mmol/L    Comment: DELTA CHECK NOTED   Chloride 92 (L) 101 - 111 mmol/L   CO2 30 22 - 32 mmol/L   Glucose, Bld 218 (H) 65 - 99 mg/dL   BUN 24 (H) 6 - 20 mg/dL   Creatinine, Ser 3.82 (H) 0.44 - 1.00 mg/dL    Comment: DELTA CHECK NOTED   Calcium  7.1 (L) 8.9 - 10.3 mg/dL   Total Protein 4.5 (L) 6.5 - 8.1 g/dL   Albumin 1.6 (L) 3.5 - 5.0 g/dL   AST 53 (H) 15 - 41 U/L   ALT 22 14 - 54 U/L   Alkaline Phosphatase 81 38 - 126 U/L   Total Bilirubin 0.6 0.3 - 1.2 mg/dL   GFR calc non Af Amer 11 (L) >60 mL/min   GFR calc Af Amer 12 (L) >60 mL/min    Comment: (NOTE) The eGFR has been calculated using the CKD EPI equation. This calculation has not been validated in all clinical situations. eGFR's persistently <60 mL/min signify possible Chronic Kidney Disease.    Anion gap 11 5 - 15  CBC     Status: Abnormal   Collection Time: 08/19/15 12:25 AM  Result Value Ref Range   WBC 5.9 4.0 - 10.5 K/uL   RBC 2.73 (L) 3.87 - 5.11 MIL/uL   Hemoglobin 8.0 (L) 12.0 - 15.0 g/dL   HCT 26.5 (L) 36.0 - 46.0 %   MCV 97.1 78.0 - 100.0 fL   MCH 29.3 26.0 - 34.0 pg   MCHC 30.2 30.0 - 36.0 g/dL   RDW 16.1 (H) 11.5 - 15.5 %   Platelets 76 (L) 150 - 400 K/uL    Comment: CONSISTENT WITH PREVIOUS RESULT  Lactic acid, plasma     Status: Abnormal   Collection Time: 08/19/15 12:25 AM  Result Value Ref Range   Lactic Acid, Venous 2.2 (HH) 0.5 - 2.0 mmol/L    Comment: CRITICAL RESULT CALLED TO, READ BACK BY AND VERIFIED WITH: RALPH C,RN 08/19/15 0119 WAYK   Glucose, capillary     Status: Abnormal   Collection Time: 08/19/15  4:37 AM  Result Value Ref Range   Glucose-Capillary 162 (H) 65 - 99 mg/dL   Comment 1 Notify RN     Ct Abdomen Pelvis Wo Contrast  08/19/2015   CLINICAL DATA:  Acute onset of right lower quadrant abdominal pain for 1 day. Initial encounter.  EXAM: CT ABDOMEN AND PELVIS WITHOUT CONTRAST  TECHNIQUE: Multidetector CT imaging of the abdomen and pelvis was performed following the standard protocol without IV contrast.  COMPARISON:  Abdominal radiograph performed 01/22/2006  FINDINGS: Trace bilateral pleural effusions are noted, with  bibasilar atelectasis and scarring. Cardiomegaly is noted. Diffuse coronary artery calcifications are seen.  The patient is status post median sternotomy.  A small amount of slightly complex ascites is seen in the abdomen and pelvis, predominantly on the right side.  The liver and spleen are unremarkable in appearance. Scattered vascular calcifications are seen within the liver. The gallbladder is within normal limits. The pancreas and adrenal glands are unremarkable.  Bilateral renal atrophy is noted. Nonspecific stranding and fluid are noted about both kidneys. There is no evidence of hydronephrosis. No renal or ureteral stones are seen.  The small bowel is unremarkable in appearance. The stomach is within normal limits. No acute vascular abnormalities are seen. Scattered calcification is noted along the abdominal aorta and its branches, including along the renal arteries and superior mesenteric artery.  The appendix is not definitely seen. There is no evidence for appendicitis.  There appears to be pneumatosis along the cecum and ascending colon, of uncertain significance, with mild associated wall thickening and soft tissue inflammation. The remainder of the colon is grossly unremarkable.  The bladder is mildly distended and grossly unremarkable in appearance. Several calcified uterine fibroids are seen. The uterus is otherwise grossly unremarkable. The ovaries are relatively symmetric. No suspicious adnexal masses are seen. No inguinal lymphadenopathy is seen.  No acute osseous abnormalities are identified. Mild degenerative change is noted along the lumbar spine. There is chronic loss of height involving vertebral body L2.  IMPRESSION: 1. Apparent pneumatosis along the cecum and ascending colon at the right lower quadrant, of uncertain significance, with mild associated wall thickening and soft tissue inflammation. Though this could be benign, early changes of necrotizing enterocolitis cannot be excluded. Would correlate with the patient's symptoms. 2. Small amount of slightly complex ascites in the abdomen and  pelvis, predominantly on the right. 3. Trace bilateral pleural effusions, with bibasilar atelectasis and scarring. 4. Cardiomegaly.  Diffuse coronary artery calcifications seen. 5. Bilateral renal atrophy noted. 6. Scattered calcification along the abdominal aorta and its branches, including along the renal arteries and superior mesenteric artery. 7. Calcified uterine fibroids seen. These results were called by telephone at the time of interpretation on 08/19/2015 at 3:07 am to [facility] on [facility], who verbally acknowledged these results.   Electronically Signed   By: Garald Balding M.D.   On: 08/19/2015 03:09   Ct Head Wo Contrast  08/30/2015   CLINICAL DATA:  Altered mental status, generalized weakness for 2 days.  EXAM: CT HEAD WITHOUT CONTRAST  TECHNIQUE: Contiguous axial images were obtained from the base of the skull through the vertex without intravenous contrast.  COMPARISON:  01/24/2006  FINDINGS: Old left occipital infarct. No acute intracranial abnormality. Specifically, no hemorrhage, hydrocephalus, mass lesion, acute infarction, or significant intracranial injury. No acute calvarial abnormality. Visualized paranasal sinuses and mastoids clear. Orbital soft tissues unremarkable.  IMPRESSION: No acute intracranial abnormality.  Old left occipital infarct.   Electronically Signed   By: Rolm Baptise M.D.   On: 08/27/2015 13:00   Dg Chest Portable 1 View  09/08/2015   CLINICAL DATA:  Tachycardia.  EXAM: PORTABLE CHEST 1 VIEW  COMPARISON:  07/22/2014  FINDINGS: Prior median sternotomy and CABG. Cardiomegaly. Mild vascular congestion. No overt edema. No effusions. No acute bony abnormality.  IMPRESSION: Cardiomegaly with vascular congestion.  No overt failure.   Electronically Signed   By: Rolm Baptise M.D.   On: 09/12/2015 11:48    Review of Systems  Constitutional: Positive for malaise/fatigue.  HENT: Negative.  Eyes: Negative.   Respiratory: Negative for cough.   Cardiovascular: Negative for  chest pain.  Gastrointestinal: Positive for abdominal pain. Negative for nausea, vomiting and diarrhea.  Genitourinary:       ESRD  Musculoskeletal: Negative.   Skin: Negative.   Neurological: Negative.   Endo/Heme/Allergies: Bruises/bleeds easily.  Psychiatric/Behavioral: Negative.    Blood pressure 96/51, pulse 96, temperature 98.6 F (37 C), temperature source Oral, resp. rate 29, weight 64.3 kg (141 lb 12.1 oz), SpO2 97 %. Physical Exam  Constitutional: She appears well-developed. No distress.  HENT:  Head: Normocephalic and atraumatic.  Right Ear: External ear normal.  Left Ear: External ear normal.  Nose: Nose normal.  Mouth/Throat: Oropharynx is clear and moist.  Neck: Neck supple. No tracheal deviation present.  Cardiovascular:  Irregularly irregular, rate 100  Respiratory: Effort normal and breath sounds normal. No stridor. No respiratory distress. She has no wheezes. She has no rales.  GI: Soft. She exhibits distension. There is tenderness. There is no rebound and no guarding.  Soft with mild distention, mild right-sided tenderness with no guarding, no peritoneal signs, bowel sounds are hypoactive  Musculoskeletal:  HD fistula left upper extremity with palpable thrill  Neurological: She is alert. She exhibits normal muscle tone.  Skin: Skin is warm.  Psychiatric: She has a normal mood and affect.    Assessment/Plan: Likely ischemic colitis involving right colon - no fever and normal white count, mild tenderness. Recommend broad-spectrum IV antibiotics and IV fluid resuscitation. Agree with following up lactate this morning. We will follow along with you. If this worsens, she may need exploratory laparotomy and partial colectomy. She is not a good operative candidate overall due to her multiple severe medical problems and I did discuss this with her. Hopefully she will improve with conservative measures.  Atreyu Mak E 08/19/2015, 5:30 AM

## 2015-08-19 NOTE — Progress Notes (Signed)
PROGRESS NOTE  Megan Vasquez ZOX:[id number] DOB: February 23, 1941 DOA: 2015/09/12 PCP: [name], MD  HPI: 74 yo female patient chronic kidney disease on dialysis on Tuesday Thursday Saturday, chronic atrial fibrillation on warfarin and rate control, chronic kidney disease with anemia, mild baseline chronic thrombocytopenia (platelets are typically around 120,000), diabetes on insulin, hypertension, recent diagnosis of severe mitral regurgitation asymptomatic December 2015 per echocardiogram, hypothyroidism, dyslipidemia and known CAD. Patient was brought to the hospital today by her grandson because of altered mentation abrupt onset.According to family patient had not been eating well over the past 24 hours and this morning was not as alert as baseline. No apparent fevers or chills, no GI symptoms such as diarrhea nausea or vomiting, patient makes small amount of urine but no apparent dysuria.   10/4 CT scan on admission with concerns for pneumatosis along the cecum and ascending colon at the right lower quadrant, of uncertain significance, with mild associated wall thickening and soft tissue inflammation, ?early necrotizing enterocolitis  Subjective / 24 H Interval events - mild abdominal pain - no chest pain, shortness of breath, no nausea or vomiting.   Assessment/Plan: Principal Problem:   Atrial fibrillation with RVR (HCC) Active Problems:   Long term current use of anticoagulant therapy   Diabetes mellitus, insulin dependent (IDDM), controlled (HCC)   HTN (hypertension)   CKD (chronic kidney disease) stage V requiring chronic dialysis (HCC)   HLD (hyperlipidemia)   Coronary artery disease   Severe mitral regurgitation   RLQ abdominal pain   Thrombocytopenia (HCC)   Anemia, chronic renal failure   Hypothyroidism   Metabolic encephalopathy   Atrial fibrillation with RVR (HCC)/neck anticoagulation - Continue Cardizem infusion - Continue Metoprolol, start po Cardizem and  try to wean off gtt, with holding parameters to avoid hypotension - Pharmacy managing warfarin - CHADVASc = 6  RLQ abdominal pain due to colitis - CT abdomen with pneumatosis along the cecum and ascending colon at the right lower quadrant, unclear significance, ? Ischemic colitis. Patient is tender to palpation, no guarding or rebound tenderness.   - General surgery following, appreciate input - repeat a lactic acid in am  - continue Zosyn, clear liquid diet  Thrombocytopenia (HCC)/Anemia, chronic renal failure - Hemoglobin has decreased from a baseline of around 10-11-8.8 and uncertain if this is dilutional or indicative of bleeding - close monitoring  ESRD - nephrology following, IHD  Metabolic encephalopathy - Patient appears to return to baseline per family she is more alert today  - Consider infectious etiology-workup as above  Diabetes mellitus, insulin dependent (IDDM), controlled (HCC) - Poor oral intake so will hold Lantus insulin for now - Check CBGs and provide SSI - Check hemoglobin A1c  HTN (hypertension) - Current blood pressure well controlled - Beta blocker and calcium channel blockers as noted above in atrial fibrillation  Severe mitral regurgitation - Discovered on echocardiogram last year - Due for repeat echocardiogram December 2016 that since admitted with atrial fibrillation RVR Will go ahead and obtain an echocardiogram this admission  Hypothyroidism - TSH 16 in ER - free T4 low at 0.5 - Resume preadmission Synthroid until above labs resulted  HLD (hyperlipidemia) - Continue Pravachol  Coronary artery disease - Currently not endorsing chest pain    Diet: Diet clear liquid Room service appropriate?: Yes; Fluid consistency:: Thin Fluids: none DVT Prophylaxis: Coumadin  Code Status: Full Code Family Communication: d/w daughter and daughter in law bedside  Disposition Plan: TBD  Consultants:  Nephrology  General surgery  Procedures:  None    Antibiotics Zosyn 10/5 >>    Studies  Ct Abdomen Pelvis Wo Contrast  08/19/2015   CLINICAL DATA:  Acute onset of right lower quadrant abdominal pain for 1 day. Initial encounter.  EXAM: CT ABDOMEN AND PELVIS WITHOUT CONTRAST  TECHNIQUE: Multidetector CT imaging of the abdomen and pelvis was performed following the standard protocol without IV contrast.  COMPARISON:  Abdominal radiograph performed 01/22/2006  FINDINGS: Trace bilateral pleural effusions are noted, with bibasilar atelectasis and scarring. Cardiomegaly is noted. Diffuse coronary artery calcifications are seen. The patient is status post median sternotomy.  A small amount of slightly complex ascites is seen in the abdomen and pelvis, predominantly on the right side.  The liver and spleen are unremarkable in appearance. Scattered vascular calcifications are seen within the liver. The gallbladder is within normal limits. The pancreas and adrenal glands are unremarkable.  Bilateral renal atrophy is noted. Nonspecific stranding and fluid are noted about both kidneys. There is no evidence of hydronephrosis. No renal or ureteral stones are seen.  The small bowel is unremarkable in appearance. The stomach is within normal limits. No acute vascular abnormalities are seen. Scattered calcification is noted along the abdominal aorta and its branches, including along the renal arteries and superior mesenteric artery.  The appendix is not definitely seen. There is no evidence for appendicitis.  There appears to be pneumatosis along the cecum and ascending colon, of uncertain significance, with mild associated wall thickening and soft tissue inflammation. The remainder of the colon is grossly unremarkable.  The bladder is mildly distended and grossly unremarkable in appearance. Several calcified uterine fibroids are seen. The uterus is otherwise grossly unremarkable. The ovaries are relatively symmetric. No suspicious adnexal  masses are seen. No inguinal lymphadenopathy is seen.  No acute osseous abnormalities are identified. Mild degenerative change is noted along the lumbar spine. There is chronic loss of height involving vertebral body L2.  IMPRESSION: 1. Apparent pneumatosis along the cecum and ascending colon at the right lower quadrant, of uncertain significance, with mild associated wall thickening and soft tissue inflammation. Though this could be benign, early changes of necrotizing enterocolitis cannot be excluded. Would correlate with the patient's symptoms. 2. Small amount of slightly complex ascites in the abdomen and pelvis, predominantly on the right. 3. Trace bilateral pleural effusions, with bibasilar atelectasis and scarring. 4. Cardiomegaly.  Diffuse coronary artery calcifications seen. 5. Bilateral renal atrophy noted. 6. Scattered calcification along the abdominal aorta and its branches, including along the renal arteries and superior mesenteric artery. 7. Calcified uterine fibroids seen. These results were called by telephone at the time of interpretation on 08/19/2015 at 3:07 am to [facility] on [facility], who verbally acknowledged these results.   Electronically Signed   By: Roanna Raider M.D.   On: 08/19/2015 03:09   Ct Head Wo Contrast  2015-08-21   CLINICAL DATA:  Altered mental status, generalized weakness for 2 days.  EXAM: CT HEAD WITHOUT CONTRAST  TECHNIQUE: Contiguous axial images were obtained from the base of the skull through the vertex without intravenous contrast.  COMPARISON:  01/24/2006  FINDINGS: Old left occipital infarct. No acute intracranial abnormality. Specifically, no hemorrhage, hydrocephalus, mass lesion, acute infarction, or significant intracranial injury. No acute calvarial abnormality. Visualized paranasal sinuses and mastoids clear. Orbital soft tissues unremarkable.  IMPRESSION: No acute intracranial abnormality.  Old left occipital infarct.   Electronically Signed   By: Charlett Nose  M.D.   On: 08-21-15 13:00  Dg Chest Portable 1 View  09/14/2015   CLINICAL DATA:  Tachycardia.  EXAM: PORTABLE CHEST 1 VIEW  COMPARISON:  07/22/2014  FINDINGS: Prior median sternotomy and CABG. Cardiomegaly. Mild vascular congestion. No overt edema. No effusions. No acute bony abnormality.  IMPRESSION: Cardiomegaly with vascular congestion.  No overt failure.   Electronically Signed   By: Charlett Nose M.D.   On: 09/02/2015 11:48    Objective  Filed Vitals:   08/19/15 0552 08/19/15 0749 08/19/15 1004 08/19/15 1125  BP: 102/54 94/50 103/71 109/50  Pulse: 109 123 132 119  Temp:  99.5 F (37.5 C)  99.6 F (37.6 C)  TempSrc:  Oral  Oral  Resp: 31   37  Weight:      SpO2: 97% 95%  93%    Intake/Output Summary (Last 24 hours) at 08/19/15 1145 Last data filed at 08/31/2015 1857  Gross per 24 hour  Intake      0 ml  Output      0 ml  Net      0 ml   Filed Weights   08/31/2015 1544 08/25/2015 1952 08/19/15 0548  Weight: 64.3 kg (141 lb 12.1 oz) 64.3 kg (141 lb 12.1 oz) 64.365 kg (141 lb 14.4 oz)    Exam:  GENERAL: NAD, sleepy  HEENT: head NCAT, no scleral icterus. Pupils round and reactive. Mucous membranes are moist.   NECK: Supple.   LUNGS: Clear to auscultation. No wheezing or crackles  HEART: Irregular, tachycardic. 2+ pulses, no JVD, no peripheral edema  ABDOMEN: Soft, non-distended, tender to palpation RLQ, no guarding/rebound. Positive bowel sounds.  EXTREMITIES: Without any cyanosis, clubbing, rash, lesions or edema.  NEUROLOGIC: non focal  Data Reviewed: Basic Metabolic Panel:  Recent Labs Lab 08/24/2015 1120 08/19/15 0025  NA 140 133*  K 4.2 3.4*  CL 96* 92*  CO2 27 30  GLUCOSE 179* 218*  BUN 58* 24*  CREATININE 7.23* 3.82*  CALCIUM 7.8* 7.1*   Liver Function Tests:  Recent Labs Lab 08/30/2015 2020 08/19/15 0025  AST 61* 53*  ALT 22 22  ALKPHOS 96 81  BILITOT 0.9 0.6  PROT 5.2* 4.5*  ALBUMIN 1.8* 1.6*    Recent Labs Lab 08/30/2015 2020   LIPASE 17*    Recent Labs Lab 08/28/2015 2020  AMMONIA 26   CBC:  Recent Labs Lab 08/25/2015 1120 08/19/15 0025  WBC 9.7 5.9  NEUTROABS 8.9*  --   HGB 8.8* 8.0*  HCT 29.4* 26.5*  MCV 98.3 97.1  PLT 80* 76*   BNP (last 3 results)  Recent Labs  09/08/2015 1120  BNP 1556.0*   CBG:  Recent Labs Lab 08/15/2015 2005 09/06/2015 2312 08/19/15 0437 08/19/15 0741 08/19/15 1124  GLUCAP 108* 223* 162* 133* 184*    Recent Results (from the past 240 hour(s))  MRSA PCR Screening     Status: None   Collection Time: 08/16/2015  9:05 AM  Result Value Ref Range Status   MRSA by PCR NEGATIVE NEGATIVE Final    Comment:        The GeneXpert MRSA Assay (FDA approved for NASAL specimens only), is one component of a comprehensive MRSA colonization surveillance program. It is not intended to diagnose MRSA infection nor to guide or monitor treatment for MRSA infections.   Urine culture     Status: None (Preliminary result)   Collection Time: 08/19/2015 12:27 PM  Result Value Ref Range Status   Specimen Description URINE, CLEAN CATCH  Final   Special Requests  NONE  Final   Culture CULTURE REINCUBATED FOR BETTER GROWTH  Final   Report Status PENDING  Incomplete  Culture, blood (routine x 2)     Status: None (Preliminary result)   Collection Time: 08/23/2015  8:20 PM  Result Value Ref Range Status   Specimen Description BLOOD RIGHT WRIST  Final   Special Requests IN PEDIATRIC BOTTLE 3CC  Final   Culture NO GROWTH < 24 HOURS  Final   Report Status PENDING  Incomplete  Culture, blood (routine x 2)     Status: None (Preliminary result)   Collection Time: 08-23-15  8:30 PM  Result Value Ref Range Status   Specimen Description BLOOD RIGHT HAND  Final   Special Requests IN PEDIATRIC BOTTLE 3CC  Final   Culture NO GROWTH < 24 HOURS  Final   Report Status PENDING  Incomplete     Scheduled Meds: . calcium acetate  1,334 mg Oral TID WC  . calcium carbonate  1,250 mg Oral Q breakfast  .  darbepoetin (ARANESP) injection - DIALYSIS  150 mcg Intravenous Q Tue-HD  . doxercalciferol  2 mcg Intravenous Q T,Th,Sa-HD  . ferric gluconate (FERRLECIT/NULECIT) IV  125 mg Intravenous Q T,Th,Sa-HD  . insulin aspart  0-9 Units Subcutaneous 6 times per day  . levothyroxine  250 mcg Oral QAC breakfast  . metoprolol succinate  25 mg Oral Daily  . multivitamin  1 tablet Oral QHS  . piperacillin-tazobactam (ZOSYN)  IV  2.25 g Intravenous 3 times per day  . pneumococcal 23 valent vaccine  0.5 mL Intramuscular Tomorrow-1000  . pravastatin  40 mg Oral q1800  . sodium chloride  3 mL Intravenous Q12H  . Warfarin - Pharmacist Dosing Inpatient   Does not apply q1800   Continuous Infusions: . diltiazem (CARDIZEM) infusion 5 mg/hr (08-23-2015 2000)    Pamella Pert, MD [facility] Pager [phone]. If 7 PM - 7 AM, please contact night-coverage at [email or web address], password [facility] 08/19/2015, 11:45 AM  LOS: 1 day

## 2015-08-19 NOTE — Progress Notes (Signed)
  Echocardiogram 2D Echocardiogram has been performed.  Arvil Chaco 08/19/2015, 3:14 PM

## 2015-08-20 ENCOUNTER — Inpatient Hospital Stay (HOSPITAL_COMMUNITY): Payer: Medicare PPO

## 2015-08-20 LAB — COMPREHENSIVE METABOLIC PANEL
ALT: 26 U/L (ref 14–54)
AST: 64 U/L — AB (ref 15–41)
Albumin: 1.6 g/dL — ABNORMAL LOW (ref 3.5–5.0)
Alkaline Phosphatase: 73 U/L (ref 38–126)
Anion gap: 13 (ref 5–15)
BILIRUBIN TOTAL: 1 mg/dL (ref 0.3–1.2)
BUN: 32 mg/dL — AB (ref 6–20)
CO2: 29 mmol/L (ref 22–32)
CREATININE: 5.4 mg/dL — AB (ref 0.44–1.00)
Calcium: 8 mg/dL — ABNORMAL LOW (ref 8.9–10.3)
Chloride: 89 mmol/L — ABNORMAL LOW (ref 101–111)
GFR calc Af Amer: 8 mL/min — ABNORMAL LOW (ref >60)
GFR, EST NON AFRICAN AMERICAN: 7 mL/min — AB (ref >60)
Glucose, Bld: 112 mg/dL — ABNORMAL HIGH (ref 65–99)
POTASSIUM: 3.8 mmol/L (ref 3.5–5.1)
Sodium: 131 mmol/L — ABNORMAL LOW (ref 135–145)
TOTAL PROTEIN: 4.7 g/dL — AB (ref 6.5–8.1)

## 2015-08-20 LAB — HEMOGLOBIN A1C
HEMOGLOBIN A1C: 7.2 % — AB (ref 4.8–5.6)
Mean Plasma Glucose: 160 mg/dL

## 2015-08-20 LAB — CBC
HEMATOCRIT: 23.3 % — AB (ref 36.0–46.0)
Hemoglobin: 7.5 g/dL — ABNORMAL LOW (ref 12.0–15.0)
MCH: 30.4 pg (ref 26.0–34.0)
MCHC: 32.2 g/dL (ref 30.0–36.0)
MCV: 94.3 fL (ref 78.0–100.0)
Platelets: 86 10*3/uL — ABNORMAL LOW (ref 150–400)
RBC: 2.47 MIL/uL — ABNORMAL LOW (ref 3.87–5.11)
RDW: 16 % — AB (ref 11.5–15.5)
WBC: 10.5 10*3/uL (ref 4.0–10.5)

## 2015-08-20 LAB — GLUCOSE, CAPILLARY
GLUCOSE-CAPILLARY: 166 mg/dL — AB (ref 65–99)
GLUCOSE-CAPILLARY: 200 mg/dL — AB (ref 65–99)
GLUCOSE-CAPILLARY: 259 mg/dL — AB (ref 65–99)
Glucose-Capillary: 93 mg/dL (ref 65–99)
Glucose-Capillary: 132 mg/dL — ABNORMAL HIGH (ref 65–99)
Glucose-Capillary: 127 mg/dL — ABNORMAL HIGH (ref 65–99)

## 2015-08-20 LAB — PREPARE RBC (CROSSMATCH)

## 2015-08-20 LAB — PROTIME-INR
INR: 3.3 — ABNORMAL HIGH (ref 0.00–1.49)
PROTHROMBIN TIME: 32.9 s — AB (ref 11.6–15.2)

## 2015-08-20 LAB — LACTIC ACID, PLASMA: Lactic Acid, Venous: 1.8 mmol/L (ref 0.5–2.0)

## 2015-08-20 LAB — T3: T3, Total: 25 ng/dL — ABNORMAL LOW (ref 71–180)

## 2015-08-20 LAB — HAPTOGLOBIN: HAPTOGLOBIN: 245 mg/dL — AB (ref 34–200)

## 2015-08-20 LAB — HEMOGLOBIN AND HEMATOCRIT, BLOOD
HEMATOCRIT: 28.4 % — AB (ref 36.0–46.0)
HEMOGLOBIN: 9 g/dL — AB (ref 12.0–15.0)

## 2015-08-20 LAB — PROCALCITONIN: Procalcitonin: 35.22 ng/mL

## 2015-08-20 MED ORDER — DOXERCALCIFEROL 4 MCG/2ML IV SOLN
INTRAVENOUS | Status: AC
  Filled 2015-08-20: qty 2

## 2015-08-20 MED ORDER — SODIUM CHLORIDE 0.9 % IV BOLUS (SEPSIS)
500.0000 mL | Freq: Once | INTRAVENOUS | Status: AC
  Administered 2015-08-20: 500 mL via INTRAVENOUS

## 2015-08-20 MED ORDER — INSULIN ASPART 100 UNIT/ML ~~LOC~~ SOLN
0.0000 [IU] | Freq: Three times a day (TID) | SUBCUTANEOUS | Status: DC
  Administered 2015-08-21 (×2): 2 [IU] via SUBCUTANEOUS
  Administered 2015-08-21: 3 [IU] via SUBCUTANEOUS
  Administered 2015-08-22: 2 [IU] via SUBCUTANEOUS
  Administered 2015-08-22: 1 [IU] via SUBCUTANEOUS
  Administered 2015-08-23: 3 [IU] via SUBCUTANEOUS
  Administered 2015-08-23 (×2): 2 [IU] via SUBCUTANEOUS
  Administered 2015-08-24: 3 [IU] via SUBCUTANEOUS

## 2015-08-20 MED ORDER — SODIUM CHLORIDE 0.9 % IV SOLN
Freq: Once | INTRAVENOUS | Status: AC
  Administered 2015-08-20: 10:00:00 via INTRAVENOUS

## 2015-08-20 MED ORDER — INSULIN ASPART 100 UNIT/ML ~~LOC~~ SOLN
0.0000 [IU] | Freq: Every day | SUBCUTANEOUS | Status: DC
  Administered 2015-08-23: 4 [IU] via SUBCUTANEOUS

## 2015-08-20 NOTE — Progress Notes (Signed)
13[address] Odor37 bulatory Surgical Center LLCansenWindell Mo IslandePrudy FeelermonJules Schi960454re ADTEXTTAG>[facility]4-4091es S TEXTTAG>he Colorectal dosurIdah16o SLoleCollene [phone] [phone]  Raye Sorrow  [phone] MaresLoleta Rose77 [address] SoxBullardWika Endoscopy Center3.08Tresa MooreLennette BihariMarylene Land895284KentuckyArdath Sax873 [phone] [phone] copywriterEssex Specialized Surgical Institute Delorise RoyalsRodrigo RanFutures traderPete [phone] CookeyKentuckyDoreatha Martin  OVER THE COUNTER MEDICATION Apply 1 application topically Every Tuesday,Thursday,and Saturday with dialysis. Diabetic foot cream used for HD   Taking  . warfarin (COUMADIN) 3 MG tablet Take 1.5 to 2 tablets by mouth daily as directed by coumadin clinic (Patient not taking: Reported on Sep 10, 2015) 150 tablet 1   . warfarin (COUMADIN) 3 MG tablet TAKE 1.5 TO 2 TABLETS BY MOUTH DAILY AS DIRECTED BY COUMADIN CLINIC (Patient not taking: Reported on 09/10/15) 50 tablet 3     Assessment: 1 yoF w/ ESRD on HD presented pm 10-Sep-2015 with 2d history of lethargy, decreased appetite and AMS. On chronic coumadin for Atrial fibrillation. CHADVASc = 6.  Continues coumadin inpatient.  INR today has increased from 2.33 to 3.30 supratherapeutic.  H/o anemia, ESRD on HD qTTs.  Hgb 8.8> 8.0>7.5. PLTC = [id number]. Megan bleeding noted. On Aranesp and IV Nulecit (iron).  On clear liquid diet. MD notes Hgb has decreased from a baseline of around 10-11-8.8 and uncertain if this is dilutional or indicative of bleeding; close monitoring.    Home warfarin dose = 6 mg on Mondays, 4.5 mg all other days. Last taken PTA on 10/3 Monday.   Goal of Therapy:  INR 2-3 Monitor platelets by anticoagulation protocol: Yes   Plan:  Hold coumadin today.  Monitor platelets closely Daily INR, CBC q72h  Noah Delaine, RPh Clinical Pharmacist Pager: [phone] 08/20/2015,3:03 PM

## 2015-08-20 NOTE — Progress Notes (Signed)
[facility] Progress Note   Subjective: BP's remain soft in 90's currently . Pt lethargic  Filed Vitals:   08/19/15 1900 08/20/15 0032 08/20/15 0500 08/20/15 0823  BP:  104/56 112/48 110/57  Pulse:  104 105 101  Temp: 99.3 F (37.4 C) 98.7 F (37.1 C) 99.5 F (37.5 C)   TempSrc: Oral Oral Oral   Resp:  34 32   Weight:   67.405 kg (148 lb 9.6 oz)   SpO2:  100% 100%    Exam: Frail elderly AAF, lethargic, somnolent , difficult to get coherent responses No jvd Chest clear bilat Irreg cardiac rhythm, no mrg Abd soft , mild RLQ tenderness, +BS No LE edema L arm AVF +bruit Neuro gen'd weakness 4-/5, Ox 3, nonfocal  CXR clear  TTS NW    4 hrs 65.5kgs 2K/2Ca L AVF 1000u hep hectorol 2 venofor 100 q HD until 10/8 then 50 q week Micera 100 q2 weeks - to start 10/6      Assessment: 1 Abd pain/ pneumatosis of colon by CT - seen by surg, poss ischemic colitis; abx and IVF's for now 2 ESRD HD tts 3 Afib on MTP po and IV diltiazem 4 Hypotension - may be driving somnolence 5 Volume - looks dry 6 Hx CABG 7 Anemia Hb 7.5, cont FE/ esa  Plan - HD today, no fluid off, giving NS bolus to get BP up some. Give one unit prbc w HD today    Vinson Moselle MD  pager [phone]    cell [phone]  08/20/2015, 9:41 AM     Recent Labs Lab 09-06-2015 1120 08/19/15 0025 08/20/15 0510  NA 140 133* 131*  K 4.2 3.4* 3.8  CL 96* 92* 89*  CO2 GLUCOSE 179* 218* 112*  BUN 58* 24* 32*  CREATININE 7.23* 3.82* 5.40*  CALCIUM 7.8* 7.1* 8.0*    Recent Labs Lab 2015-09-06 2020 08/19/15 0025 08/20/15 0510  AST 61* 53* 64*  ALT ALKPHOS 96 81 73  BILITOT 0.9 0.6 1.0  PROT 5.2* 4.5* 4.7*  ALBUMIN 1.8* 1.6* 1.6*    Recent Labs Lab 09/06/2015 1120 08/19/15 0025 08/20/15 0510  WBC 9.7 5.9 10.5  NEUTROABS 8.9*  --   --   HGB 8.8* 8.0* 7.5*  HCT 29.4* 26.5* 23.3*  MCV 98.3 97.1 94.3  PLT 80* 76* 86*   . calcium acetate  1,334 mg Oral TID WC   . calcium carbonate  1,250 mg Oral Q breakfast  . darbepoetin (ARANESP) injection - DIALYSIS  150 mcg Intravenous Q Tue-HD  . diltiazem  30 mg Oral 4 times per day  . doxercalciferol  2 mcg Intravenous Q T,Th,Sa-HD  . feeding supplement  1 Container Oral TID BM  . ferric gluconate (FERRLECIT/NULECIT) IV  125 mg Intravenous Q T,Th,Sa-HD  . insulin aspart  0-9 Units Subcutaneous 6 times per day  . levothyroxine  250 mcg Oral QAC breakfast  . metoprolol succinate  25 mg Oral Daily  . multivitamin  1 tablet Oral QHS  . piperacillin-tazobactam (ZOSYN)  IV  2.25 g Intravenous 3 times per day  . pneumococcal 23 valent vaccine  0.5 mL Intramuscular Tomorrow-1000  . pravastatin  40 mg Oral q1800  . sodium chloride  3 mL Intravenous Q12H  . Warfarin - Pharmacist Dosing Inpatient   Does not apply q1800   . diltiazem (CARDIZEM) infusion 5 mg/hr (09/06/15 2000)   sodium chloride, acetaminophen **OR** acetaminophen, morphine injection, ondansetron **  OR** ondansetron (ZOFRAN) IV, sodium chloride

## 2015-08-20 NOTE — Progress Notes (Signed)
[facility] Progress Note     Subjective: Pt denies any abdominal pain.  No N/V, tolerating clears.  Looks comfortable in bed.  Said she had a BM yesterday, but none recorded.    Objective: Vital signs in last 24 hours: Temp:  [98.7 F (37.1 C)-100.6 F (38.1 C)] 99.5 F (37.5 C) (10/06 0500) Pulse Rate:  [104-132] 105 (10/06 0500) Resp:  [32-37] 32 (10/06 0500) BP: (94-117)/(48-71) 112/48 mmHg (10/06 0500) SpO2:  [93 %-100 %] 100 % (10/06 0500) Weight:  [67.405 kg (148 lb 9.6 oz)] 67.405 kg (148 lb 9.6 oz) (10/06 0500) Last BM Date: 08/19/15  Intake/Output from previous day: 10/05 0701 - 10/06 0700 In: 360 [P.O.:360] Out: -  Intake/Output this shift:    PE: Gen:  Alert, NAD, pleasant Card:  AFIB, tachy, no M/G/R heard Pulm:  CTA, no W/R/R, good effort, Villanueva O2 on Abd: Soft, distended, NT, +BS, no HSM   Lab Results:   Recent Labs  08/19/15 0025 08/20/15 0510  WBC 5.9 10.5  HGB 8.0* 7.5*  HCT 26.5* 23.3*  PLT 76* 86*   BMET  Recent Labs  08/19/15 0025 08/20/15 0510  NA 133* 131*  K 3.4* 3.8  CL 92* 89*  CO2 30 29  GLUCOSE 218* 112*  BUN 24* 32*  CREATININE 3.82* 5.40*  CALCIUM 7.1* 8.0*   PT/INR  Recent Labs  08/19/15 0025 08/20/15 0510  LABPROT 25.3* 32.9*  INR 2.33* 3.30*   CMP     Component Value Date/Time   NA 131* 08/20/2015 0510   K 3.8 08/20/2015 0510   CL 89* 08/20/2015 0510   CO2 29 08/20/2015 0510   GLUCOSE 112* 08/20/2015 0510   BUN 32* 08/20/2015 0510   CREATININE 5.40* 08/20/2015 0510   CALCIUM 8.0* 08/20/2015 0510   CALCIUM 8.4 02/24/2010 1106   PROT 4.7* 08/20/2015 0510   ALBUMIN 1.6* 08/20/2015 0510   AST 64* 08/20/2015 0510   ALT 26 08/20/2015 0510   ALKPHOS 73 08/20/2015 0510   BILITOT 1.0 08/20/2015 0510   GFRNONAA 7* 08/20/2015 0510   GFRAA 8* 08/20/2015 0510   Lipase     Component Value Date/Time   LIPASE 17* 09/15/15 2020       Studies/Results: Ct Abdomen Pelvis Wo  Contrast  08/19/2015   CLINICAL DATA:  Acute onset of right lower quadrant abdominal pain for 1 day. Initial encounter.  EXAM: CT ABDOMEN AND PELVIS WITHOUT CONTRAST  TECHNIQUE: Multidetector CT imaging of the abdomen and pelvis was performed following the standard protocol without IV contrast.  COMPARISON:  Abdominal radiograph performed 01/22/2006  FINDINGS: Trace bilateral pleural effusions are noted, with bibasilar atelectasis and scarring. Cardiomegaly is noted. Diffuse coronary artery calcifications are seen. The patient is status post median sternotomy.  A small amount of slightly complex ascites is seen in the abdomen and pelvis, predominantly on the right side.  The liver and spleen are unremarkable in appearance. Scattered vascular calcifications are seen within the liver. The gallbladder is within normal limits. The pancreas and adrenal glands are unremarkable.  Bilateral renal atrophy is noted. Nonspecific stranding and fluid are noted about both kidneys. There is no evidence of hydronephrosis. No renal or ureteral stones are seen.  The small bowel is unremarkable in appearance. The stomach is within normal limits. No acute vascular abnormalities are seen. Scattered calcification is noted along the abdominal aorta and its branches, including along the renal arteries and superior mesenteric artery.  The appendix is not definitely seen.  There is no evidence for appendicitis.  There appears to be pneumatosis along the cecum and ascending colon, of uncertain significance, with mild associated wall thickening and soft tissue inflammation. The remainder of the colon is grossly unremarkable.  The bladder is mildly distended and grossly unremarkable in appearance. Several calcified uterine fibroids are seen. The uterus is otherwise grossly unremarkable. The ovaries are relatively symmetric. No suspicious adnexal masses are seen. No inguinal lymphadenopathy is seen.  No acute osseous abnormalities are  identified. Mild degenerative change is noted along the lumbar spine. There is chronic loss of height involving vertebral body L2.  IMPRESSION: 1. Apparent pneumatosis along the cecum and ascending colon at the right lower quadrant, of uncertain significance, with mild associated wall thickening and soft tissue inflammation. Though this could be benign, early changes of necrotizing enterocolitis cannot be excluded. Would correlate with the patient's symptoms. 2. Small amount of slightly complex ascites in the abdomen and pelvis, predominantly on the right. 3. Trace bilateral pleural effusions, with bibasilar atelectasis and scarring. 4. Cardiomegaly.  Diffuse coronary artery calcifications seen. 5. Bilateral renal atrophy noted. 6. Scattered calcification along the abdominal aorta and its branches, including along the renal arteries and superior mesenteric artery. 7. Calcified uterine fibroids seen. These results were called by telephone at the time of interpretation on 08/19/2015 at 3:07 am to [facility] on [facility], who verbally acknowledged these results.   Electronically Signed   By: Roanna Raider M.D.   On: 08/19/2015 03:09   Ct Head Wo Contrast  09/08/2015   CLINICAL DATA:  Altered mental status, generalized weakness for 2 days.  EXAM: CT HEAD WITHOUT CONTRAST  TECHNIQUE: Contiguous axial images were obtained from the base of the skull through the vertex without intravenous contrast.  COMPARISON:  01/24/2006  FINDINGS: Old left occipital infarct. No acute intracranial abnormality. Specifically, no hemorrhage, hydrocephalus, mass lesion, acute infarction, or significant intracranial injury. No acute calvarial abnormality. Visualized paranasal sinuses and mastoids clear. Orbital soft tissues unremarkable.  IMPRESSION: No acute intracranial abnormality.  Old left occipital infarct.   Electronically Signed   By: Charlett Nose M.D.   On: 08/19/2015 13:00   Dg Chest Portable 1 View  09/12/2015   CLINICAL DATA:   Tachycardia.  EXAM: PORTABLE CHEST 1 VIEW  COMPARISON:  07/22/2014  FINDINGS: Prior median sternotomy and CABG. Cardiomegaly. Mild vascular congestion. No overt edema. No effusions. No acute bony abnormality.  IMPRESSION: Cardiomegaly with vascular congestion.  No overt failure.   Electronically Signed   By: Charlett Nose M.D.   On: 08/24/2015 11:48    Anti-infectives: Anti-infectives    Start     Dose/Rate Route Frequency Ordered Stop   08/19/15 0600  piperacillin-tazobactam (ZOSYN) IVPB 2.25 g    Comments:  Zosyn 2.25 g IV q8h in ESRD   2.25 g 100 mL/hr over 30 Minutes Intravenous 3 times per day 08/19/15 0539         Assessment/Plan Acute abdominal pain - Likely ischemic colitis involving right colon -Low grade fevers only and and normal white count, denies pain/tenderness, some distension, in AFIB, tachypnea -Continue conservative management.  Broad-spectrum IV antibiotics.  Lactic acid has cleared 1.8. -INR is 3.30, Cr. 5.40, Hgb 7.5, on coumadin, this should be held in case she were to need surgery, can be on heparin -If this worsens, she may need exploratory laparotomy and partial colectomy. She is not a good operative candidate overall due to her multiple severe medical problems and I did discuss this  with her. Hopefully she will improve with conservative measures. -Echo showed EF 60-65%    LOS: 2 days    Nonie Hoyer 08/20/2015, 7:30 AM Pager: [phone]

## 2015-08-20 NOTE — Progress Notes (Signed)
Telephone conversation with [name], patient's daughter at approximately 20:50. Per her report, the patient has been complaining of mouth pain related to broken teeth and also lower extremity pain (particularly on the right).  The mouth pain had not previously been reported to nursing. On assessment, the patient has poor overall oral hygiene and several missing teeth (upper and lower). However, the patient is currently complaint free.  The lower extremity pain was a known issue from earlier today and was addressed by the rounding MD. X-ray of foot was done this evening and was negative for acute fracture; does show osteopenia. Currently, INR is supratherapeutic and coumadin was held this evening.  Patient apparently sees [name] (vascular specialist) related to PAD. Family requested that [name] be made aware of the patient's hospital admission and leg pain, and that someone be consulted to see the patient regarding her mouth pain.  Updated family on patient's condition at the time of her call.  Noted communication here for MD follow-up on AM rounds. Will communicate to oncoming shift also.

## 2015-08-20 NOTE — Progress Notes (Signed)
PROGRESS NOTE  Megan Vasquez ZOX:[id number] DOB: 05-06-1941 DOA: September 14, 2015 PCP: [name], MD  HPI: 74 yo female patient chronic kidney disease on dialysis on Tuesday Thursday Saturday, chronic atrial fibrillation on warfarin and rate control, chronic kidney disease with anemia, mild baseline chronic thrombocytopenia (platelets are typically around 120,000), diabetes on insulin, hypertension, recent diagnosis of severe mitral regurgitation asymptomatic December 2015 per echocardiogram, hypothyroidism, dyslipidemia and known CAD. Patient was brought to the hospital today by her grandson because of altered mentation abrupt onset.According to family patient had not been eating well over the past 24 hours and this morning was not as alert as baseline. No apparent fevers or chills, no GI symptoms such as diarrhea nausea or vomiting, patient makes small amount of urine but no apparent dysuria.   10/4 CT scan on admission with concerns for pneumatosis along the cecum and ascending colon at the right lower quadrant, of uncertain significance, with mild associated wall thickening and soft tissue inflammation, ?early necrotizing enterocolitis  Subjective / 24 H Interval events - abdominal pain improving, eating well - denies nausea/vomiting  Assessment/Plan: Principal Problem:   Atrial fibrillation with RVR (HCC) Active Problems:   Long term current use of anticoagulant therapy   Diabetes mellitus, insulin dependent (IDDM), controlled (HCC)   HTN (hypertension)   CKD (chronic kidney disease) stage V requiring chronic dialysis (HCC)   HLD (hyperlipidemia)   Coronary artery disease   Severe mitral regurgitation   RLQ abdominal pain   Thrombocytopenia (HCC)   Anemia, chronic renal failure   Hypothyroidism   Metabolic encephalopathy   Atrial fibrillation with RVR (HCC)/on anticoagulation - rates better, stop Cardizem infusion continue po only.  - Continue Metoprolol - Pharmacy  managing warfarin - CHADVASc = 6  RLQ abdominal pain due to colitis - CT abdomen with pneumatosis along the cecum and ascending colon at the right lower quadrant, unclear significance, ? Ischemic colitis. Patient is tender to palpation, no guarding or rebound tenderness.   - General surgery following, appreciate input - continue Zosyn, clear liquid diet  Thrombocytopenia (HCC)/Anemia, chronic renal failure - no bleeding, transfuse with HD today  - close monitoring  ESRD - nephrology following, HD today  Metabolic encephalopathy - Patient appears to return to baseline per family she is more alert - Consider infectious etiology-workup as above  Diabetes mellitus, insulin dependent (IDDM), controlled (HCC) - Poor oral intake so will hold Lantus insulin for now - Check CBGs and provide SSI - A1c at 7.2  HTN (hypertension) - Current blood pressure well controlled - Beta blocker and calcium channel blockers as noted above in atrial fibrillation  Severe mitral regurgitation - Discovered on echocardiogram last year - Due for repeat echocardiogram December 2016 that since admitted with atrial fibrillation RVR Will go ahead and obtain an echocardiogram this admission  Hypothyroidism - TSH 16 in ER - free T4 low at 0.5 - Resume preadmission Synthroid, will not increase dose now with A fib with RVR  HLD (hyperlipidemia) - Continue Pravachol  Coronary artery disease - Currently not endorsing chest pain    Diet: Diet clear liquid Room service appropriate?: Yes; Fluid consistency:: Thin Fluids: none DVT Prophylaxis: Coumadin  Code Status: Full Code Family Communication: d/w daughter and daughter in law bedside  Disposition Plan: TBD  Consultants:  Nephrology  General surgery   Procedures:  None    Antibiotics Zosyn 10/5 >>    Studies  Ct Abdomen Pelvis Wo Contrast  08/19/2015   CLINICAL DATA:  Acute onset  of right lower quadrant abdominal pain for 1 day.  Initial encounter.  EXAM: CT ABDOMEN AND PELVIS WITHOUT CONTRAST  TECHNIQUE: Multidetector CT imaging of the abdomen and pelvis was performed following the standard protocol without IV contrast.  COMPARISON:  Abdominal radiograph performed 01/22/2006  FINDINGS: Trace bilateral pleural effusions are noted, with bibasilar atelectasis and scarring. Cardiomegaly is noted. Diffuse coronary artery calcifications are seen. The patient is status post median sternotomy.  A small amount of slightly complex ascites is seen in the abdomen and pelvis, predominantly on the right side.  The liver and spleen are unremarkable in appearance. Scattered vascular calcifications are seen within the liver. The gallbladder is within normal limits. The pancreas and adrenal glands are unremarkable.  Bilateral renal atrophy is noted. Nonspecific stranding and fluid are noted about both kidneys. There is no evidence of hydronephrosis. No renal or ureteral stones are seen.  The small bowel is unremarkable in appearance. The stomach is within normal limits. No acute vascular abnormalities are seen. Scattered calcification is noted along the abdominal aorta and its branches, including along the renal arteries and superior mesenteric artery.  The appendix is not definitely seen. There is no evidence for appendicitis.  There appears to be pneumatosis along the cecum and ascending colon, of uncertain significance, with mild associated wall thickening and soft tissue inflammation. The remainder of the colon is grossly unremarkable.  The bladder is mildly distended and grossly unremarkable in appearance. Several calcified uterine fibroids are seen. The uterus is otherwise grossly unremarkable. The ovaries are relatively symmetric. No suspicious adnexal masses are seen. No inguinal lymphadenopathy is seen.  No acute osseous abnormalities are identified. Mild degenerative change is noted along the lumbar spine. There is chronic loss of height involving  vertebral body L2.  IMPRESSION: 1. Apparent pneumatosis along the cecum and ascending colon at the right lower quadrant, of uncertain significance, with mild associated wall thickening and soft tissue inflammation. Though this could be benign, early changes of necrotizing enterocolitis cannot be excluded. Would correlate with the patient's symptoms. 2. Small amount of slightly complex ascites in the abdomen and pelvis, predominantly on the right. 3. Trace bilateral pleural effusions, with bibasilar atelectasis and scarring. 4. Cardiomegaly.  Diffuse coronary artery calcifications seen. 5. Bilateral renal atrophy noted. 6. Scattered calcification along the abdominal aorta and its branches, including along the renal arteries and superior mesenteric artery. 7. Calcified uterine fibroids seen. These results were called by telephone at the time of interpretation on 08/19/2015 at 3:07 am to [facility] on [facility], who verbally acknowledged these results.   Electronically Signed   By: Roanna Raider M.D.   On: 08/19/2015 03:09   Ct Head Wo Contrast  2015/09/14   CLINICAL DATA:  Altered mental status, generalized weakness for 2 days.  EXAM: CT HEAD WITHOUT CONTRAST  TECHNIQUE: Contiguous axial images were obtained from the base of the skull through the vertex without intravenous contrast.  COMPARISON:  01/24/2006  FINDINGS: Old left occipital infarct. No acute intracranial abnormality. Specifically, no hemorrhage, hydrocephalus, mass lesion, acute infarction, or significant intracranial injury. No acute calvarial abnormality. Visualized paranasal sinuses and mastoids clear. Orbital soft tissues unremarkable.  IMPRESSION: No acute intracranial abnormality.  Old left occipital infarct.   Electronically Signed   By: Charlett Nose M.D.   On: 09/14/2015 13:00    Objective  Filed Vitals:   08/20/15 0823 08/20/15 0855 08/20/15 0956 08/20/15 1101  BP: 110/57 91/71 112/43 114/47  Pulse: 101     Temp:  TempSrc:      Resp:   32 39 37  Weight:      SpO2:        Intake/Output Summary (Last 24 hours) at 08/20/15 1247 Last data filed at 08/20/15 0935  Gross per 24 hour  Intake    690 ml  Output      0 ml  Net    690 ml   Filed Weights   Sep 06, 2015 1952 08/19/15 0548 08/20/15 0500  Weight: 64.3 kg (141 lb 12.1 oz) 64.365 kg (141 lb 14.4 oz) 67.405 kg (148 lb 9.6 oz)    Exam:  GENERAL: NAD, sleepy  HEENT: head NCAT, no scleral icterus. Pupils round and reactive. Mucous membranes are moist.   NECK: Supple.   LUNGS: Clear to auscultation. No wheezing or crackles  HEART: Irregular, tachycardic. 2+ pulses, no JVD, no peripheral edema  ABDOMEN: Soft, non-distended, tender to palpation RLQ, no guarding/rebound. Positive bowel sounds.  EXTREMITIES: Without any cyanosis, clubbing, rash, lesions or edema.  NEUROLOGIC: non focal  Data Reviewed: Basic Metabolic Panel:  Recent Labs Lab September 06, 2015 1120 08/19/15 0025 08/20/15 0510  NA 140 133* 131*  K 4.2 3.4* 3.8  CL 96* 92* 89*  CO2 27 30 29   GLUCOSE 179* 218* 112*  BUN 58* 24* 32*  CREATININE 7.23* 3.82* 5.40*  CALCIUM 7.8* 7.1* 8.0*   Liver Function Tests:  Recent Labs Lab 2015-09-06 2020 08/19/15 0025 08/20/15 0510  AST 61* 53* 64*  ALT 22 22 26   ALKPHOS 96 81 73  BILITOT 0.9 0.6 1.0  PROT 5.2* 4.5* 4.7*  ALBUMIN 1.8* 1.6* 1.6*    Recent Labs Lab 09-06-2015 2020  LIPASE 17*    Recent Labs Lab 09/06/15 2020  AMMONIA 26   CBC:  Recent Labs Lab 09/06/2015 1120 08/19/15 0025 08/20/15 0510  WBC 9.7 5.9 10.5  NEUTROABS 8.9*  --   --   HGB 8.8* 8.0* 7.5*  HCT 29.4* 26.5* 23.3*  MCV 98.3 97.1 94.3  PLT 80* 76* 86*   BNP (last 3 results)  Recent Labs  09/06/15 1120  BNP 1556.0*   CBG:  Recent Labs Lab 08/19/15 1949 08/20/15 0038 08/20/15 0351 08/20/15 0731 08/20/15 1115  GLUCAP 147* 166* 93 132* 259*    Recent Results (from the past 240 hour(s))  MRSA PCR Screening     Status: None   Collection Time:  09/06/15  9:05 AM  Result Value Ref Range Status   MRSA by PCR NEGATIVE NEGATIVE Final    Comment:        The GeneXpert MRSA Assay (FDA approved for NASAL specimens only), is one component of a comprehensive MRSA colonization surveillance program. It is not intended to diagnose MRSA infection nor to guide or monitor treatment for MRSA infections.   Urine culture     Status: None   Collection Time: 2015-09-06 12:27 PM  Result Value Ref Range Status   Specimen Description URINE, CLEAN CATCH  Final   Special Requests NONE  Final   Culture MULTIPLE SPECIES PRESENT, SUGGEST RECOLLECTION  Final   Report Status 08/19/2015 FINAL  Final  Culture, blood (routine x 2)     Status: None (Preliminary result)   Collection Time: 09/06/15  8:20 PM  Result Value Ref Range Status   Specimen Description BLOOD RIGHT WRIST  Final   Special Requests IN PEDIATRIC BOTTLE 3CC  Final   Culture NO GROWTH 2 DAYS  Final   Report Status PENDING  Incomplete  Culture, blood (routine  x 2)     Status: None (Preliminary result)   Collection Time: 2015-09-02  8:30 PM  Result Value Ref Range Status   Specimen Description BLOOD RIGHT HAND  Final   Special Requests IN PEDIATRIC BOTTLE 3CC  Final   Culture NO GROWTH 2 DAYS  Final   Report Status PENDING  Incomplete     Scheduled Meds: . calcium acetate  1,334 mg Oral TID WC  . calcium carbonate  1,250 mg Oral Q breakfast  . darbepoetin (ARANESP) injection - DIALYSIS  150 mcg Intravenous Q Tue-HD  . diltiazem  30 mg Oral 4 times per day  . doxercalciferol  2 mcg Intravenous Q T,Th,Sa-HD  . feeding supplement  1 Container Oral TID BM  . ferric gluconate (FERRLECIT/NULECIT) IV  125 mg Intravenous Q T,Th,Sa-HD  . insulin aspart  0-9 Units Subcutaneous 6 times per day  . levothyroxine  250 mcg Oral QAC breakfast  . metoprolol succinate  25 mg Oral Daily  . multivitamin  1 tablet Oral QHS  . piperacillin-tazobactam (ZOSYN)  IV  2.25 g Intravenous 3 times per day  .  pneumococcal 23 valent vaccine  0.5 mL Intramuscular Tomorrow-1000  . pravastatin  40 mg Oral q1800  . sodium chloride  3 mL Intravenous Q12H  . Warfarin - Pharmacist Dosing Inpatient   Does not apply q1800   Continuous Infusions: . diltiazem (CARDIZEM) infusion Stopped (08/20/15 0955)    Pamella Pert, MD [facility] Pager [phone]. If 7 PM - 7 AM, please contact night-coverage at [email or web address], password [facility] 08/20/2015, 12:47 PM  LOS: 2 days

## 2015-08-21 ENCOUNTER — Inpatient Hospital Stay (HOSPITAL_COMMUNITY): Payer: Medicare PPO

## 2015-08-21 DIAGNOSIS — M79671 Pain in right foot: Secondary | ICD-10-CM

## 2015-08-21 DIAGNOSIS — N185 Chronic kidney disease, stage 5: Secondary | ICD-10-CM

## 2015-08-21 DIAGNOSIS — I739 Peripheral vascular disease, unspecified: Secondary | ICD-10-CM

## 2015-08-21 DIAGNOSIS — I340 Nonrheumatic mitral (valve) insufficiency: Secondary | ICD-10-CM

## 2015-08-21 DIAGNOSIS — D72829 Elevated white blood cell count, unspecified: Secondary | ICD-10-CM

## 2015-08-21 DIAGNOSIS — A419 Sepsis, unspecified organism: Secondary | ICD-10-CM | POA: Diagnosis present

## 2015-08-21 DIAGNOSIS — I10 Essential (primary) hypertension: Secondary | ICD-10-CM

## 2015-08-21 DIAGNOSIS — R509 Fever, unspecified: Secondary | ICD-10-CM

## 2015-08-21 DIAGNOSIS — E785 Hyperlipidemia, unspecified: Secondary | ICD-10-CM

## 2015-08-21 LAB — BASIC METABOLIC PANEL
Anion gap: 15 (ref 5–15)
BUN: 16 mg/dL (ref 6–20)
CHLORIDE: 96 mmol/L — AB (ref 101–111)
CO2: 25 mmol/L (ref 22–32)
Calcium: 8.1 mg/dL — ABNORMAL LOW (ref 8.9–10.3)
Creatinine, Ser: 3.73 mg/dL — ABNORMAL HIGH (ref 0.44–1.00)
GFR calc Af Amer: 13 mL/min — ABNORMAL LOW (ref >60)
GFR calc non Af Amer: 11 mL/min — ABNORMAL LOW (ref >60)
GLUCOSE: 210 mg/dL — AB (ref 65–99)
POTASSIUM: 4.3 mmol/L (ref 3.5–5.1)
Sodium: 136 mmol/L (ref 135–145)

## 2015-08-21 LAB — BLOOD GAS, ARTERIAL
ACID-BASE EXCESS: 5.8 mmol/L — AB (ref 0.0–2.0)
Bicarbonate: 29.1 mEq/L — ABNORMAL HIGH (ref 20.0–24.0)
O2 CONTENT: 2 L/min
O2 Saturation: 92.9 %
PATIENT TEMPERATURE: 98.6
PCO2 ART: 36.8 mmHg (ref 35.0–45.0)
PO2 ART: 76.7 mmHg — AB (ref 80.0–100.0)
TCO2: 30.2 mmol/L (ref 0–100)
pH, Arterial: 7.509 — ABNORMAL HIGH (ref 7.350–7.450)

## 2015-08-21 LAB — TYPE AND SCREEN
ABO/RH(D): A POS
Antibody Screen: NEGATIVE
UNIT DIVISION: 0

## 2015-08-21 LAB — GLUCOSE, CAPILLARY
GLUCOSE-CAPILLARY: 207 mg/dL — AB (ref 65–99)
GLUCOSE-CAPILLARY: 216 mg/dL — AB (ref 65–99)
Glucose-Capillary: 155 mg/dL — ABNORMAL HIGH (ref 65–99)
Glucose-Capillary: 186 mg/dL — ABNORMAL HIGH (ref 65–99)
Glucose-Capillary: 192 mg/dL — ABNORMAL HIGH (ref 65–99)

## 2015-08-21 LAB — CBC
HEMATOCRIT: 29.1 % — AB (ref 36.0–46.0)
Hemoglobin: 9.3 g/dL — ABNORMAL LOW (ref 12.0–15.0)
MCH: 30.1 pg (ref 26.0–34.0)
MCHC: 32 g/dL (ref 30.0–36.0)
MCV: 94.2 fL (ref 78.0–100.0)
Platelets: 94 10*3/uL — ABNORMAL LOW (ref 150–400)
RBC: 3.09 MIL/uL — ABNORMAL LOW (ref 3.87–5.11)
RDW: 17 % — AB (ref 11.5–15.5)
WBC: 14.3 10*3/uL — AB (ref 4.0–10.5)

## 2015-08-21 LAB — PROTIME-INR
INR: 3.33 — ABNORMAL HIGH (ref 0.00–1.49)
Prothrombin Time: 33.1 seconds — ABNORMAL HIGH (ref 11.6–15.2)

## 2015-08-21 MED ORDER — VANCOMYCIN HCL 10 G IV SOLR
1500.0000 mg | Freq: Once | INTRAVENOUS | Status: AC
  Administered 2015-08-21: 1500 mg via INTRAVENOUS
  Filled 2015-08-21: qty 1500

## 2015-08-21 MED ORDER — VANCOMYCIN HCL IN DEXTROSE 750-5 MG/150ML-% IV SOLN
750.0000 mg | INTRAVENOUS | Status: DC
  Administered 2015-08-22: 750 mg via INTRAVENOUS
  Filled 2015-08-21 (×2): qty 150

## 2015-08-21 MED ORDER — ACETAMINOPHEN 650 MG RE SUPP: 650.0000 mg | RECTAL | Status: DC | PRN

## 2015-08-21 NOTE — Consult Note (Signed)
CARDIOLOGY CONSULT NOTE   Patient ID: Megan Vasquez MRN: [id number], DOB/AGE: February 17, 1941   Admit date: September 08, 2015 Date of Consult: 08/21/2015   Primary Physician: [name], MD Primary Cardiologist: Dr. Allyson Sabal  Pt. Profile  chronically ill-appearing 74 year old African-American female with PMH of HTN, HLD, DM, permanent afib on coumadin, hypothyroidism, ESRD on HD TTS, CVA, severe MR and h/o CAD s/p CABG (LIMA to LAD, SVG to diag, SVG to OM and SVG to distal RCA) by Dr. Laneta Simmers 01/2006 presented with AMS  Problem List  Past Medical History  Diagnosis Date  . Diabetes mellitus without complication (HCC)   . Hypertension   . Hyperlipidemia   . Atrial fibrillation (HCC)   . ESRD (end stage renal disease) (HCC)     HD since 06/2000  . CAD (coronary artery disease)   . 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 History of nuclear stress test 10/2012    lexiscan; normal stress test; post-stress EF72%  . Peripheral arterial disease (HCC)   . Osteoporosis   . Mitral regurgitation     severe by 2-D echocardiogram performed 10/22/14    Past Surgical History  Procedure Laterality Date  . Av fistula placement  2007    L brachial cephalic   . Transthoracic echocardiogram  12/2010    EF=>55%, mod conc LVH; mild mitral annular calcif; mild MR; trace TR; trace PV regurg  . Lower extremity arterial doppler  10/02/2013    bilat ABIs could not be obtained (occluded vessels); bilat TBIs - no flow visualized; R mid SFA 70-99% diameter reduction; bilat runoff with posterior tibial, anterior tibial, peroneal arteries with occlusive disease   . Cardiac catheterization  12/19/2005    3 vessel disease with normal LV function, positive Cardiolite --> subsequent CABG (Dr. [name])  . Coronary artery bypass graft  01/18/2006    LIMA to LAD, vein to diagonal, vein to  marginal branch of Cfx, vein to distal RCA (Dr. Wayland Salinas)  . Carotid doppler  02/24/2009    R bulb 0-49% diameter refuction, R & L ICAs with significatn tortuosity (source of bruit?)   . Abdominal aortagram N/A 03/24/2014    Procedure: ABDOMINAL Ronny Flurry;  Surgeon: Chuck Hint, MD;  Location: [facility] CATH LAB;  Service: Cardiovascular;  Laterality: N/A;     Allergies  No Known Allergies  HPI   The patient is a chronically ill-appearing 74 year old African-American female with PMH of HTN, HLD, DM, permanent afib on coumadin, hypothyroidism, ESRD on HD TTS, CVA, severe MR and h/o CAD s/p CABG (LIMA to LAD, SVG to diag, SVG to OM and SVG to distal RCA) by Dr. Laneta Simmers 01/2006. Patient does not know when is the last time she is in sinus rhythm, however she appears to have been in atrial fibrillation for at least the past year. She had a negative Myoview in 2013. She has not had another cardiac catheterization since her bypass surgery. She had echocardiogram in December 2015 which noticed severe MR, however normal EF. According to the patient, she lives with her grandson. However because of her current mental status, she is unable to tell me much about anything else.  She presented to the hospital on Sep 08, 2015 with altered mental status and found to be atrial fibrillation with RVR and also tachypnea in the 30s. Lactic acid was initially 2.2. Hemoglobin 8.0  down from 11.9 one year ago. Her free T4 and T3 were both low. INR appears to be therapeutic at 2.3. She was admitted to the internal medicine service. CT of abdomen showed apparent pneumatosis along the cecum and ascending colon at the right lower quadrant, small amount of complex ascites in the abdomen predominantly on the right. General surgery was consulted and the felt to pneumatosis likely related to ischemic colitis. However due to patient's complex medical history, wanted to manage this conservatively for now to see if she will recover. She was  initially placed on IV diltiazem along with home Toprol-XL for control heart rate, eventually IV diltiazem was switched to a 30 mg every 6 hours of short acting diltiazem. Vascular surgery was also consulted for the leg pain, based on her previous arteriogram, she did not have any endovascular option for revascularization. During this admission, she had fever and elevated white count. MAXIMUM TEMPERATURE 101.1 this morning. Abdominal x-ray obtained on 08/21/2015 continues to show pneumatosis in the right colon, also questionable ileus versus enteritis. Chest x-ray showed evidence of a degree of congestive heart failure, however suspect superimposed pneumonia near the bases particularly on the left. Cardiology has been consulted for atrial fibrillation and in case she needed preoperative clearance. Of note patient is still currently on Coumadin with INR of 3.3.   Inpatient Medications  . calcium acetate  1,334 mg Oral TID WC  . calcium carbonate  1,250 mg Oral Q breakfast  . darbepoetin (ARANESP) injection - DIALYSIS  150 mcg Intravenous Q Tue-HD  . diltiazem  30 mg Oral 4 times per day  . doxercalciferol  2 mcg Intravenous Q T,Th,Sa-HD  . feeding supplement  1 Container Oral TID BM  . ferric gluconate (FERRLECIT/NULECIT) IV  125 mg Intravenous Q T,Th,Sa-HD  . insulin aspart  0-5 Units Subcutaneous QHS  . insulin aspart  0-9 Units Subcutaneous TID WC  . levothyroxine  250 mcg Oral QAC breakfast  . metoprolol succinate  25 mg Oral Daily  . multivitamin  1 tablet Oral QHS  . piperacillin-tazobactam (ZOSYN)  IV  2.25 g Intravenous 3 times per day  . pneumococcal 23 valent vaccine  0.5 mL Intramuscular Tomorrow-1000  . pravastatin  40 mg Oral q1800  . sodium chloride  3 mL Intravenous Q12H  . [START ON 08/22/2015] vancomycin  750 mg Intravenous Q T,Th,Sa-HD  . Warfarin - Pharmacist Dosing Inpatient   Does not apply q1800    Family History Family History  Problem Relation Age of Onset  . Heart  disease Mother   . Heart attack Brother   . Breast cancer Sister   . Diabetes Sister   . Heart attack Sister   . Diabetes Child      Social History Social History   Social History  . Marital Status: Divorced    Spouse Name: N/A  . Number of Children: 3  . Years of Education: 12   Occupational History  . Not on file.   Social History Main Topics  . Smoking status: Former Smoker    Quit date: 06/10/2009  . Smokeless tobacco: Never Used  . Alcohol Use: No  . Drug Use: No  . Sexual Activity: Not on file   Other Topics Concern  . Not on file   Social History Narrative     Review of Systems  Difficult to obtain review of systems in this patient who has baseline altered mental status General:  No chills, fever.  Cardiovascular:  Denies any  chest pain or shortness breath, however answers no to a lot of things including "with the last time she had any chest pain" Dermatological: No rash, lesions/masses Respiratory: No cough, dyspnea Urologic: No hematuria, dysuria Abdominal:   No nausea, vomiting, diarrhea, bright red blood per rectum, melena, or hematemesis Neurologic:  No visual changes. AMS, remain weak, unable to sit up or roll on the side All other systems reviewed and are otherwise negative except as noted above.  Physical Exam  Blood pressure 106/56, pulse 105, temperature 99 F (37.2 C), temperature source Oral, resp. rate 31, height 5\' 7"  (1.702 m), weight 148 lb 9.4 oz (67.4 kg), SpO2 100 %.  General: NAD, eyes closed, only respond in 1 word Psych: unable to assess Neuro: Alert. Laying in bed, eyes closed. Oriented to location and the president, states the years 1916, off by 100 year. HEENT: Normal  Neck: Supple without bruits or JVD. Lungs:  Resp regular and unlabored, anterior exam CTA. Heart: irregular. no s3, s4. 3/6 systolic murmur at apex Abdomen: Soft, non-tender, BS + x 4. Mildly distended, but says no to tenderness Extremities: No clubbing, cyanosis  or edema. +weak radial pulse, but appears to have weak DP pulse as well. Feet warm.  Labs  No results for input(s): CKTOTAL, CKMB, TROPONINI in the last 72 hours. Lab Results  Component Value Date   WBC 14.3* 08/21/2015   HGB 9.3* 08/21/2015   HCT 29.1* 08/21/2015   MCV 94.2 08/21/2015   PLT 94* 08/21/2015    Recent Labs Lab 08/20/15 0510 08/21/15 0315  NA 131* 136  K 3.8 4.3  CL 89* 96*  CO2 29 25  BUN 32* 16  CREATININE 5.40* 3.73*  CALCIUM 8.0* 8.1*  PROT 4.7*  --   BILITOT 1.0  --   ALKPHOS 73  --   ALT 26  --   AST 64*  --   GLUCOSE 112* 210*   Lab Results  Component Value Date   CHOL * 06/20/2008    201        ATP III CLASSIFICATION:  <200     mg/dL   Desirable  161-096  mg/dL   Borderline High  >=045    mg/dL   High   HDL 66 40/98/1191   LDLCALC * 06/20/2008    120        Total Cholesterol/HDL:CHD Risk Coronary Heart Disease Risk Table                     Men   Women  1/2 Average Risk   3.4   3.3   TRIG 74 06/20/2008   No results found for: DDIMER  Radiology/Studies  Ct Abdomen Pelvis Wo Contrast  08/19/2015   CLINICAL DATA:  Acute onset of right lower quadrant abdominal pain for 1 day. Initial encounter.  EXAM: CT ABDOMEN AND PELVIS WITHOUT CONTRAST  TECHNIQUE: Multidetector CT imaging of the abdomen and pelvis was performed following the standard protocol without IV contrast.  COMPARISON:  Abdominal radiograph performed 01/22/2006  FINDINGS: Trace bilateral pleural effusions are noted, with bibasilar atelectasis and scarring. Cardiomegaly is noted. Diffuse coronary artery calcifications are seen. The patient is status post median sternotomy.  A small amount of slightly complex ascites is seen in the abdomen and pelvis, predominantly on the right side.  The liver and spleen are unremarkable in appearance. Scattered vascular calcifications are seen within the liver. The gallbladder is within normal limits. The pancreas and adrenal glands are unremarkable.  Bilateral renal atrophy is noted. Nonspecific stranding and fluid are noted about both kidneys. There is no evidence of hydronephrosis. No renal or ureteral stones are seen.  The small bowel is unremarkable in appearance. The stomach is within normal limits. No acute vascular abnormalities are seen. Scattered calcification is noted along the abdominal aorta and its branches, including along the renal arteries and superior mesenteric artery.  The appendix is not definitely seen. There is no evidence for appendicitis.  There appears to be pneumatosis along the cecum and ascending colon, of uncertain significance, with mild associated wall thickening and soft tissue inflammation. The remainder of the colon is grossly unremarkable.  The bladder is mildly distended and grossly unremarkable in appearance. Several calcified uterine fibroids are seen. The uterus is otherwise grossly unremarkable. The ovaries are relatively symmetric. No suspicious adnexal masses are seen. No inguinal lymphadenopathy is seen.  No acute osseous abnormalities are identified. Mild degenerative change is noted along the lumbar spine. There is chronic loss of height involving vertebral body L2.  IMPRESSION: 1. Apparent pneumatosis along the cecum and ascending colon at the right lower quadrant, of uncertain significance, with mild associated wall thickening and soft tissue inflammation. Though this could be benign, early changes of necrotizing enterocolitis cannot be excluded. Would correlate with the patient's symptoms. 2. Small amount of slightly complex ascites in the abdomen and pelvis, predominantly on the right. 3. Trace bilateral pleural effusions, with bibasilar atelectasis and scarring. 4. Cardiomegaly.  Diffuse coronary artery calcifications seen. 5. Bilateral renal atrophy noted. 6. Scattered calcification along the abdominal aorta and its branches, including along the renal arteries and superior mesenteric artery. 7. Calcified uterine  fibroids seen. These results were called by telephone at the time of interpretation on 08/19/2015 at 3:07 am to [facility] on [id number], who verbally acknowledged these results.   Electronically Signed   By: Roanna Raider M.D.   On: 08/19/2015 03:09   Dg Chest 1 View  08/21/2015   CLINICAL DATA:  Shortness of Breath.  Chronic renal failure  EXAM: CHEST 1 VIEW  COMPARISON:  August 18, 2015  FINDINGS: There is patchy airspace consolidation in both lower lobes, new on the right and increased on the left. There are small pleural effusions bilaterally. There is cardiomegaly with pulmonary venous hypertension. No interstitial edema is appreciable. Patient is status post coronary artery bypass grafting. No adenopathy. Bones appear somewhat sclerotic, consistent with chronic renal failure.  IMPRESSION: Evidence of a degree of congestive heart failure. Suspect superimposed pneumonia in the bases, particularly on the left. Bone show evidence of chronic renal failure.   Electronically Signed   By: Bretta Bang III M.D.   On: 08/21/2015 10:33   Dg Abd 1 View  08/21/2015   CLINICAL DATA:  Weakness and pain.  Chronic renal failure.  EXAM: ABDOMEN - 1 VIEW  COMPARISON:  CT abdomen and pelvis August 19, 2015  FINDINGS: There are multiple loops of mildly dilated small bowel. There is complex air in the at ascending colon with questionable pneumatosis in this area. Other areas of pneumatosis. No pneumoperitoneum is appreciable on this supine examination. There is contrast in the left colon and rectum. There are sigmoid and descending colonic diverticula noted. There are multiple foci of vascular calcification.  IMPRESSION: Mild small bowel dilatation. Question ileus versus enteritis. Obstruction is not felt to be likely. Note that contrast reaches the rectum.  Evidence suggesting pneumatosis in the right colon. Etiology uncertain. Ischemic change in this area cannot be  excluded. Note that this finding was also present on recent  CT examination.   Electronically Signed   By: Bretta Bang III M.D.   On: 08/21/2015 10:35   Ct Head Wo Contrast  09-11-2015   CLINICAL DATA:  Altered mental status, generalized weakness for 2 days.  EXAM: CT HEAD WITHOUT CONTRAST  TECHNIQUE: Contiguous axial images were obtained from the base of the skull through the vertex without intravenous contrast.  COMPARISON:  01/24/2006  FINDINGS: Old left occipital infarct. No acute intracranial abnormality. Specifically, no hemorrhage, hydrocephalus, mass lesion, acute infarction, or significant intracranial injury. No acute calvarial abnormality. Visualized paranasal sinuses and mastoids clear. Orbital soft tissues unremarkable.  IMPRESSION: No acute intracranial abnormality.  Old left occipital infarct.   Electronically Signed   By: Charlett Nose M.D.   On: Sep 11, 2015 13:00   Dg Chest Portable 1 View  2015-09-11   CLINICAL DATA:  Tachycardia.  EXAM: PORTABLE CHEST 1 VIEW  COMPARISON:  07/22/2014  FINDINGS: Prior median sternotomy and CABG. Cardiomegaly. Mild vascular congestion. No overt edema. No effusions. No acute bony abnormality.  IMPRESSION: Cardiomegaly with vascular congestion.  No overt failure.   Electronically Signed   By: Charlett Nose M.D.   On: 2015-09-11 11:48   Dg Foot 2 Views Right  08/20/2015   CLINICAL DATA:  Right foot pain for 1 day, no known injury, initial encounter  EXAM: RIGHT FOOT - 2 VIEW  COMPARISON:  None.  FINDINGS: No acute fracture or dislocation is noted. Mild osteopenia is seen. Diffuse vascular calcifications are noted.  IMPRESSION: No acute abnormality noted.   Electronically Signed   By: Alcide Clever M.D.   On: 08/20/2015 21:08    ECG  Atrial fibrillation with RVR, T wave inversion in inferolateral leads.  ASSESSMENT AND PLAN  1. Permanent atrial fibrillation with RVR on coumadin  - rate control limited by borderline BP, currently on toprol XL  and q6hr PO diltiazem, HR high 90s to low 100s, which is a  reasonable target given everything else going on  2. R pneumatosis with possible ischemic colitis  - conservative management for now by Gen Surgery  - likely high risk for any procedure given multitude of comorbidities, difficult to assess her cardiac status, however reassuring to see her EF is normal. New TWI noted during EKG, no new EKG while her HR is controlled, the TWI could be demand ischemia related to fast HR  3. Hypothyroidism: free T4 and T3 low during this admission, may need to increase synthroid  4. AMS: continue to be somewhat confused, cannot tell me what has been going on, says no to CP, no to SOB and no to "when is last time you had any CP". Orient to president and location, however starts year is 1918   5. Fever with possible PNA seen on CXR and pneumatosis of abd: per IM  - on Zosyn and vanc  6. h/o CAD s/p CABG (LIMA to LAD, SVG to diag, SVG to OM and SVG to distal RCA by Dr. Laneta Simmers 01/2006  - does not appear to have another cath since CABG  - myoview 10/2012 normal stress nuclear study  - does have new T wave inversion inferolateral leads during RVR  - Patient's altered mental status would not allow her to give any reasonable recent history.   7. Severe MR: noted on echo Dec 2015  - repeated echocardiogram 08/19/2015, EF 60-65%, mild AR with decreased mobility of non coronary cusp, moderate to severe  MR, moderate TR, severely dilated left and right atrium, PA peak pressure 48.  - will monitor for HF symptom, currently CTA anteriorly despite CXR says possible HF vs PNA  8. Worsening Anemia: hgb dropped from previous 11.9 one year ago down to 8  9. HTN 10. HLD 11. DM 12. ESRD on HD TTS 13. CVA on coumadin for afib  Signed, Azalee Course, PA-C 08/21/2015, 11:11 AM   I have seen and examined the patient along with Azalee Course, PA-C.  I have reviewed the chart, notes and new data.  I agree with PA's note.  Unable to obtain history or coherent ROS from the patient.    PLAN: Continue rate control for AF with RVR - in view of her acute illness, signs of infection and anemia, ventricular rates up to 110 bpm or so are acceptable. Arrhythmia is likely permanent, note severe biatrial dilation. ECG changes are likely rate related in the setting of known CAD, but do not reflect a true acute coronary event. Prognosis is very guarded due to the numerous and serious comorbid conditions. CXR suggests fluid overload, will need volume removal via HD.  Thurmon Fair, MD, [facility] [phone] 08/21/2015, 12:08 PM

## 2015-08-21 NOTE — Progress Notes (Signed)
Patient WBC count up to 14.3 overnight from 10.5 yesterday AM with fever of 101.1. Of note: patient has also been tachycardic and tachypneic since admission on 2015/08/25. Otherwise, vital signs are unchanged from previous values: Today's Vitals   08/20/15 2012 08/20/15 2120 08/21/15 0018 08/21/15 0523  BP: 116/56  124/57 109/49  Pulse: 114  116 105  Temp: 100.8 F (38.2 C)  100.9 F (38.3 C) 101.1 F (38.4 C)  TempSrc: Oral  Oral Oral  Resp: 39  38 31  Height:    (1.702 m)   Weight:   67.4 kg (148 lb 9.4 oz)   SpO2: 100%  100% 100%  PainSc:  0-No pain     Patient is difficult to arouse and is disoriented to place, time and situation (also unchanged from hospital baseline), but is able to take PO medications with vigilant prompting.  Medicated with tylenol  PO and diltiazem  PO per current orders.  Spoke with Tama Gander, NP for [facility], briefly to make aware of patient condition. Also paged and spoke with Dr. Donell Beers, on-call for general surgery regarding same.  Concern is for worsening necrotizing enterocolitis with possible early sepsis.  Orders received to begin IV vancomycin per pharmacy consult and obtain additional blood cultures x 2 sites.  Interventions in progress. Will continue to closely monitor.

## 2015-08-21 NOTE — Progress Notes (Signed)
Subjective: Eating breakfast Denies abdominal pain  Objective: Vital signs in last 24 hours: Temp:  [98.9 F (37.2 C)-101.1 F (38.4 C)] 99 F (37.2 C) (10/07 0727) Pulse Rate:  [93-116] 105 (10/07 0523) Resp:  [20-39] 31 (10/07 0523) BP: (91-128)/(43-71) 109/49 mmHg (10/07 0523) SpO2:  [100 %] 100 % (10/07 0523) Weight:  [67.4 kg (148 lb 9.4 oz)] 67.4 kg (148 lb 9.4 oz) (10/07 0018) Last BM Date: 08/19/15  Intake/Output from previous day: 10/06 0701 - 10/07 0700 In: 1170 [P.O.:450; I.V.:120; IV Piggyback:600] Out: 100  Intake/Output this shift: Total I/O In: 300 [P.O.:300] Out: -   GI: soft NT no rebound or guarding ND   Lab Results:   Recent Labs  08/20/15 0510 08/20/15 1945 08/21/15 0315  WBC 10.5  --  14.3*  HGB 7.5* 9.0* 9.3*  HCT 23.3* 28.4* 29.1*  PLT 86*  --  94*   BMET  Recent Labs  08/20/15 0510 08/21/15 0315  NA 131* 136  K 3.8 4.3  CL 89* 96*  CO2 29 25  GLUCOSE 112* 210*  BUN 32* 16  CREATININE 5.40* 3.73*  CALCIUM 8.0* 8.1*   PT/INR  Recent Labs  08/20/15 0510 08/21/15 0315  LABPROT 32.9* 33.1*  INR 3.30* 3.33*   ABG No results for input(s): PHART, HCO3 in the last 72 hours.  Invalid input(s): PCO2, PO2  Studies/Results: Dg Foot 2 Views Right  08/20/2015   CLINICAL DATA:  Right foot pain for 1 day, no known injury, initial encounter  EXAM: RIGHT FOOT - 2 VIEW  COMPARISON:  None.  FINDINGS: No acute fracture or dislocation is noted. Mild osteopenia is seen. Diffuse vascular calcifications are noted.  IMPRESSION: No acute abnormality noted.   Electronically Signed   By: Alcide Clever M.D.   On: 08/20/2015 21:08    Anti-infectives: Anti-infectives    Start     Dose/Rate Route Frequency Ordered Stop   08/22/15 1200  vancomycin (VANCOCIN) IVPB 750 mg/150 ml premix     750 mg 150 mL/hr over 60 Minutes Intravenous Every T-Th-Sa (Hemodialysis) 08/21/15 0602     08/21/15 0615  vancomycin (VANCOCIN) 1,500 mg in sodium chloride 0.9  % 500 mL IVPB     1,500 mg 250 mL/hr over 120 Minutes Intravenous  Once 08/21/15 0602     08/19/15 0600  piperacillin-tazobactam (ZOSYN) IVPB 2.25 g    Comments:  Zosyn 2.25 g IV q8h in ESRD   2.25 g 100 mL/hr over 30 Minutes Intravenous 3 times per day 08/19/15 0539        Assessment/Plan:  Patient Active Problem List   Diagnosis Date Noted  . Sepsis (HCC) 08/21/2015  . Fever, unspecified 08/21/2015  . Leukocytosis 08/21/2015  . PAD (peripheral artery disease) (HCC) 08/21/2015  . Right foot pain 08/21/2015  . RLQ abdominal pain September 01, 2015  . Thrombocytopenia (HCC) 09-01-2015  . Anemia, chronic renal failure 09-01-15  . Hypothyroidism 09/01/15  . Metabolic encephalopathy 09-01-2015  . Severe mitral regurgitation 10/15/2014  . Coronary artery disease 10/18/2013  . Peripheral arterial disease (HCC) 10/18/2013  . Diabetes mellitus, insulin dependent (IDDM), controlled (HCC) 06/10/2013  . HTN (hypertension) 06/10/2013  . CKD (chronic kidney disease) stage V requiring chronic dialysis (HCC) 06/10/2013  . HLD (hyperlipidemia) 06/10/2013  . Atrial fibrillation with RVR (HCC) 02/01/2013  . Long term current use of anticoagulant therapy 02/01/2013    Exam benign this am No rebound, guarding or signs of peritonitis  Will check plain film given rising WBC and  agree with adding vancomycin   LOS: 3 days    Lisa Blakeman A. 08/21/2015

## 2015-08-21 NOTE — Consult Note (Signed)
Vascular and Vein Specialist of [facility]  Patient name: Megan Vasquez MRN: [id number] DOB: 1941/09/07 Sex: female  REASON FOR CONSULT: Right leg pain with known history of peripheral vascular disease  HPI: Megan Vasquez is a 74 y.o. female who was admitted several days ago with lethargy, poor appetite, and a change in mental status. During this admission, she is undergone a CT scan with concerns for pneumatosis along the cecum and ascending colon. She also had some wall thickening and soft tissue inflammation with concerns for early necrotizing enterocolitis. She has had abdominal pain and poor appetite. According to the family she's been having some right leg pain and they wished that vascular surgery be involved as I had seen her in the past leg pain. She did undergo an arteriogram in May 2015 which showed she was really not a candidate for revascularization.  I am not able to obtain any significant history from the patient this morning. She is somewhat lethargic.  She has multiple medical issues including: severe mitral regurgitation, diabetes, chronic kidney disease (she dialyzes on Tuesdays Thursdays and Saturdays), chronic atrial fibrillation on Coumadin: INR = 3.3.  Past Medical History  Diagnosis Date  . Diabetes mellitus without complication (HCC)   . Hypertension   . Hyperlipidemia   . Atrial fibrillation (HCC)   . ESRD (end stage renal disease) (HCC)     HD since 06/2000  . CAD (coronary artery disease)   . 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 History of nuclear stress test 10/2012    lexiscan; normal stress test; post-stress EF72%  . Peripheral arterial disease (HCC)   . Osteoporosis   . Mitral regurgitation     severe by 2-D echocardiogram performed 10/22/14    Family History  Problem Relation Age of Onset  . Heart disease Mother    . Heart attack Brother   . Breast cancer Sister   . Diabetes Sister   . Heart attack Sister   . Diabetes Child     SOCIAL HISTORY: Social History  Substance Use Topics  . Smoking status: Former Smoker    Quit date: 06/10/2009  . Smokeless tobacco: Never Used  . Alcohol Use: No    No Known Allergies  Current Facility-Administered Medications  Medication Dose Route Frequency Provider Last Rate Last Dose  . 0.9 %  sodium chloride infusion  250 mL Intravenous PRN Russella Dar, NP      . acetaminophen (TYLENOL) tablet 650 mg  650 mg Oral Q6H PRN Russella Dar, NP   650 mg at 08/21/15 0546   Or  . acetaminophen (TYLENOL) suppository 650 mg  650 mg Rectal Q6H PRN Russella Dar, NP      . calcium acetate (PHOSLO) capsule 1,334 mg  1,334 mg Oral TID WC Richarda Overlie, MD   1,334 mg at 08/20/15 1740  . calcium carbonate (OS-CAL - dosed in mg of elemental calcium) tablet 1,250 mg  1,250 mg Oral Q breakfast Richarda Overlie, MD   1,250 mg at 08/20/15 2536  . Darbepoetin Alfa (ARANESP) injection 150 mcg  150 mcg Intravenous Q Tue-HD Jetty Duhamel, NP   150 mcg at 09/05/2015 1757  . diltiazem (CARDIZEM) 100 mg in dextrose 5 % 100 mL (1 mg/mL) infusion  5-15 mg/hr Intravenous Continuous Theda Belfast, MD   Stopped at 08/20/15 [phone]  .  diltiazem (CARDIZEM) tablet 30 mg  30 mg Oral 4 times per day Leatha Gilding, MD   30 mg at 08/21/15 0546  . doxercalciferol (HECTOROL) injection 2 mcg  2 mcg Intravenous Q T,Th,Sa-HD Jetty Duhamel, NP   2 mcg at 08/20/15 1630  . feeding supplement (BOOST / RESOURCE BREEZE) liquid 1 Container  1 Container Oral TID BM Costin Otelia Sergeant, MD   1 Container at 08/20/15 1400  . ferric gluconate (NULECIT) 125 mg in sodium chloride 0.9 % 100 mL IVPB  125 mg Intravenous Q T,Th,Sa-HD Jetty Duhamel, NP   125 mg at 08/20/15 1500  . insulin aspart (novoLOG) injection 0-5 Units  0-5 Units Subcutaneous QHS Roma Kayser Schorr, NP   0 Units at 08/20/15 2025  . insulin aspart  (novoLOG) injection 0-9 Units  0-9 Units Subcutaneous TID WC Roma Kayser Schorr, NP      . levothyroxine (SYNTHROID, LEVOTHROID) tablet 250 mcg  250 mcg Oral QAC breakfast Richarda Overlie, MD   250 mcg at 08/20/15 8119  . metoprolol succinate (TOPROL-XL) 24 hr tablet 25 mg  25 mg Oral Daily Russella Dar, NP   25 mg at 08/20/15 0823  . morphine 2 MG/ML injection 1 mg  1 mg Intravenous Q2H PRN Russella Dar, NP   1 mg at 08/20/15 1245  . multivitamin (RENA-VIT) tablet 1 tablet  1 tablet Oral QHS Jetty Duhamel, NP   1 tablet at 08/20/15 2119  . ondansetron (ZOFRAN) tablet 4 mg  4 mg Oral Q6H PRN Russella Dar, NP       Or  . ondansetron [facility]) injection 4 mg  4 mg Intravenous Q6H PRN Russella Dar, NP      . piperacillin-tazobactam (ZOSYN) IVPB 2.25 g  2.25 g Intravenous 3 times per day Richarda Overlie, MD   2.25 g at 08/21/15 0610  . pneumococcal 23 valent vaccine (PNU-IMMUNE) injection 0.5 mL  0.5 mL Intramuscular Tomorrow-1000 Richarda Overlie, MD   0.5 mL at 08/19/15 1000  . pravastatin (PRAVACHOL) tablet 40 mg  40 mg Oral q1800 Russella Dar, NP   40 mg at 08/20/15 1740  . sodium chloride 0.9 % injection 3 mL  3 mL Intravenous Q12H Russella Dar, NP   3 mL at 08/20/15 0825  . sodium chloride 0.9 % injection 3 mL  3 mL Intravenous PRN Russella Dar, NP      . vancomycin (VANCOCIN) 1,500 mg in sodium chloride 0.9 % 500 mL IVPB  1,500 mg Intravenous Once Juliette Mangle, RPH   1,500 mg at 08/21/15 0644  . [START ON 08/22/2015] vancomycin (VANCOCIN) IVPB 750 mg/150 ml premix  750 mg Intravenous Q T,Th,Sa-HD Veronda P Bryk, RPH      . Warfarin - Pharmacist Dosing Inpatient   Does not apply q1800 Meagan A Sherrie George, [facility]   Stopped at 08/20/15 1800    REVIEW OF SYSTEMS: Unable to obtain  PHYSICAL EXAM: Filed Vitals:   08/20/15 1724 08/20/15 2012 08/21/15 0018 08/21/15 0523  BP: 113/58 116/56 124/57 109/49  Pulse:  114 116 105  Temp:  100.8 F (38.2 C) 100.9 F (38.3 C) 101.1 F (38.4 C)    TempSrc:  Oral Oral Oral  Resp: 26 39 38 31  Height:    (1.702 m)   Weight:   148 lb 9.4 oz (67.4 kg)   SpO2:  100% 100% 100%   Body mass index is 23.27 kg/(m^2). GENERAL: The patient is a well-nourished female,  in no acute distress. The vital signs are documented above. CARDIAC: There is an Irregular rhythm VASCULAR: I do not detect carotid bruits, however, at its difficult to get her to hold her breath to fully assess. She has palpable femoral pulses. I cannot palpate popliteal or pedal pulses on either side. PULMONARY: There is good air exchange bilaterally without wheezing or rales. ABDOMEN: She has some mild abdominal tenderness. MUSCULOSKELETAL: There are no major deformities. NEUROLOGIC: No focal weakness or paresthesias are detected. SKIN: I do not see any ulcerations on her feet. PSYCHIATRIC: she is lethargic.  DATA:  Lab Results  Component Value Date   WBC 14.3* 08/21/2015   HGB 9.3* 08/21/2015   HCT 29.1* 08/21/2015   MCV 94.2 08/21/2015   PLT 94* 08/21/2015   Lab Results  Component Value Date   NA 136 08/21/2015   K 4.3 08/21/2015   CL 96* 08/21/2015   CO2 25 08/21/2015   Lab Results  Component Value Date   CREATININE 3.73* 08/21/2015   Lab Results  Component Value Date   INR 3.33* 08/21/2015   INR 3.30* 08/20/2015   INR 2.33* 08/19/2015   Lab Results  Component Value Date   HGBA1C 7.2* 08/15/2015   CBG (last 3)   Recent Labs  08/20/15 1735 08/20/15 2017 08/21/15 0015  GLUCAP 127* 200* 192*   ARTERIOGRAM: I reviewed her arteriogram which was done in May 2015. On the right side, which is the symptomatic side, the common femoral and deep femoral arteries were patent. There was moderate disease throughout the superficial femoral artery and severe disease of the below-knee popliteal artery tibials were all occluded with reconstitution of the peroneal artery only. I did not think she was a good candidate for revascularization.  MEDICAL  ISSUES:  INFRAINGUINAL ARTERIAL OCCLUSIVE DISEASE: I have reviewed her previous arteriogram which shows she is not a candidate for revascularization. I do not see any wounds which would explain her fever and sepsis and I think this is most likely related to her abdomen and not her feet. If she developed an ulceration options would be primary amputation or palliative care. Based on her previous arteriogram year ago, she did not have any endovascular options for revascularization. Given her markedly debilitated state and multiple medical comorbidities I do not think she is a candidate for revascularization. Vascular surgery will be available as needed. If there are any questions over the weekend Dr. Arbie Cookey is on call.   Waverly Ferrari [facility] [place] Beeper: [phone]

## 2015-08-21 NOTE — Progress Notes (Signed)
ANTICOAGULATION CONSULT NOTE - Follow up   Pharmacy Consult for warfarin Indication: h/o atrial fibrillation  No Known Allergies  Patient Measurements: Height:  (170.2 cm) Weight: 148 lb 9.4 oz (67.4 kg) IBW/kg (Calculated) : 61.6  Vital Signs: Temp: 100.7 F (38.2 C) (10/07 1634) Temp Source: Axillary (10/07 1634) BP: 99/52 mmHg (10/07 1717)  Labs:  Recent Labs  08/19/15 0025 08/20/15 0510 08/20/15 1945 08/21/15 0315  HGB 8.0* 7.5* 9.0* 9.3*  HCT 26.5* 23.3* 28.4* 29.1*  PLT 76* 86*  --  94*  LABPROT 25.3* 32.9*  --  33.1*  INR 2.33* 3.30*  --  3.33*  CREATININE 3.82* 5.40*  --  3.73*    Estimated Creatinine Clearance: 12.5 mL/min (by C-G formula based on Cr of 3.73).   Medical History: Past Medical History  Diagnosis Date  . Diabetes mellitus without complication (HCC)   . Hypertension   . Hyperlipidemia   . Atrial fibrillation (HCC)   . ESRD (end stage renal disease) (HCC)     HD since 06/2000  . CAD (coronary artery disease)   . 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 History of nuclear stress test 10/2012    lexiscan; normal stress test; post-stress EF72%  . Peripheral arterial disease (HCC)   . Osteoporosis   . Mitral regurgitation     severe by 2-D echocardiogram performed 10/22/14    Medications:  Prescriptions prior to admission  Medication Sig Dispense Refill Last Dose  . calcium acetate (PHOSLO) 667 MG capsule Take 1,334 mg by mouth 3 (three) times daily with meals.    08/17/2015 at Unknown time  . diltiazem (CARDIZEM CD) 240 MG 24 hr capsule TAKE ONE CAPSULE BY MOUTH DAILY 30 capsule 6 08/17/2015 at Unknown time  . insulin glargine (LANTUS) 100 UNIT/ML injection Inject 10 Units into the skin 2 (two) times daily. 10 units twice a day   08/17/2015 at Unknown time  . levothyroxine (SYNTHROID, LEVOTHROID) 200 MCG  tablet Take 200 mcg by mouth daily before breakfast. Take with to equal total dose of   08/17/2015 at Unknown time  . levothyroxine (SYNTHROID, LEVOTHROID) 50 MCG tablet Take 50 mcg by mouth daily. Take  With to equal   08/17/2015 at Unknown time  . metoprolol succinate (TOPROL-XL) 50 MG 24 hr tablet Take 1/2 tablet ( ) by mouth daily. (Patient taking differently: Take 25 mg by mouth daily. ) 30 tablet 10 08/17/2015 at Unknown time  . pravastatin (PRAVACHOL) 40 MG tablet Take 40 mg by mouth every morning.    08/17/2015 at Unknown time  . warfarin (COUMADIN) 3 MG tablet Take 4.5-6 mg by mouth daily. Take 2 tablets ( ) on Monday, and 1 1/2 (4.5MG ) tablet all other days   08/17/2015 at Unknown time  . acetaminophen (TYLENOL) 325 MG tablet Take 325 mg by mouth every 6 (six) hours as needed for moderate pain.   Past Month at Unknown time  . Calcium Carbonate (CALCIUM 600 PO) Take 1 tablet by mouth 3 (three) times daily.    Taking  . diltiazem (CARDIZEM CD) 240 MG 24 hr capsule Take 240 mg by mouth daily.   11/13/2014 at Unknown time  . lidocaine-prilocaine (EMLA) cream Apply 1 application topically Every Tuesday,Thursday,and Saturday with dialysis.   11/13/2014 at Unknown time  . metoprolol succinate (TOPROL-XL) 50  MG 24 hr tablet Take 25 mg by mouth daily. Take with or immediately following a meal.   11/13/2014 at 0700  . OVER THE COUNTER MEDICATION Apply 1 application topically Every Tuesday,Thursday,and Saturday with dialysis. Diabetic foot cream used for HD   Taking  . warfarin (COUMADIN) 3 MG tablet Take 1.5 to 2 tablets by mouth daily as directed by coumadin clinic (Patient not taking: Reported on Sep 08, 2015) 150 tablet 1   . warfarin (COUMADIN) 3 MG tablet TAKE 1.5 TO 2 TABLETS BY MOUTH DAILY AS DIRECTED BY COUMADIN CLINIC (Patient not taking: Reported on 2015/09/08) 50 tablet 3     Assessment: 89 yoF w/ ESRD on HD presented pm 09-08-2015 with 2d history of lethargy, decreased  appetite and AMS. On chronic coumadin for Atrial fibrillation. CHADVASc = 6. Continues coumadin inpatient.  INR today 10/7 remains supratherapeutic, INR =3.33. Coumadin dose was held yesterday.   Thrombocytopenia/anemi, chronic reanl failure: h/o anemia, ESRD on HD  qTTs.  Hgb 8.8> 8.0>7.5> 9.3. PLTC = 80K>76>86>94K. No bleeding noted. On Aranesp and IV Nulecit (iron).  On clear liquid diet. MD notes Hgb has decreased from a baseline of around 10-11and uncertain if this is dilutional or indicative of bleeding; close monitoring.  Transfused 1 unit PRBC in HD on 10/6. Hgb today 9.3, pltc 94K  Home warfarin dose = 6 mg on Mondays, 4.5 mg all other days. Last taken PTA on 10/3 Monday.   Goal of Therapy:  INR 2-3 Monitor platelets by anticoagulation protocol: Yes   Plan:  Hold coumadin today.  Monitor platelets closely Daily INR, CBC q72h  Noah Delaine, RPh Clinical Pharmacist Pager: [phone] 08/21/2015,5:43 PM

## 2015-08-21 NOTE — Progress Notes (Signed)
Inpatient Diabetes Program Recommendations  AACE/ADA: New Consensus Statement on Inpatient Glycemic Control (2015)  Target Ranges:  Prepandial:   less than 140 mg/dL      Peak postprandial:   less than 180 mg/dL (1-2 hours)      Critically ill patients:  140 - 180 mg/dL   Review of Glycemic Control  Diabetes history: DM 2 Outpatient Diabetes medications: Lantus 10 units BID Current orders for Inpatient glycemic control: Novolog Sensitive TID + HS  Inpatient Diabetes Program Recommendations: Insulin - Basal: Patient takes basal insulin BID at home. Glucose >200 this am. Please consider starting Lantus 8 units BID.   Thanks, Christena Deem RN, MSN, [facility] Inpatient Diabetes Coordinator Team Pager [phone] (8a-5p)

## 2015-08-21 NOTE — Progress Notes (Signed)
Subjective:  Slow to answer questions , no cos', knows she is at [facility] and HD days are TTS, recognizing me from [facility].   Objective Vital signs in last 24 hours: Filed Vitals:   08/21/15 0018 08/21/15 0523 08/21/15 0727 08/21/15 0853  BP: 124/57 109/49  106/56  Pulse: 116 105    Temp: 100.9 F (38.3 C) 101.1 F (38.4 C) 99 F (37.2 C)   TempSrc: Oral Oral Oral   Resp: 38 31    Height:  (1.702 m)     Weight: 67.4 kg (148 lb 9.4 oz)     SpO2: 100% 100%     Weight change: -0.005 kg (-0.2 oz)  Physical Exam:  General-  elderly AAF,  somewhat slow to answer questions/ chronically ill   Lungs -CTA bilat   CARD - Irrreg, Irrerg, 102 VR , soft 1/6 sem   Abd - softLQ tenderness, +BS  Extrem . - no pedal edema  Dialysis access- L  AVF Pos. Bruit    OP HD =  4 hrs 65.5kgs 2K/2Ca L AVF 1000u hep hectorol 2 venofor 100 q HD until 10/8 then 50 q week Micera 100 q2 weeks - to start 10/6    Problem/Plan: 1 Abd pain/ pneumatosis of colon by CT/Ischemic Colitis currently manage with Conservative  Management poss  abx and IVF's for now/ Abdominal x-ray obtained  This am xray showspneumatosis in the right colon, also questionable ileus versus enteritis  Vanc added per gen surg rec's given rising WBC.  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  Vinson Moselle MD pager [phone]    cell [phone] 08/21/2015, 2:24 PM    Labs: Basic Metabolic Panel:  Recent Labs Lab 08/19/15 0025  08/20/15 0510 08/21/15 0315  NA 133* 131* 136  K 3.4* 3.8 4.3  CL 92* 89* 96*  CO2 GLUCOSE 218* 112* 210*  BUN 24* 32* 16  CREATININE 3.82* 5.40* 3.73*  CALCIUM 7.1* 8.0* 8.1*   Liver Function Tests:  Recent Labs Lab Sep 17, 2015 2020 08/19/15 0025 08/20/15 0510  AST 61* 53* 64*  ALT ALKPHOS 96 81 73  BILITOT 0.9 0.6 1.0  PROT 5.2* 4.5* 4.7*  ALBUMIN 1.8* 1.6* 1.6*    Recent Labs Lab 09-17-15 2020  LIPASE 17*    Recent Labs Lab Sep 17, 2015 2020  AMMONIA 26   CBC:  Recent Labs Lab 09/17/2015 1120 08/19/15 0025 08/20/15 0510 08/20/15 1945 08/21/15 0315  WBC 9.7 5.9 10.5  --  14.3*  NEUTROABS 8.9*  --   --   --   --   HGB 8.8* 8.0* 7.5* 9.0* 9.3*  HCT 29.4* 26.5* 23.3* 28.4* 29.1*  MCV 98.3 97.1 94.3  --  94.2  PLT 80* 76* 86*  --  94*   Cardiac Enzymes: No results for input(s): CKTOTAL, CKMB,  CKMBINDEX, TROPONINI in the last 168 hours. CBG:  Recent Labs Lab 08/20/15 1735 08/20/15 2017 08/21/15 0015 08/21/15 0725 08/21/15 1113  GLUCAP 127* 200* 192* 207* 216*    Studies/Results: Dg Chest 1 View  08/21/2015   CLINICAL DATA:  Shortness of Breath.  Chronic renal failure  EXAM: CHEST 1 VIEW  COMPARISON:  August 18, 2015  FINDINGS: There is patchy airspace consolidation in both lower lobes, new on the right and increased on the left. There are small pleural effusions bilaterally. There is cardiomegaly with pulmonary venous hypertension. No interstitial edema is appreciable. Patient is status post coronary artery bypass grafting. No adenopathy. Bones appear somewhat sclerotic, consistent with chronic renal failure.  IMPRESSION: Evidence of a degree of congestive heart failure. Suspect superimposed pneumonia in the bases, particularly on the left. Bone show evidence of chronic renal failure.   Electronically Signed   By: Bretta Bang III M.D.   On: 08/21/2015 10:33   Dg Abd 1 View  08/21/2015   CLINICAL DATA:  Weakness and pain.  Chronic  renal failure.  EXAM: ABDOMEN - 1 VIEW  COMPARISON:  CT abdomen and pelvis August 19, 2015  FINDINGS: There are multiple loops of mildly dilated small bowel. There is complex air in the at ascending colon with questionable pneumatosis in this area. Other areas of pneumatosis. No pneumoperitoneum is appreciable on this supine examination. There is contrast in the left colon and rectum. There are sigmoid and descending colonic diverticula noted. There are multiple foci of vascular calcification.  IMPRESSION: Mild small bowel dilatation. Question ileus versus enteritis. Obstruction is not felt to be likely. Note that contrast reaches the rectum.  Evidence suggesting pneumatosis in the right colon. Etiology uncertain. Ischemic change in this area cannot be excluded. Note that this finding was also present on recent CT examination.   Electronically Signed   By: Bretta Bang III M.D.   On: 08/21/2015 10:35   Dg Foot 2 Views Right  08/20/2015   CLINICAL DATA:  Right foot pain for 1 day, no known injury, initial encounter  EXAM: RIGHT FOOT - 2 VIEW  COMPARISON:  None.  FINDINGS: No acute fracture or dislocation is noted. Mild osteopenia is seen. Diffuse vascular calcifications are noted.  IMPRESSION: No acute abnormality noted.   Electronically Signed   By: Alcide Clever M.D.   On: 08/20/2015 21:08   Medications: . diltiazem (CARDIZEM) infusion Stopped (08/20/15 0955)   . calcium acetate  1,334 mg Oral TID WC  . calcium carbonate  1,250 mg Oral Q breakfast  . darbepoetin (ARANESP) injection - DIALYSIS  150 mcg Intravenous Q Tue-HD  . diltiazem  30 mg Oral 4 times per day  . doxercalciferol  2 mcg Intravenous Q T,Th,Sa-HD  . feeding supplement  1 Container Oral TID BM  . ferric gluconate (FERRLECIT/NULECIT) IV  125 mg Intravenous Q T,Th,Sa-HD  . insulin aspart  0-5 Units Subcutaneous QHS  . insulin aspart  0-9 Units Subcutaneous TID WC  . levothyroxine  250 mcg Oral QAC breakfast  . metoprolol succinate   25 mg Oral Daily  . multivitamin  1 tablet Oral QHS  . piperacillin-tazobactam (ZOSYN)  IV  2.25 g Intravenous 3 times per day  . pneumococcal 23 valent vaccine  0.5 mL Intramuscular Tomorrow-1000  . pravastatin  40 mg Oral q1800  . sodium chloride  3 mL Intravenous Q12H  . [START ON 08/22/2015] vancomycin  750 mg Intravenous Q T,Th,Sa-HD  . Warfarin - Pharmacist Dosing Inpatient  Does not apply q1800

## 2015-08-21 NOTE — Progress Notes (Signed)
ANTIBIOTIC CONSULT NOTE - INITIAL  Pharmacy Consult for vancomycin Indication: r/o necrotizing enterocolitis +/- early sepsis  No Known Allergies  Patient Measurements: Height: 5\' 7"  (170.2 cm) Weight: 148 lb 9.4 oz (67.4 kg) IBW/kg (Calculated) : 61.6  Vital Signs: Temp: 101.1 F (38.4 C) (10/07 0523) Temp Source: Oral (10/07 0523) BP: 109/49 mmHg (10/07 0523) Pulse Rate: 105 (10/07 0523)  Labs:  Recent Labs  08/19/15 0025 08/20/15 0510 08/20/15 1945 08/21/15 0315  WBC 5.9 10.5  --  14.3*  HGB 8.0* 7.5* 9.0* 9.3*  PLT 76* 86*  --  94*  CREATININE 3.82* 5.40*  --  3.73*   Estimated Creatinine Clearance: 12.5 mL/min (by C-G formula based on Cr of 3.73).   Microbiology: Recent Results (from the past 720 hour(s))  MRSA PCR Screening     Status: None   Collection Time: 08/17/2015  9:05 AM  Result Value Ref Range Status   MRSA by PCR NEGATIVE NEGATIVE Final    Comment:        The GeneXpert MRSA Assay (FDA approved for NASAL specimens only), is one component of a comprehensive MRSA colonization surveillance program. It is not intended to diagnose MRSA infection nor to guide or monitor treatment for MRSA infections.   Urine culture     Status: None   Collection Time: 09/10/2015 12:27 PM  Result Value Ref Range Status   Specimen Description URINE, CLEAN CATCH  Final   Special Requests NONE  Final   Culture MULTIPLE SPECIES PRESENT, SUGGEST RECOLLECTION  Final   Report Status 08/19/2015 FINAL  Final  Culture, blood (routine x 2)     Status: None (Preliminary result)   Collection Time: 09/12/2015  8:20 PM  Result Value Ref Range Status   Specimen Description BLOOD RIGHT WRIST  Final   Special Requests IN PEDIATRIC BOTTLE 3CC  Final   Culture NO GROWTH 2 DAYS  Final   Report Status PENDING  Incomplete  Culture, blood (routine x 2)     Status: None (Preliminary result)   Collection Time: 08/16/2015  8:30 PM  Result Value Ref Range Status   Specimen Description BLOOD  RIGHT HAND  Final   Special Requests IN PEDIATRIC BOTTLE 3CC  Final   Culture NO GROWTH 2 DAYS  Final   Report Status PENDING  Incomplete    Medical History: Past Medical History  Diagnosis Date  . Diabetes mellitus without complication (HCC)   . Hypertension   . Hyperlipidemia   . Atrial fibrillation (HCC)   . ESRD (end stage renal disease) (HCC)     HD since 06/2000  . CAD (coronary artery disease)   . 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 History of nuclear stress test 10/2012    lexiscan; normal stress test; post-stress EF72%  . Peripheral arterial disease (HCC)   . Osteoporosis   . Mitral regurgitation     severe by 2-D echocardiogram performed 10/22/14    Medications:  Prescriptions prior to admission  Medication Sig Dispense Refill Last Dose  . calcium acetate (PHOSLO) 667 MG capsule Take 1,334 mg by mouth 3 (three) times daily with meals.    08/17/2015 at Unknown time  . diltiazem (CARDIZEM CD) 240 MG 24 hr capsule TAKE ONE CAPSULE BY MOUTH DAILY 30 capsule 6 08/17/2015 at Unknown time  . insulin glargine (LANTUS) 100  UNIT/ML injection Inject 10 Units into the skin 2 (two) times daily. 10 units twice a day   08/17/2015 at Unknown time  . levothyroxine (SYNTHROID, LEVOTHROID) 200 MCG tablet Take 200 mcg by mouth daily before breakfast. Take with to equal total dose of   08/17/2015 at Unknown time  . levothyroxine (SYNTHROID, LEVOTHROID) 50 MCG tablet Take 50 mcg by mouth daily. Take  With to equal   08/17/2015 at Unknown time  . metoprolol succinate (TOPROL-XL) 50 MG 24 hr tablet Take 1/2 tablet ( ) by mouth daily. (Patient taking differently: Take 25 mg by mouth daily. ) 30 tablet 10 08/17/2015 at Unknown time  . pravastatin (PRAVACHOL) 40 MG tablet Take 40 mg by mouth every morning.    08/17/2015 at Unknown time  .  warfarin (COUMADIN) 3 MG tablet Take 4.5-6 mg by mouth daily. Take 2 tablets ( ) on Monday, and 1 1/2 (4.5MG ) tablet all other days   08/17/2015 at Unknown time  . acetaminophen (TYLENOL) 325 MG tablet Take 325 mg by mouth every 6 (six) hours as needed for moderate pain.   Past Month at Unknown time  . Calcium Carbonate (CALCIUM 600 PO) Take 1 tablet by mouth 3 (three) times daily.    Taking  . diltiazem (CARDIZEM CD) 240 MG 24 hr capsule Take 240 mg by mouth daily.   11/13/2014 at Unknown time  . lidocaine-prilocaine (EMLA) cream Apply 1 application topically Every Tuesday,Thursday,and Saturday with dialysis.   11/13/2014 at Unknown time  . metoprolol succinate (TOPROL-XL) 50 MG 24 hr tablet Take 25 mg by mouth daily. Take with or immediately following a meal.   11/13/2014 at 0700  . OVER THE COUNTER MEDICATION Apply 1 application topically Every Tuesday,Thursday,and Saturday with dialysis. Diabetic foot cream used for HD   Taking  . warfarin (COUMADIN) 3 MG tablet Take 1.5 to 2 tablets by mouth daily as directed by coumadin clinic (Patient not taking: Reported on 09-15-2015) 150 tablet 1   . warfarin (COUMADIN) 3 MG tablet TAKE 1.5 TO 2 TABLETS BY MOUTH DAILY AS DIRECTED BY COUMADIN CLINIC (Patient not taking: Reported on 2015/09/15) 50 tablet 3    Scheduled:  . calcium acetate  1,334 mg Oral TID WC  . calcium carbonate  1,250 mg Oral Q breakfast  . darbepoetin (ARANESP) injection - DIALYSIS  150 mcg Intravenous Q Tue-HD  . diltiazem  30 mg Oral 4 times per day  . doxercalciferol  2 mcg Intravenous Q T,Th,Sa-HD  . feeding supplement  1 Container Oral TID BM  . ferric gluconate (FERRLECIT/NULECIT) IV  125 mg Intravenous Q T,Th,Sa-HD  . insulin aspart  0-5 Units Subcutaneous QHS  . insulin aspart  0-9 Units Subcutaneous TID WC  . levothyroxine  250 mcg Oral QAC breakfast  . metoprolol succinate  25 mg Oral Daily  . multivitamin  1 tablet Oral QHS  . piperacillin-tazobactam (ZOSYN)  IV  2.25 g  Intravenous 3 times per day  . pneumococcal 23 valent vaccine  0.5 mL Intramuscular Tomorrow-1000  . pravastatin  40 mg Oral q1800  . sodium chloride  3 mL Intravenous Q12H  . Warfarin - Pharmacist Dosing Inpatient   Does not apply q1800   Infusions:  . diltiazem (CARDIZEM) infusion Stopped (08/20/15 0955)    Assessment: 74yo female admitted 10/4 w/ Afib w/ RVR and RLQ abd pain, now w/ concern for early sepsis and possible necrotizing enterocolitis, to begin IV ABX.  Goal of Therapy:  Pre-HD vanc level  15-25  Plan:  Will give vancomycin  IV x1 followed by  IV after each HD and monitor CBC, Cx, levels prn.  Vernard Gambles, PharmD, BCPS  08/21/2015,5:55 AM

## 2015-08-21 NOTE — Progress Notes (Signed)
Patient is profoundly lethargic and difficult to arouse with affected speech. She is currently unable to follow commands and is immobile in bed.  Currently, the patient is experiencing episodes of hyperventilation with respiratory rates in the high 40s, confirmed by bedside count. Other vital signs are grossly unchanged from previous, historic values for this admission: Filed Vitals:   08/21/15 2131 08/21/15 2136 08/21/15 2212 08/21/15 2215  BP:  100/46    Pulse:  105    Temp:  99.9 F (37.7 C)    TempSrc:  Oral    Resp: 45 33 46 44  Height:      Weight:      SpO2:  100%     Spoke with April Pugh, rapid response RN and Lenny Pastel, NP for [facility]. Orders received for ABG, lactic acid and Kinaire bed to prevent pressure ulcers.  Rapid response RN to assess for further recommendations. Continuing to closely monitor.

## 2015-08-21 NOTE — Care Management Important Message (Signed)
Important Message  Patient Details  Name: Megan Vasquez MRN: [id number] Date of Birth: November 07, 1941   Medicare Important Message Given:  Yes-second notification given    Kyla Balzarine 08/21/2015, 12:08 PM

## 2015-08-21 NOTE — Progress Notes (Addendum)
PROGRESS NOTE  Britni Driscoll WUJ:[id number] DOB: 09-30-1941 DOA: 08/20/2015 PCP: [name], MD  HPI: 74 yo female patient chronic kidney disease on dialysis on Tuesday Thursday Saturday, chronic atrial fibrillation on warfarin and rate control, chronic kidney disease with anemia, mild baseline chronic thrombocytopenia (platelets are typically around 120,000), diabetes on insulin, hypertension, recent diagnosis of severe mitral regurgitation asymptomatic December 2015 per echocardiogram, hypothyroidism, dyslipidemia and known CAD. Patient was brought to the hospital today by her grandson because of altered mentation abrupt onset.According to family patient had not been eating well over the past 24 hours and this morning was not as alert as baseline. No apparent fevers or chills, no GI symptoms such as diarrhea nausea or vomiting, patient makes small amount of urine but no apparent dysuria.   10/4 CT scan on admission with concerns for pneumatosis along the cecum and ascending colon at the right lower quadrant, of uncertain significance, with mild associated wall thickening and soft tissue inflammation, ?early necrotizing enterocolitis  Subjective / 24 H Interval events - more lethargic overnight, febrile early hours - she denies pain for me however on exam she is fairly tender in her abdomen - wakes up easily but drifts back asleep  Assessment/Plan: Principal Problem:   Atrial fibrillation with RVR (HCC) Active Problems:   Long term current use of anticoagulant therapy   Diabetes mellitus, insulin dependent (IDDM), controlled (HCC)   HTN (hypertension)   CKD (chronic kidney disease) stage V requiring chronic dialysis (HCC)   HLD (hyperlipidemia)   Coronary artery disease   Severe mitral regurgitation   RLQ abdominal pain   Thrombocytopenia (HCC)   Anemia, chronic renal failure   Hypothyroidism   Metabolic encephalopathy   Sepsis (HCC)   Fever, unspecified   Leukocytosis  PAD (peripheral artery disease) (HCC)   Right foot pain   RLQ abdominal pain due to colitis - CT abdomen with pneumatosis along the cecum and ascending colon at the right lower quadrant, unclear significance, ? Ischemic colitis. Patient is tender to palpation, no guarding or rebound tenderness.   - General surgery following, appreciate input - continue Zosyn, clear liquid diet  Fever with probable early sepsis with leukocytosis, decreased mentation - overnight last night, suspect source #1, also with increased WBC - blood cultures, add Vancomycin for early sepsis - obtain CXR, abdominal film, orthopantogram for reported mouth pain however patient denies this morning  RLE pain in the setting of known PAD - foot warm, decreased pulses however very tender over 1st and 2nd toes. No open ulcers - will ask [facility] for input, discussed with [name]  Atrial fibrillation with RVR (HCC)/on anticoagulation - rates better, off of Cardizem infusion continue po only.  - Continue Metoprolol - Pharmacy managing warfarin - CHADVASc = 6  Thrombocytopenia (HCC)/Anemia, chronic renal failure - no bleeding, transfused 1U pRBC with HD on 10/6 - close monitoring  ESRD - nephrology following, HD 10/6  Metabolic encephalopathy - Patient appeared to return to baseline however more lethargic today likely in the setting of febrile illness - Consider infectious etiology-workup as above  Diabetes mellitus, insulin dependent (IDDM), controlled (HCC) - Poor oral intake so will hold Lantus insulin for now - Check CBGs and provide SSI - A1c at 7.2  HTN (hypertension) - Current blood pressure well controlled - Beta blocker and calcium channel blockers as noted above in atrial fibrillation  Severe mitral regurgitation - Discovered on echocardiogram last year - Due for repeat echocardiogram December 2016 that since admitted with  atrial fibrillation RVR  - repeat 2d echo as below, no major  changes  Hypothyroidism - TSH 16 in ER - free T4 low at 0.5 - Resume preadmission Synthroid, will not increase dose now with A fib with RVR  HLD (hyperlipidemia) - Continue Pravachol  Coronary artery disease - Currently not endorsing chest pain   Diet: Diet clear liquid Room service appropriate?: Yes; Fluid consistency:: Thin Fluids: none DVT Prophylaxis: Coumadin  Code Status: Full Code Family Communication: d/w daughter and daughter in law bedside  Disposition Plan: TBD  Consultants:  Nephrology  General surgery   Procedures:  2D echo Study Conclusions - Left ventricle: The cavity size was normal. Wall thickness wasincreased in a pattern of moderate LVH. Systolic function wasnormal. The estimated ejection fraction was in the range of 60%to 65%. The study is not technically sufficient to allowevaluation of LV diastolic function. - Aortic valve: Trileaflet; mildly calcified leaflets. There iscalcification and decreased mobility of the non-coronary cusp.There was no stenosis. There was mild regurgitation. - Mitral valve: Calcified annulus. The posterior leafelet istethered. There is moderate to severe, posteriorly directedmitral regurgitation. - Left atrium: Severely dilated. - Right ventricle: The cavity size was mildly dilated. - Right atrium: Severely dilated. - Tricuspid valve: There was moderate regurgitation. - Pulmonary arteries: PA peak pressure: 48 mm Hg (S). - Inferior vena cava: The vessel was normal in size. Therespirophasic diameter changes were in the normal range (>= 50%),consistent with normal central venous pressure.  Impressions: - Compared to the prior echo in 2015, there appear to be fewchanges.   Antibiotics Zosyn 10/5 >> Vancomycin 10/7 >>   Studies  Dg Foot 2 Views Right  08/20/2015   CLINICAL DATA:  Right foot pain for 1 day, no known injury, initial encounter  EXAM: RIGHT FOOT - 2 VIEW  COMPARISON:  None.  FINDINGS: No acute  fracture or dislocation is noted. Mild osteopenia is seen. Diffuse vascular calcifications are noted.  IMPRESSION: No acute abnormality noted.   Electronically Signed   By: Alcide Clever M.D.   On: 08/20/2015 21:08    Objective  Filed Vitals:   08/20/15 2012 08/21/15 0018 08/21/15 0523 08/21/15 0727  BP: 116/56 124/57 109/49   Pulse: 114 116 105   Temp: 100.8 F (38.2 C) 100.9 F (38.3 C) 101.1 F (38.4 C) 99 F (37.2 C)  TempSrc: Oral Oral Oral Oral  Resp: 39 38 31   Height:  5\' 7"  (1.702 m)    Weight:  67.4 kg (148 lb 9.4 oz)    SpO2: 100% 100% 100%     Intake/Output Summary (Last 24 hours) at 08/21/15 0731 Last data filed at 08/20/15 1630  Gross per 24 hour  Intake    450 ml  Output    100 ml  Net    350 ml   Filed Weights   08/19/15 0548 08/20/15 0500 08/21/15 0018  Weight: 64.365 kg (141 lb 14.4 oz) 67.405 kg (148 lb 9.6 oz) 67.4 kg (148 lb 9.4 oz)    Exam:  GENERAL: NAD, lethargic  HEENT: head NCAT, no scleral icterus. Pupils round and reactive. Mucous membranes are moist. Mouth with poor dentition, no gum swelling / abscess / drainage appreciated  NECK: Supple.   LUNGS: Clear to auscultation. No wheezing or crackles. Tachypneic  HEART: Irregular. Decreased pulses RLE, no JVD, no peripheral edema  ABDOMEN: Soft, non-distended, tender to palpation RLQ, no guarding/rebound. Positive bowel sounds.  EXTREMITIES: Without any cyanosis, clubbing, rash. Warm. 1st and 2nd right  toes tender to palpation.  NEUROLOGIC: non focal  SKIN: no ulcers, healing lesions right foot  Data Reviewed: Basic Metabolic Panel:  Recent Labs Lab September 14, 2015 1120 08/19/15 0025 08/20/15 0510 08/21/15 0315  NA 140 133* 131* 136  K 4.2 3.4* 3.8 4.3  CL 96* 92* 89* 96*  CO2 27 30 29 25   GLUCOSE 179* 218* 112* 210*  BUN 58* 24* 32* 16  CREATININE 7.23* 3.82* 5.40* 3.73*  CALCIUM 7.8* 7.1* 8.0* 8.1*   Liver Function Tests:  Recent Labs Lab 14-Sep-2015 2020 08/19/15 0025  08/20/15 0510  AST 61* 53* 64*  ALT 22 22 26   ALKPHOS 96 81 73  BILITOT 0.9 0.6 1.0  PROT 5.2* 4.5* 4.7*  ALBUMIN 1.8* 1.6* 1.6*    Recent Labs Lab 09/14/15 2020  LIPASE 17*    Recent Labs Lab September 14, 2015 2020  AMMONIA 26   CBC:  Recent Labs Lab 14-Sep-2015 1120 08/19/15 0025 08/20/15 0510 08/20/15 1945 08/21/15 0315  WBC 9.7 5.9 10.5  --  14.3*  NEUTROABS 8.9*  --   --   --   --   HGB 8.8* 8.0* 7.5* 9.0* 9.3*  HCT 29.4* 26.5* 23.3* 28.4* 29.1*  MCV 98.3 97.1 94.3  --  94.2  PLT 80* 76* 86*  --  94*   BNP (last 3 results)  Recent Labs  09-14-2015 1120  BNP 1556.0*   CBG:  Recent Labs Lab 08/20/15 0731 08/20/15 1115 08/20/15 1735 08/20/15 2017 08/21/15 0015  GLUCAP 132* 259* 127* 200* 192*    Recent Results (from the past 240 hour(s))  MRSA PCR Screening     Status: None   Collection Time: Sep 14, 2015  9:05 AM  Result Value Ref Range Status   MRSA by PCR NEGATIVE NEGATIVE Final    Comment:        The GeneXpert MRSA Assay (FDA approved for NASAL specimens only), is one component of a comprehensive MRSA colonization surveillance program. It is not intended to diagnose MRSA infection nor to guide or monitor treatment for MRSA infections.   Urine culture     Status: None   Collection Time: 2015/09/14 12:27 PM  Result Value Ref Range Status   Specimen Description URINE, CLEAN CATCH  Final   Special Requests NONE  Final   Culture MULTIPLE SPECIES PRESENT, SUGGEST RECOLLECTION  Final   Report Status 08/19/2015 FINAL  Final  Culture, blood (routine x 2)     Status: None (Preliminary result)   Collection Time: 09-14-2015  8:20 PM  Result Value Ref Range Status   Specimen Description BLOOD RIGHT WRIST  Final   Special Requests IN PEDIATRIC BOTTLE 3CC  Final   Culture NO GROWTH 2 DAYS  Final   Report Status PENDING  Incomplete  Culture, blood (routine x 2)     Status: None (Preliminary result)   Collection Time: 14-Sep-2015  8:30 PM  Result Value Ref Range  Status   Specimen Description BLOOD RIGHT HAND  Final   Special Requests IN PEDIATRIC BOTTLE 3CC  Final   Culture NO GROWTH 2 DAYS  Final   Report Status PENDING  Incomplete     Scheduled Meds: . calcium acetate  1,334 mg Oral TID WC  . calcium carbonate  1,250 mg Oral Q breakfast  . darbepoetin (ARANESP) injection - DIALYSIS  150 mcg Intravenous Q Tue-HD  . diltiazem  30 mg Oral 4 times per day  . doxercalciferol  2 mcg Intravenous Q T,Th,Sa-HD  . feeding supplement  1  Container Oral TID BM  . ferric gluconate (FERRLECIT/NULECIT) IV  125 mg Intravenous Q T,Th,Sa-HD  . insulin aspart  0-5 Units Subcutaneous QHS  . insulin aspart  0-9 Units Subcutaneous TID WC  . levothyroxine  250 mcg Oral QAC breakfast  . metoprolol succinate  25 mg Oral Daily  . multivitamin  1 tablet Oral QHS  . piperacillin-tazobactam (ZOSYN)  IV  2.25 g Intravenous 3 times per day  . pneumococcal 23 valent vaccine  0.5 mL Intramuscular Tomorrow-1000  . pravastatin  40 mg Oral q1800  . sodium chloride  3 mL Intravenous Q12H  . vancomycin  1,500 mg Intravenous Once  . [START ON 08/22/2015] vancomycin  750 mg Intravenous Q T,Th,Sa-HD  . Warfarin - Pharmacist Dosing Inpatient   Does not apply q1800   Continuous Infusions: . diltiazem (CARDIZEM) infusion Stopped (08/20/15 0955)    Pamella Pert, MD [facility] Pager [phone]. If 7 PM - 7 AM, please contact night-coverage at [email or web address], password [facility] 08/21/2015, 7:31 AM  LOS: 3 days

## 2015-08-22 ENCOUNTER — Inpatient Hospital Stay (HOSPITAL_COMMUNITY): Payer: Medicare PPO

## 2015-08-22 DIAGNOSIS — R1031 Right lower quadrant pain: Secondary | ICD-10-CM

## 2015-08-22 DIAGNOSIS — I4891 Unspecified atrial fibrillation: Secondary | ICD-10-CM

## 2015-08-22 DIAGNOSIS — Z992 Dependence on renal dialysis: Secondary | ICD-10-CM

## 2015-08-22 DIAGNOSIS — T8241XA Breakdown (mechanical) of vascular dialysis catheter, initial encounter: Secondary | ICD-10-CM | POA: Insufficient documentation

## 2015-08-22 DIAGNOSIS — A419 Sepsis, unspecified organism: Secondary | ICD-10-CM

## 2015-08-22 DIAGNOSIS — D72829 Elevated white blood cell count, unspecified: Secondary | ICD-10-CM

## 2015-08-22 DIAGNOSIS — M79671 Pain in right foot: Secondary | ICD-10-CM

## 2015-08-22 DIAGNOSIS — J9601 Acute respiratory failure with hypoxia: Secondary | ICD-10-CM

## 2015-08-22 DIAGNOSIS — E119 Type 2 diabetes mellitus without complications: Secondary | ICD-10-CM

## 2015-08-22 DIAGNOSIS — N186 End stage renal disease: Secondary | ICD-10-CM

## 2015-08-22 DIAGNOSIS — Z794 Long term (current) use of insulin: Secondary | ICD-10-CM

## 2015-08-22 LAB — GLUCOSE, CAPILLARY
GLUCOSE-CAPILLARY: 129 mg/dL — AB (ref 65–99)
Glucose-Capillary: 161 mg/dL — ABNORMAL HIGH (ref 65–99)
Glucose-Capillary: 199 mg/dL — ABNORMAL HIGH (ref 65–99)

## 2015-08-22 LAB — COMPREHENSIVE METABOLIC PANEL
ALBUMIN: 1.4 g/dL — AB (ref 3.5–5.0)
ALK PHOS: 79 U/L (ref 38–126)
ALT: 22 U/L (ref 14–54)
ANION GAP: 11 (ref 5–15)
AST: 33 U/L (ref 15–41)
BILIRUBIN TOTAL: 0.9 mg/dL (ref 0.3–1.2)
BUN: 28 mg/dL — AB (ref 6–20)
CALCIUM: 8.3 mg/dL — AB (ref 8.9–10.3)
CO2: 27 mmol/L (ref 22–32)
CREATININE: 5.09 mg/dL — AB (ref 0.44–1.00)
Chloride: 96 mmol/L — ABNORMAL LOW (ref 101–111)
GFR calc Af Amer: 9 mL/min — ABNORMAL LOW (ref >60)
GFR calc non Af Amer: 7 mL/min — ABNORMAL LOW (ref >60)
GLUCOSE: 226 mg/dL — AB (ref 65–99)
Potassium: 4.3 mmol/L (ref 3.5–5.1)
Sodium: 134 mmol/L — ABNORMAL LOW (ref 135–145)
TOTAL PROTEIN: 5.4 g/dL — AB (ref 6.5–8.1)

## 2015-08-22 LAB — CBC
HCT: 29 % — ABNORMAL LOW (ref 36.0–46.0)
HEMOGLOBIN: 9.1 g/dL — AB (ref 12.0–15.0)
MCH: 29.4 pg (ref 26.0–34.0)
MCHC: 31.4 g/dL (ref 30.0–36.0)
MCV: 93.5 fL (ref 78.0–100.0)
PLATELETS: 97 10*3/uL — AB (ref 150–400)
RBC: 3.1 MIL/uL — ABNORMAL LOW (ref 3.87–5.11)
RDW: 16.9 % — AB (ref 11.5–15.5)
WBC: 18.2 10*3/uL — ABNORMAL HIGH (ref 4.0–10.5)

## 2015-08-22 LAB — RENAL FUNCTION PANEL
Albumin: 1.5 g/dL — ABNORMAL LOW (ref 3.5–5.0)
Anion gap: 9 (ref 5–15)
BUN: 12 mg/dL (ref 6–20)
CALCIUM: 7.2 mg/dL — AB (ref 8.9–10.3)
CHLORIDE: 94 mmol/L — AB (ref 101–111)
CO2: 28 mmol/L (ref 22–32)
CREATININE: 2.82 mg/dL — AB (ref 0.44–1.00)
GFR calc Af Amer: 18 mL/min — ABNORMAL LOW (ref >60)
GFR, EST NON AFRICAN AMERICAN: 15 mL/min — AB (ref >60)
Glucose, Bld: 237 mg/dL — ABNORMAL HIGH (ref 65–99)
Phosphorus: 2.9 mg/dL (ref 2.5–4.6)
Potassium: 3.9 mmol/L (ref 3.5–5.1)
SODIUM: 131 mmol/L — AB (ref 135–145)

## 2015-08-22 LAB — PROTIME-INR
INR: 4.08 — ABNORMAL HIGH (ref 0.00–1.49)
PROTHROMBIN TIME: 38.6 s — AB (ref 11.6–15.2)

## 2015-08-22 LAB — CORTISOL: Cortisol, Plasma: 17.8 ug/dL

## 2015-08-22 LAB — PROCALCITONIN: Procalcitonin: 22.42 ng/mL

## 2015-08-22 LAB — LACTIC ACID, PLASMA
LACTIC ACID, VENOUS: 1.3 mmol/L (ref 0.5–2.0)
LACTIC ACID, VENOUS: 1.4 mmol/L (ref 0.5–2.0)
Lactic Acid, Venous: 1.1 mmol/L (ref 0.5–2.0)

## 2015-08-22 LAB — LACTATE DEHYDROGENASE: LDH: 324 U/L — ABNORMAL HIGH (ref 98–192)

## 2015-08-22 LAB — MRSA PCR SCREENING: MRSA BY PCR: NEGATIVE

## 2015-08-22 MED ORDER — VANCOMYCIN HCL IN DEXTROSE 750-5 MG/150ML-% IV SOLN
750.0000 mg | INTRAVENOUS | Status: DC
  Administered 2015-08-23 – 2015-08-24 (×2): 750 mg via INTRAVENOUS
  Filled 2015-08-22 (×2): qty 150

## 2015-08-22 MED ORDER — SODIUM CHLORIDE 0.9 % IV SOLN: 100.0000 mL | INTRAVENOUS | Status: DC | PRN

## 2015-08-22 MED ORDER — PRISMASOL BGK 4/2.5 32-4-2.5 MEQ/L IV SOLN
INTRAVENOUS | Status: DC
  Administered 2015-08-22 – 2015-08-26 (×5): via INTRAVENOUS_CENTRAL
  Filled 2015-08-22 (×10): qty 5000

## 2015-08-22 MED ORDER — SODIUM CHLORIDE 0.9 % FOR CRRT
INTRAVENOUS_CENTRAL | Status: DC | PRN
  Filled 2015-08-22: qty 1000

## 2015-08-22 MED ORDER — ALTEPLASE 2 MG IJ SOLR
2.0000 mg | Freq: Once | INTRAMUSCULAR | Status: DC | PRN
  Filled 2015-08-22: qty 2

## 2015-08-22 MED ORDER — SODIUM CHLORIDE 0.9 % IV SOLN
200.0000 mg | Freq: Once | INTRAVENOUS | Status: AC
  Administered 2015-08-22: 200 mg via INTRAVENOUS
  Filled 2015-08-22: qty 200

## 2015-08-22 MED ORDER — VANCOMYCIN HCL IN DEXTROSE 750-5 MG/150ML-% IV SOLN: 750.0000 mg | INTRAVENOUS | Status: DC

## 2015-08-22 MED ORDER — MIDODRINE HCL 5 MG PO TABS
10.0000 mg | ORAL_TABLET | Freq: Three times a day (TID) | ORAL | Status: DC
Start: 2015-08-22 — End: 2015-09-03
  Administered 2015-08-22 – 2015-09-03 (×35): 10 mg via ORAL
  Filled 2015-08-22 (×35): qty 2

## 2015-08-22 MED ORDER — LIDOCAINE HCL (PF) 1 % IJ SOLN: 5.0000 mL | INTRAMUSCULAR | Status: DC | PRN

## 2015-08-22 MED ORDER — SODIUM CHLORIDE 0.9 % IV SOLN
Freq: Once | INTRAVENOUS | Status: AC
  Administered 2015-08-22: 17:00:00 via INTRAVENOUS

## 2015-08-22 MED ORDER — SODIUM CHLORIDE 0.9 % IV SOLN
100.0000 mg | INTRAVENOUS | Status: DC
  Administered 2015-08-23: 100 mg via INTRAVENOUS
  Filled 2015-08-22 (×2): qty 100

## 2015-08-22 MED ORDER — PRISMASOL BGK 4/2.5 32-4-2.5 MEQ/L IV SOLN
INTRAVENOUS | Status: DC
  Administered 2015-08-22 – 2015-08-26 (×30): via INTRAVENOUS_CENTRAL
  Filled 2015-08-22 (×42): qty 5000

## 2015-08-22 MED ORDER — PIPERACILLIN-TAZOBACTAM IN DEX 2-0.25 GM/50ML IV SOLN
2.2500 g | Freq: Four times a day (QID) | INTRAVENOUS | Status: DC
  Administered 2015-08-22 – 2015-08-25 (×12): 2.25 g via INTRAVENOUS
  Filled 2015-08-22 (×14): qty 50

## 2015-08-22 MED ORDER — HEPARIN SODIUM (PORCINE) 1000 UNIT/ML DIALYSIS
1000.0000 [IU] | INTRAMUSCULAR | Status: DC | PRN
  Administered 2015-08-24: 3000 [IU] via INTRAVENOUS_CENTRAL
  Administered 2015-08-26: 2400 [IU] via INTRAVENOUS_CENTRAL
  Filled 2015-08-22: qty 5
  Filled 2015-08-22 (×2): qty 6
  Filled 2015-08-22: qty 3
  Filled 2015-08-22: qty 6

## 2015-08-22 MED ORDER — LIDOCAINE-PRILOCAINE 2.5-2.5 % EX CREA: 1.0000 "application " | TOPICAL_CREAM | CUTANEOUS | Status: DC | PRN

## 2015-08-22 MED ORDER — DOXERCALCIFEROL 4 MCG/2ML IV SOLN
INTRAVENOUS | Status: AC
  Administered 2015-08-22: 1 ug via INTRAVENOUS
  Filled 2015-08-22: qty 2

## 2015-08-22 MED ORDER — PENTAFLUOROPROP-TETRAFLUOROETH EX AERO: 1.0000 "application " | INHALATION_SPRAY | CUTANEOUS | Status: DC | PRN

## 2015-08-22 MED ORDER — METOPROLOL TARTRATE 12.5 MG HALF TABLET
12.5000 mg | ORAL_TABLET | Freq: Two times a day (BID) | ORAL | Status: DC
  Administered 2015-08-22: 12.5 mg via ORAL
  Filled 2015-08-22: qty 1

## 2015-08-22 MED ORDER — HEPARIN SODIUM (PORCINE) 1000 UNIT/ML DIALYSIS
1000.0000 [IU] | INTRAMUSCULAR | Status: DC | PRN
  Filled 2015-08-22: qty 1

## 2015-08-22 MED ORDER — ANIDULAFUNGIN 100 MG IV SOLR: 200.0000 mg | INTRAVENOUS | Status: DC

## 2015-08-22 MED ORDER — PRISMASOL BGK 4/2.5 32-4-2.5 MEQ/L IV SOLN
INTRAVENOUS | Status: DC
Start: 2015-08-22 — End: 2015-08-26
  Administered 2015-08-22 – 2015-08-26 (×4): via INTRAVENOUS_CENTRAL
  Filled 2015-08-22 (×5): qty 5000

## 2015-08-22 MED ORDER — DOXERCALCIFEROL 4 MCG/2ML IV SOLN
1.0000 ug | INTRAVENOUS | Status: DC
  Administered 2015-08-22 – 2015-09-01 (×2): 1 ug via INTRAVENOUS
  Filled 2015-08-22 (×8): qty 2

## 2015-08-22 NOTE — Progress Notes (Signed)
PROGRESS NOTE  Megan Vasquez ZOX:[id number] DOB: 1941-03-01 DOA: 08/24/2015 PCP: [name], MD  HPI: 74 yo female patient chronic kidney disease on dialysis on Tuesday Thursday Saturday, chronic atrial fibrillation on warfarin and rate control, chronic kidney disease with anemia, mild baseline chronic thrombocytopenia (platelets are typically around 120,000), diabetes on insulin, hypertension, recent diagnosis of severe mitral regurgitation asymptomatic December 2015 per echocardiogram, hypothyroidism, dyslipidemia and known CAD. Patient was brought to the hospital today by her grandson because of altered mentation abrupt onset.According to family patient had not been eating well over the past 24 hours and this morning was not as alert as baseline. No apparent fevers or chills, no GI symptoms such as diarrhea nausea or vomiting, patient makes small amount of urine but no apparent dysuria.   10/4 CT scan on admission with concerns for pneumatosis along the cecum and ascending colon at the right lower quadrant, of uncertain significance, with mild associated wall thickening and soft tissue inflammation, ?early necrotizing enterocolitis  10/8 patient progressively worsening with increased leukocytosis, hypotension and decreasing mentation. May need ICU care, PCCM consulted.  Subjective / 24 H Interval events - more hypotensive overnight, lethargic this morning, she wakes up when prompted and denies chest pain / abdominal pain stating she feels "fine"  Assessment/Plan: Principal Problem:   Atrial fibrillation with RVR (HCC) Active Problems:   Long term current use of anticoagulant therapy   Diabetes mellitus, insulin dependent (IDDM), controlled (HCC)   HTN (hypertension)   CKD (chronic kidney disease) stage V requiring chronic dialysis (HCC)   HLD (hyperlipidemia)   Coronary artery disease   Severe mitral regurgitation   RLQ abdominal pain   Thrombocytopenia (HCC)   Anemia,  chronic renal failure   Hypothyroidism   Metabolic encephalopathy   Sepsis (HCC)   Fever, unspecified   Leukocytosis   PAD (peripheral artery disease) (HCC)   Right foot pain   RLQ abdominal pain due to colitis - CT abdomen with pneumatosis along the cecum and ascending colon at the right lower quadrant, unclear significance, ? Ischemic colitis. Patient is tender to palpation, no guarding or rebound tenderness. She has not been complaining of any abdominal pain in the past couple of days  - General surgery following, appreciate input, continue conservative management - continue Vancomycin and Zosyn, clear liquid diet  Sepsis with leukocytosis, decreased mentation, appears to be progressing to shock with hypotension - blood cultures NGTD, currently on vancomycin and zosyn - PCCM consulted today  - she is progressively worsening clinically, increasing leukocytosis progressive hypotension  Acute hypoxic respiratory failure - in the setting of fluid overload with pulmonary edema, discussed with nephrology, unable to pull fluid off due to hypotension, needs CRRT and ICU level care. Discussed with [name], he will see in consultation.    Atrial fibrillation with RVR (HCC)/on anticoagulation - unable to give Cardizem due to hypotension, discussed with Cardiology this morning - Continue Metoprolol - Pharmacy managing warfarin - CHADVASc = 6  - add Midodrine  RLE pain in the setting of known PAD - foot warm, decreased pulses however very tender over 1st and 2nd toes. No open ulcers - not an active issue per [name]  Thrombocytopenia (HCC)/Anemia, chronic renal failure - no bleeding, transfused 1U pRBC with HD on 10/6 - stable today  ESRD - nephrology following, hypotension preventing adequate fluid removal,  Metabolic encephalopathy - mentation fluctuates with fever and blood pressure, monitor  Diabetes mellitus, insulin dependent (IDDM), controlled (HCC) - Poor oral  intake so will hold Lantus insulin for now - Check CBGs and provide SSI - A1c at 7.2  HTN (hypertension) - currently hypotensive, hold cardizem  Severe mitral regurgitation - Discovered on echocardiogram last year - Due for repeat echocardiogram December 2016 that since admitted with atrial fibrillation RVR  - repeat 2d echo as below, no major changes  Hypothyroidism - TSH 16 in ER - free T4 low at 0.5 - Resume preadmission Synthroid, will not increase dose now with A fib with RVR  HLD (hyperlipidemia) - Continue Pravachol  Coronary artery disease - Currently appears stable   Diet: Diet clear liquid Room service appropriate?: Yes; Fluid consistency:: Thin Fluids: none DVT Prophylaxis: Coumadin  Code Status: Full Code Family Communication: d/w daughter and daughter in law bedside  Disposition Plan: TBD  Consultants:  Nephrology  General surgery   Vascular Surgery  PCCM  Procedures:  2D echo Study Conclusions - Left ventricle: The cavity size was normal. Wall thickness wasincreased in a pattern of moderate LVH. Systolic function wasnormal. The estimated ejection fraction was in the range of 60%to 65%. The study is not technically sufficient to allowevaluation of LV diastolic function. - Aortic valve: Trileaflet; mildly calcified leaflets. There iscalcification and decreased mobility of the non-coronary cusp.There was no stenosis. There was mild regurgitation. - Mitral valve: Calcified annulus. The posterior leafelet istethered. There is moderate to severe, posteriorly directedmitral regurgitation. - Left atrium: Severely dilated. - Right ventricle: The cavity size was mildly dilated. - Right atrium: Severely dilated. - Tricuspid valve: There was moderate regurgitation. - Pulmonary arteries: PA peak pressure: 48 mm Hg (S). - Inferior vena cava: The vessel was normal in size. Therespirophasic diameter changes were in the normal range (>= 50%),consistent  with normal central venous pressure.  Impressions: - Compared to the prior echo in 2015, there appear to be fewchanges.   Antibiotics Zosyn 10/5 >> Vancomycin 10/7 >>   Studies  Dg Chest 1 View  08/21/2015   CLINICAL DATA:  Shortness of Breath.  Chronic renal failure  EXAM: CHEST 1 VIEW  COMPARISON:  August 18, 2015  FINDINGS: There is patchy airspace consolidation in both lower lobes, new on the right and increased on the left. There are small pleural effusions bilaterally. There is cardiomegaly with pulmonary venous hypertension. No interstitial edema is appreciable. Patient is status post coronary artery bypass grafting. No adenopathy. Bones appear somewhat sclerotic, consistent with chronic renal failure.  IMPRESSION: Evidence of a degree of congestive heart failure. Suspect superimposed pneumonia in the bases, particularly on the left. Bone show evidence of chronic renal failure.   Electronically Signed   By: Bretta Bang III M.D.   On: 08/21/2015 10:33   Dg Abd 1 View  08/21/2015   CLINICAL DATA:  Weakness and pain.  Chronic renal failure.  EXAM: ABDOMEN - 1 VIEW  COMPARISON:  CT abdomen and pelvis August 19, 2015  FINDINGS: There are multiple loops of mildly dilated small bowel. There is complex air in the at ascending colon with questionable pneumatosis in this area. Other areas of pneumatosis. No pneumoperitoneum is appreciable on this supine examination. There is contrast in the left colon and rectum. There are sigmoid and descending colonic diverticula noted. There are multiple foci of vascular calcification.  IMPRESSION: Mild small bowel dilatation. Question ileus versus enteritis. Obstruction is not felt to be likely. Note that contrast reaches the rectum.  Evidence suggesting pneumatosis in the right colon. Etiology uncertain. Ischemic change in this area cannot be excluded. Note  that this finding was also present on recent CT examination.   Electronically Signed   By: Bretta Bang III M.D.   On: 08/21/2015 10:35   Dg Foot 2 Views Right  08/20/2015   CLINICAL DATA:  Right foot pain for 1 day, no known injury, initial encounter  EXAM: RIGHT FOOT - 2 VIEW  COMPARISON:  None.  FINDINGS: No acute fracture or dislocation is noted. Mild osteopenia is seen. Diffuse vascular calcifications are noted.  IMPRESSION: No acute abnormality noted.   Electronically Signed   By: Alcide Clever M.D.   On: 08/20/2015 21:08   Objective  Filed Vitals:   08/22/15 0546 08/22/15 0645 08/22/15 0654 08/22/15 0700  BP: 105/52 96/52 94/52  91/54  Pulse: 108 105 106 115  Temp: 98.6 F (37 C) 98.9 F (37.2 C)    TempSrc: Oral Oral    Resp: 32 28    Height:      Weight: 71.033 kg (156 lb 9.6 oz) 69 kg (152 lb 1.9 oz)    SpO2: 100% 98%      Intake/Output Summary (Last 24 hours) at 08/22/15 0735 Last data filed at 08/22/15 0659  Gross per 24 hour  Intake   1380 ml  Output      2 ml  Net   1378 ml   Filed Weights   08/22/15 0500 08/22/15 0546 08/22/15 0645  Weight: 71.03 kg (156 lb 9.5 oz) 71.033 kg (156 lb 9.6 oz) 69 kg (152 lb 1.9 oz)    Exam:  GENERAL: NAD, lethargic but able to answer basic questions  HEENT: head NCAT, no scleral icterus. Pupils round and reactive. Mucous membranes are moist. Mouth with poor dentition, no gum swelling / abscess / drainage appreciated  NECK: Supple.   LUNGS: Clear to auscultation. No wheezing or crackles. Tachypneic  HEART: Irregular. Decreased pulses RLE, no JVD, no peripheral edema  ABDOMEN: Soft, non-distended, tender to palpation RLQ, no guarding/rebound. Positive bowel sounds.  EXTREMITIES: Without any cyanosis, clubbing, rash. Warm. 1st and 2nd right toes tender to palpation.  NEUROLOGIC: non focal  SKIN: no ulcers, healing lesions right foot  Data Reviewed: Basic Metabolic Panel:  Recent Labs Lab 08/31/15 1120 08/19/15 0025 08/20/15 0510 08/21/15 0315 08/22/15 0148  NA 140 133* 131* 136 134*  K 4.2 3.4* 3.8 4.3 4.3   CL 96* 92* 89* 96* 96*  CO2 GLUCOSE 179* 218* 112* 210* 226*  BUN 58* 24* 32* 16 28*  CREATININE 7.23* 3.82* 5.40* 3.73* 5.09*  CALCIUM 7.8* 7.1* 8.0* 8.1* 8.3*   Liver Function Tests:  Recent Labs Lab 08/31/15 2020 08/19/15 0025 08/20/15 0510 08/22/15 0148  AST 61* 53* 64* 33  ALT ALKPHOS 96 81 73 79  BILITOT 0.9 0.6 1.0 0.9  PROT 5.2* 4.5* 4.7* 5.4*  ALBUMIN 1.8* 1.6* 1.6* 1.4*    Recent Labs Lab 2015-08-31 2020  LIPASE 17*    Recent Labs Lab August 31, 2015 2020  AMMONIA 26   CBC:  Recent Labs Lab Aug 31, 2015 1120 08/19/15 0025 08/20/15 0510 08/20/15 1945 08/21/15 0315 08/22/15 0148  WBC 9.7 5.9 10.5  --  14.3* 18.2*  NEUTROABS 8.9*  --   --   --   --   --   HGB 8.8* 8.0* 7.5* 9.0* 9.3* 9.1*  HCT 29.4* 26.5* 23.3* 28.4* 29.1* 29.0*  MCV 98.3 97.1 94.3  --  94.2 93.5  PLT 80* 76* 86*  --  94*  97*   BNP (last 3 results)  Recent Labs  08/29/2015 1120  BNP 1556.0*   CBG:  Recent Labs Lab 08/21/15 0015 08/21/15 0725 08/21/15 1113 08/21/15 1630 08/21/15 2139  GLUCAP 192* 207* 216* 186* 155*    Recent Results (from the past 240 hour(s))  MRSA PCR Screening     Status: None   Collection Time: 08/20/2015  9:05 AM  Result Value Ref Range Status   MRSA by PCR NEGATIVE NEGATIVE Final    Comment:        The GeneXpert MRSA Assay (FDA approved for NASAL specimens only), is one component of a comprehensive MRSA colonization surveillance program. It is not intended to diagnose MRSA infection nor to guide or monitor treatment for MRSA infections.   Urine culture     Status: None   Collection Time: 09/05/2015 12:27 PM  Result Value Ref Range Status   Specimen Description URINE, CLEAN CATCH  Final   Special Requests NONE  Final   Culture MULTIPLE SPECIES PRESENT, SUGGEST RECOLLECTION  Final   Report Status 08/19/2015 FINAL  Final  Culture, blood (routine x 2)     Status: None (Preliminary result)   Collection Time: 09/08/2015   8:20 PM  Result Value Ref Range Status   Specimen Description BLOOD RIGHT WRIST  Final   Special Requests IN PEDIATRIC BOTTLE 3CC  Final   Culture NO GROWTH 3 DAYS  Final   Report Status PENDING  Incomplete  Culture, blood (routine x 2)     Status: None (Preliminary result)   Collection Time: 09/03/2015  8:30 PM  Result Value Ref Range Status   Specimen Description BLOOD RIGHT HAND  Final   Special Requests IN PEDIATRIC BOTTLE 3CC  Final   Culture NO GROWTH 3 DAYS  Final   Report Status PENDING  Incomplete     Scheduled Meds: . calcium acetate  1,334 mg Oral TID WC  . darbepoetin (ARANESP) injection - DIALYSIS  150 mcg Intravenous Q Tue-HD  . diltiazem  30 mg Oral 4 times per day  . doxercalciferol  2 mcg Intravenous Q T,Th,Sa-HD  . feeding supplement  1 Container Oral TID BM  . ferric gluconate (FERRLECIT/NULECIT) IV  125 mg Intravenous Q T,Th,Sa-HD  . insulin aspart  0-5 Units Subcutaneous QHS  . insulin aspart  0-9 Units Subcutaneous TID WC  . levothyroxine  250 mcg Oral QAC breakfast  . metoprolol succinate  25 mg Oral Daily  . multivitamin  1 tablet Oral QHS  . piperacillin-tazobactam (ZOSYN)  IV  2.25 g Intravenous 3 times per day  . pneumococcal 23 valent vaccine  0.5 mL Intramuscular Tomorrow-1000  . pravastatin  40 mg Oral q1800  . sodium chloride  3 mL Intravenous Q12H  . vancomycin  750 mg Intravenous Q T,Th,Sa-HD  . Warfarin - Pharmacist Dosing Inpatient   Does not apply q1800   Continuous Infusions: . diltiazem (CARDIZEM) infusion Stopped (08/20/15 0955)    Pamella Pert, MD [facility] Pager [phone]. If 7 PM - 7 AM, please contact night-coverage at [email or web address], password [facility] 08/22/2015, 7:35 AM  LOS: 4 days

## 2015-08-22 NOTE — Progress Notes (Signed)
ANTIBIOTIC CONSULT NOTE - INITIAL  Pharmacy Consult for vancomycin Indication: r/o necrotizing enterocolitis +/- early sepsis  No Known Allergies  Patient Measurements: Height:  (170.2 cm) Weight: 152 lb 1.9 oz (69 kg) IBW/kg (Calculated) : 61.6  Vital Signs: Temp: 98.9 F (37.2 C) (10/08 1400) Temp Source: Oral (10/08 1400) BP: 114/49 mmHg (10/08 1200) Pulse Rate: 110 (10/08 1200)  Labs:  Recent Labs  08/20/15 0510 08/20/15 1945 08/21/15 0315 08/22/15 0148  WBC 10.5  --  14.3* 18.2*  HGB 7.5* 9.0* 9.3* 9.1*  PLT 86*  --  94* 97*  CREATININE 5.40*  --  3.73* 5.09*   Estimated Creatinine Clearance: 9.1 mL/min (by C-G formula based on Cr of 5.09).   Microbiology: Recent Results (from the past 720 hour(s))  MRSA PCR Screening     Status: None   Collection Time: 09/08/2015  9:05 AM  Result Value Ref Range Status   MRSA by PCR NEGATIVE NEGATIVE Final    Comment:        The GeneXpert MRSA Assay (FDA approved for NASAL specimens only), is one component of a comprehensive MRSA colonization surveillance program. It is not intended to diagnose MRSA infection nor to guide or monitor treatment for MRSA infections.   Urine culture     Status: None   Collection Time: 09/14/2015 12:27 PM  Result Value Ref Range Status   Specimen Description URINE, CLEAN CATCH  Final   Special Requests NONE  Final   Culture MULTIPLE SPECIES PRESENT, SUGGEST RECOLLECTION  Final   Report Status 08/19/2015 FINAL  Final  Culture, blood (routine x 2)     Status: None (Preliminary result)   Collection Time: 08/17/2015  8:20 PM  Result Value Ref Range Status   Specimen Description BLOOD RIGHT WRIST  Final   Special Requests IN PEDIATRIC BOTTLE 3CC  Final   Culture NO GROWTH 4 DAYS  Final   Report Status PENDING  Incomplete  Culture, blood (routine x 2)     Status: None (Preliminary result)   Collection Time: 09/03/2015  8:30 PM  Result Value Ref Range Status   Specimen Description BLOOD  RIGHT HAND  Final   Special Requests IN PEDIATRIC BOTTLE 3CC  Final   Culture NO GROWTH 4 DAYS  Final   Report Status PENDING  Incomplete  Culture, blood (routine x 2)     Status: None (Preliminary result)   Collection Time: 08/21/15  7:50 AM  Result Value Ref Range Status   Specimen Description BLOOD HAND RIGHT  Final   Special Requests BOTTLES DRAWN AEROBIC AND ANAEROBIC 5CC  Final   Culture NO GROWTH 1 DAY  Final   Report Status PENDING  Incomplete  Culture, blood (routine x 2)     Status: None (Preliminary result)   Collection Time: 08/21/15  8:00 AM  Result Value Ref Range Status   Specimen Description BLOOD HAND RIGHT  Final   Special Requests BOTTLES DRAWN AEROBIC ONLY 10CC  Final   Culture NO GROWTH 1 DAY  Final   Report Status PENDING  Incomplete    Medical History: Past Medical History  Diagnosis Date  . Diabetes mellitus without complication (HCC)   . Hypertension   . Hyperlipidemia   . Atrial fibrillation (HCC)   . ESRD (end stage renal disease) (HCC)     HD since 06/2000  . CAD (coronary artery disease)   . 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 History of nuclear stress test 10/2012    lexiscan; normal stress test; post-stress EF72%  . Peripheral arterial disease (HCC)   . Osteoporosis   . Mitral regurgitation     severe by 2-D echocardiogram performed 10/22/14    Medications:  Prescriptions prior to admission  Medication Sig Dispense Refill Last Dose  . calcium acetate (PHOSLO) 667 MG capsule Take 1,334 mg by mouth 3 (three) times daily with meals.    08/17/2015 at Unknown time  . diltiazem (CARDIZEM CD) 240 MG 24 hr capsule TAKE ONE CAPSULE BY MOUTH DAILY 30 capsule 6 08/17/2015 at Unknown time  . insulin glargine (LANTUS) 100 UNIT/ML injection Inject 10 Units into the skin 2 (two) times daily. 10 units twice a day   08/17/2015 at  Unknown time  . levothyroxine (SYNTHROID, LEVOTHROID) 200 MCG tablet Take 200 mcg by mouth daily before breakfast. Take with to equal total dose of   08/17/2015 at Unknown time  . levothyroxine (SYNTHROID, LEVOTHROID) 50 MCG tablet Take 50 mcg by mouth daily. Take  With to equal   08/17/2015 at Unknown time  . metoprolol succinate (TOPROL-XL) 50 MG 24 hr tablet Take 1/2 tablet ( ) by mouth daily. (Patient taking differently: Take 25 mg by mouth daily. ) 30 tablet 10 08/17/2015 at Unknown time  . pravastatin (PRAVACHOL) 40 MG tablet Take 40 mg by mouth every morning.    08/17/2015 at Unknown time  . warfarin (COUMADIN) 3 MG tablet Take 4.5-6 mg by mouth daily. Take 2 tablets ( ) on Monday, and 1 1/2 (4.5MG ) tablet all other days   08/17/2015 at Unknown time  . acetaminophen (TYLENOL) 325 MG tablet Take 325 mg by mouth every 6 (six) hours as needed for moderate pain.   Past Month at Unknown time  . Calcium Carbonate (CALCIUM 600 PO) Take 1 tablet by mouth 3 (three) times daily.    Taking  . diltiazem (CARDIZEM CD) 240 MG 24 hr capsule Take 240 mg by mouth daily.   11/13/2014 at Unknown time  . lidocaine-prilocaine (EMLA) cream Apply 1 application topically Every Tuesday,Thursday,and Saturday with dialysis.   11/13/2014 at Unknown time  . metoprolol succinate (TOPROL-XL) 50 MG 24 hr tablet Take 25 mg by mouth daily. Take with or immediately following a meal.   11/13/2014 at 0700  . OVER THE COUNTER MEDICATION Apply 1 application topically Every Tuesday,Thursday,and Saturday with dialysis. Diabetic foot cream used for HD   Taking  . warfarin (COUMADIN) 3 MG tablet Take 1.5 to 2 tablets by mouth daily as directed by coumadin clinic (Patient not taking: Reported on 08/25/2015) 150 tablet 1   . warfarin (COUMADIN) 3 MG tablet TAKE 1.5 TO 2 TABLETS BY MOUTH DAILY AS DIRECTED BY COUMADIN CLINIC (Patient not taking: Reported on 08/15/2015) 50 tablet 3    Scheduled:  . sodium chloride    Intravenous Once  . [START ON 08/23/2015] anidulafungin  100 mg Intravenous Q24H  . anidulafungin  200 mg Intravenous Once  . darbepoetin (ARANESP) injection - DIALYSIS  150 mcg Intravenous Q Tue-HD  . doxercalciferol  1 mcg Intravenous Q T,Th,Sa-HD  . feeding supplement  1 Container Oral TID BM  . insulin aspart  0-5 Units Subcutaneous QHS  . insulin aspart  0-9 Units Subcutaneous TID WC  . levothyroxine  250 mcg Oral QAC breakfast  . midodrine  10 mg Oral TID  WC  . multivitamin  1 tablet Oral QHS  . piperacillin-tazobactam (ZOSYN)  IV  2.25 g Intravenous 3 times per day  . pneumococcal 23 valent vaccine  0.5 mL Intramuscular Tomorrow-1000  . pravastatin  40 mg Oral q1800  . sodium chloride  3 mL Intravenous Q12H  . vancomycin  750 mg Intravenous Q T,Th,Sa-HD   Infusions:  . dialysis replacement fluid (prismasate)    . dialysis replacement fluid (prismasate)    . dialysate (PRISMASATE)      Assessment: 74yo female admitted 10/4 w/ Afib w/ RVR and RLQ abd pain. She was started on abx for sepsis likely due to colitis. She is an ESRD pt and has been HD TTS. Her WBC has worsened. She has become unstable so CRRT is needed instead.  She got a session of HD this AM. Vanc and zosyn will need to be adjusted for this. All cultures have been neg so far.   Goal of Therapy:  Pre-HD vanc level 15-25  Plan:   Vanc 750mg  IV q24 Change zosyn to 2.25g IV q6 Anidulafungin 200mg  IV x1 the 100mg  IV q24  Ulyses Southward, PharmD Pager: [phone] 08/22/2015 2:17 PM

## 2015-08-22 NOTE — Procedures (Signed)
Central Venous Catheter Insertion Procedure Note Megan Vasquez [id number] [birth year]  Procedure: Insertion of Central Venous Catheter Indications: hd  Procedure Details Consent: Risks of procedure as well as the alternatives and risks of each were explained to the (patient/caregiver).  Consent for procedure obtained. Time Out: Verified patient identification, verified procedure, site/side was marked, verified correct patient position, special equipment/implants available, medications/allergies/relevent history reviewed, required imaging and test results available.  Performed  Maximum sterile technique was used including antiseptics, cap, gloves, gown, hand hygiene and mask. Full precaurtions Skin prep: Chlorhexidine; local anesthetic administered A antimicrobial bonded/coated triple lumen catheter was placed in the right internal jugular vein using the Seldinger technique.  Evaluation Blood flow good Complications: No apparent complications Patient did tolerate procedure well. Chest X-ray ordered to verify placement.  CXR: pending.  Nelda Bucks. 08/22/2015, 5:07 PM  For cvvhd ffp prior 2 units  less 1 cc blood loss  Mcarthur Rossetti. Tyson Alias, MD, FACP Pgr: [phone] [facility]

## 2015-08-22 NOTE — Progress Notes (Signed)
Subjective: Pt seen in HD denies abdominal pain  Objective: Vital signs in last 24 hours: Temp:  [98 F (36.7 C)-100.7 F (38.2 C)] 98.9 F (37.2 C) (10/08 0645) Pulse Rate:  [82-117] 82 (10/08 0930) Resp:  [23-46] 28 (10/08 0645) BP: (84-116)/(43-60) 84/53 mmHg (10/08 0930) SpO2:  [98 %-100 %] 98 % (10/08 0645) Weight:  [69 kg (152 lb 1.9 oz)-71.033 kg (156 lb 9.6 oz)] 69 kg (152 lb 1.9 oz) (10/08 0645) Last BM Date: 08/19/15  Intake/Output from previous day: 10/07 0701 - 10/08 0700 In: 1380 [P.O.:1160; I.V.:120; IV Piggyback:100] Out: 2 [Stool:2] Intake/Output this shift:    GI: soft, non-tender; bowel sounds normal; no masses,  no organomegaly  Lab Results:   Recent Labs  08/21/15 0315 08/22/15 0148  WBC 14.3* 18.2*  HGB 9.3* 9.1*  HCT 29.1* 29.0*  PLT 94* 97*   BMET  Recent Labs  08/21/15 0315 08/22/15 0148  NA 136 134*  K 4.3 4.3  CL 96* 96*  CO2 25 27  GLUCOSE 210* 226*  BUN 16 28*  CREATININE 3.73* 5.09*  CALCIUM 8.1* 8.3*   PT/INR  Recent Labs  08/21/15 0315 08/22/15 0148  LABPROT 33.1* 38.6*  INR 3.33* 4.08*   ABG  Recent Labs  08/21/15 2242  PHART 7.509*  HCO3 29.1*    Studies/Results: Dg Chest 1 View  08/21/2015   CLINICAL DATA:  Shortness of Breath.  Chronic renal failure  EXAM: CHEST 1 VIEW  COMPARISON:  August 18, 2015  FINDINGS: There is patchy airspace consolidation in both lower lobes, new on the right and increased on the left. There are small pleural effusions bilaterally. There is cardiomegaly with pulmonary venous hypertension. No interstitial edema is appreciable. Patient is status post coronary artery bypass grafting. No adenopathy. Bones appear somewhat sclerotic, consistent with chronic renal failure.  IMPRESSION: Evidence of a degree of congestive heart failure. Suspect superimposed pneumonia in the bases, particularly on the left. Bone show evidence of chronic renal failure.   Electronically Signed   By: Bretta Bang III M.D.   On: 08/21/2015 10:33   Dg Abd 1 View  08/21/2015   CLINICAL DATA:  Weakness and pain.  Chronic renal failure.  EXAM: ABDOMEN - 1 VIEW  COMPARISON:  CT abdomen and pelvis August 19, 2015  FINDINGS: There are multiple loops of mildly dilated small bowel. There is complex air in the at ascending colon with questionable pneumatosis in this area. Other areas of pneumatosis. No pneumoperitoneum is appreciable on this supine examination. There is contrast in the left colon and rectum. There are sigmoid and descending colonic diverticula noted. There are multiple foci of vascular calcification.  IMPRESSION: Mild small bowel dilatation. Question ileus versus enteritis. Obstruction is not felt to be likely. Note that contrast reaches the rectum.  Evidence suggesting pneumatosis in the right colon. Etiology uncertain. Ischemic change in this area cannot be excluded. Note that this finding was also present on recent CT examination.   Electronically Signed   By: Bretta Bang III M.D.   On: 08/21/2015 10:35   Dg Foot 2 Views Right  08/20/2015   CLINICAL DATA:  Right foot pain for 1 day, no known injury, initial encounter  EXAM: RIGHT FOOT - 2 VIEW  COMPARISON:  None.  FINDINGS: No acute fracture or dislocation is noted. Mild osteopenia is seen. Diffuse vascular calcifications are noted.  IMPRESSION: No acute abnormality noted.   Electronically Signed   By: Eulah Pont.D.  On: 08/20/2015 21:08    Anti-infectives: Anti-infectives    Start     Dose/Rate Route Frequency Ordered Stop   08/22/15 1200  vancomycin (VANCOCIN) IVPB 750 mg/150 ml premix     750 mg 150 mL/hr over 60 Minutes Intravenous Every T-Th-Sa (Hemodialysis) 08/21/15 0602     08/21/15 0615  vancomycin (VANCOCIN) 1,500 mg in sodium chloride 0.9 % 500 mL IVPB     1,500 mg 250 mL/hr over 120 Minutes Intravenous  Once 08/21/15 0602 08/21/15 0844   08/19/15 0600  piperacillin-tazobactam (ZOSYN) IVPB 2.25 g    Comments:  Zosyn  2.25 g IV q8h in ESRD   2.25 g 100 mL/hr over 30 Minutes Intravenous 3 times per day 08/19/15 0539        Assessment/Plan: Patient Active Problem List   Diagnosis Date Noted  . Sepsis (HCC) 08/21/2015  . Fever, unspecified 08/21/2015  . Leukocytosis 08/21/2015  . PAD (peripheral artery disease) (HCC) 08/21/2015  . Right foot pain 08/21/2015  . RLQ abdominal pain 08/29/2015  . Thrombocytopenia (HCC) 09/07/2015  . Anemia, chronic renal failure 08/19/2015  . Hypothyroidism 09/02/2015  . Metabolic encephalopathy 09/02/2015  . Severe mitral regurgitation 10/15/2014  . Coronary artery disease 10/18/2013  . Peripheral arterial disease (HCC) 10/18/2013  . Diabetes mellitus, insulin dependent (IDDM), controlled (HCC) 06/10/2013  . HTN (hypertension) 06/10/2013  . CKD (chronic kidney disease) stage V requiring chronic dialysis (HCC) 06/10/2013  . HLD (hyperlipidemia) 06/10/2013  . Atrial fibrillation with RVR (HCC) 02/01/2013  . Long term current use of anticoagulant therapy 02/01/2013  abdominal exam benign and plain film unchanged Leukocytosis may be pulmonary given CXR  but perplexing given that she denies abdominal pain and had benign exam Will follow for now   May repeat CT of abdomen and add chest CT Sunday if trend continues    LOS: 4 days    Rilya Longo A. 08/22/2015

## 2015-08-22 NOTE — Progress Notes (Addendum)
DAILY PROGRESS NOTE  Subjective:  No events overnight. Seen on dialysis. A-fib rate in the low 100's - this is permanent a-fib.  Seems volume overloaded by CXR and exam. Stares at me, but no verbal response to commands.  Objective:  Temp:  [98 F (36.7 C)-100.7 F (38.2 C)] 98.9 F (37.2 C) (10/08 0645) Pulse Rate:  [82-117] 82 (10/08 0900) Resp:  [23-46] 28 (10/08 0645) BP: (91-116)/(43-60) 106/55 mmHg (10/08 0900) SpO2:  [98 %-100 %] 98 % (10/08 0645) Weight:  [152 lb 1.9 oz (69 kg)-156 lb 9.6 oz (71.033 kg)] 152 lb 1.9 oz (69 kg) (10/08 0645) Weight change: 8 lb (3.63 kg)  Intake/Output from previous day: 10/07 0701 - 10/08 0700 In: 1380 [P.O.:1160; I.V.:120; IV Piggyback:100] Out: 2 [Stool:2]  Intake/Output from this shift:    Medications: Current Facility-Administered Medications  Medication Dose Route Frequency Provider Last Rate Last Dose  . 0.9 %  sodium chloride infusion  250 mL Intravenous PRN Samella Parr, NP      . 0.9 %  sodium chloride infusion  100 mL Intravenous PRN Roney Jaffe, MD      . 0.9 %  sodium chloride infusion  100 mL Intravenous PRN Roney Jaffe, MD      . acetaminophen (TYLENOL) tablet 650 mg  650 mg Oral Q6H PRN Samella Parr, NP   650 mg at 08/21/15 0546   Or  . acetaminophen (TYLENOL) suppository 650 mg  650 mg Rectal Q6H PRN Samella Parr, NP      . alteplase (CATHFLO ACTIVASE) injection 2 mg  2 mg Intracatheter Once PRN Roney Jaffe, MD      . calcium acetate (PHOSLO) capsule 1,334 mg  1,334 mg Oral TID WC Reyne Dumas, MD   1,334 mg at 08/21/15 1717  . Darbepoetin Alfa (ARANESP) injection 150 mcg  150 mcg Intravenous Q Tue-HD Shelle Iron, NP   150 mcg at 09/08/2015 1757  . diltiazem (CARDIZEM) 100 mg in dextrose 5 % 100 mL (1 mg/mL) infusion  5-15 mg/hr Intravenous Continuous Antony Blackbird, MD   Stopped at 08/20/15 [id number]  . diltiazem (CARDIZEM) tablet 30 mg  30 mg Oral 4 times per day Caren Griffins, MD   30 mg at 08/21/15  1717  . doxercalciferol (HECTOROL) injection 2 mcg  2 mcg Intravenous Q T,Th,Sa-HD Shelle Iron, NP   2 mcg at 08/20/15 1630  . feeding supplement (BOOST / RESOURCE BREEZE) liquid 1 Container  1 Container Oral TID BM Costin Karlyne Greenspan, MD   1 Container at 08/21/15 2110  . ferric gluconate (NULECIT) 125 mg in sodium chloride 0.9 % 100 mL IVPB  125 mg Intravenous Q T,Th,Sa-HD Shelle Iron, NP   125 mg at 08/20/15 1500  . heparin injection 1,000 Units  1,000 Units Dialysis PRN Roney Jaffe, MD      . insulin aspart (novoLOG) injection 0-5 Units  0-5 Units Subcutaneous QHS Rhetta Mura Schorr, NP   0 Units at 08/20/15 2025  . insulin aspart (novoLOG) injection 0-9 Units  0-9 Units Subcutaneous TID WC Jeryl Columbia, NP   2 Units at 08/21/15 1723  . levothyroxine (SYNTHROID, LEVOTHROID) tablet 250 mcg  250 mcg Oral QAC breakfast Reyne Dumas, MD   250 mcg at 08/21/15 0853  . lidocaine (PF) (XYLOCAINE) 1 % injection 5 mL  5 mL Intradermal PRN Roney Jaffe, MD      . lidocaine-prilocaine (EMLA) cream 1 application  1 application Topical PRN Roney Jaffe, MD      .  metoprolol succinate (TOPROL-XL) 24 hr tablet 25 mg  25 mg Oral Daily Samella Parr, NP   25 mg at 08/21/15 0853  . morphine 2 MG/ML injection 1 mg  1 mg Intravenous Q2H PRN Samella Parr, NP   1 mg at 08/20/15 1245  . multivitamin (RENA-VIT) tablet 1 tablet  1 tablet Oral QHS Shelle Iron, NP   1 tablet at 08/21/15 2110  . ondansetron (ZOFRAN) tablet 4 mg  4 mg Oral Q6H PRN Samella Parr, NP       Or  . ondansetron [facility]) injection 4 mg  4 mg Intravenous Q6H PRN Samella Parr, NP      . pentafluoroprop-tetrafluoroeth (GEBAUERS) aerosol 1 application  1 application Topical PRN Roney Jaffe, MD      . piperacillin-tazobactam (ZOSYN) IVPB 2.25 g  2.25 g Intravenous 3 times per day Reyne Dumas, MD   2.25 g at 08/21/15 2110  . pneumococcal 23 valent vaccine (PNU-IMMUNE) injection 0.5 mL  0.5 mL Intramuscular Tomorrow-1000  Reyne Dumas, MD   0.5 mL at 08/19/15 1000  . pravastatin (PRAVACHOL) tablet 40 mg  40 mg Oral q1800 Samella Parr, NP   40 mg at 08/21/15 1717  . sodium chloride 0.9 % injection 3 mL  3 mL Intravenous Q12H Samella Parr, NP   3 mL at 08/21/15 2111  . sodium chloride 0.9 % injection 3 mL  3 mL Intravenous PRN Samella Parr, NP      . vancomycin (VANCOCIN) IVPB 750 mg/150 ml premix  750 mg Intravenous Q T,Th,Sa-HD Laren Everts, RPH      . Warfarin - Pharmacist Dosing Inpatient   Does not apply q1800 Meagan A Burnett Harry, [place] at 08/20/15 1800    Physical Exam: General appearance: alert and no verbal response, stares at me Lungs: diminished breath sounds bibasilar and dullness to percussion bibasilar Heart: irregularly irregular rhythm and systolic murmur: systolic ejection 3/6, blowing and buzzing at apex Abdomen: soft, non-tender; bowel sounds normal; no masses,  no organomegaly Extremities: edema trace pedal Neurologic: Mental status: Awake, no verbal response to questions, not able to follow commands  Lab Results: Results for orders placed or performed during the hospital encounter of 09/04/2015 (from the past 48 hour(s))  Type and screen     Status: None   Collection Time: 08/20/15 10:21 AM  Result Value Ref Range   ABO/RH(D) A POS    Antibody Screen NEG    Sample Expiration 08/23/2015    Unit Number [id number]    Blood Component Type RED CELLS,LR    Unit division 00    Status of Unit ISSUED,FINAL    Transfusion Status OK TO TRANSFUSE    Crossmatch Result Compatible   Prepare RBC     Status: None   Collection Time: 08/20/15 10:21 AM  Result Value Ref Range   Order Confirmation ORDER PROCESSED BY BLOOD BANK   Glucose, capillary     Status: Abnormal   Collection Time: 08/20/15 11:15 AM  Result Value Ref Range   Glucose-Capillary 259 (H) 65 - 99 mg/dL  Glucose, capillary     Status: Abnormal   Collection Time: 08/20/15  5:35 PM  Result Value Ref Range    Glucose-Capillary 127 (H) 65 - 99 mg/dL  Hemoglobin and hematocrit, blood     Status: Abnormal   Collection Time: 08/20/15  7:45 PM  Result Value Ref Range   Hemoglobin 9.0 (L) 12.0 - 15.0 g/dL  HCT 28.4 (L) 36.0 - 46.0 %  Glucose, capillary     Status: Abnormal   Collection Time: 08/20/15  8:17 PM  Result Value Ref Range   Glucose-Capillary 200 (H) 65 - 99 mg/dL  Glucose, capillary     Status: Abnormal   Collection Time: 08/21/15 12:15 AM  Result Value Ref Range   Glucose-Capillary 192 (H) 65 - 99 mg/dL  Protime-INR     Status: Abnormal   Collection Time: 08/21/15  3:15 AM  Result Value Ref Range   Prothrombin Time 33.1 (H) 11.6 - 15.2 seconds   INR 3.33 (H) 0.00 - 6.96  Basic metabolic panel     Status: Abnormal   Collection Time: 08/21/15  3:15 AM  Result Value Ref Range   Sodium 136 135 - 145 mmol/L   Potassium 4.3 3.5 - 5.1 mmol/L   Chloride 96 (L) 101 - 111 mmol/L   CO2 25 22 - 32 mmol/L   Glucose, Bld 210 (H) 65 - 99 mg/dL   BUN 16 6 - 20 mg/dL   Creatinine, Ser 3.73 (H) 0.44 - 1.00 mg/dL   Calcium 8.1 (L) 8.9 - 10.3 mg/dL   GFR calc non Af Amer 11 (L) >60 mL/min   GFR calc Af Amer 13 (L) >60 mL/min    Comment: (NOTE) The eGFR has been calculated using the CKD EPI equation. This calculation has not been validated in all clinical situations. eGFR's persistently <60 mL/min signify possible Chronic Kidney Disease.    Anion gap 15 5 - 15  CBC     Status: Abnormal   Collection Time: 08/21/15  3:15 AM  Result Value Ref Range   WBC 14.3 (H) 4.0 - 10.5 K/uL   RBC 3.09 (L) 3.87 - 5.11 MIL/uL   Hemoglobin 9.3 (L) 12.0 - 15.0 g/dL   HCT 29.1 (L) 36.0 - 46.0 %   MCV 94.2 78.0 - 100.0 fL   MCH 30.1 26.0 - 34.0 pg   MCHC 32.0 30.0 - 36.0 g/dL   RDW 17.0 (H) 11.5 - 15.5 %   Platelets 94 (L) 150 - 400 K/uL    Comment: CONSISTENT WITH PREVIOUS RESULT  Glucose, capillary     Status: Abnormal   Collection Time: 08/21/15  7:25 AM  Result Value Ref Range   Glucose-Capillary  207 (H) 65 - 99 mg/dL  Culture, blood (routine x 2)     Status: None (Preliminary result)   Collection Time: 08/21/15  7:50 AM  Result Value Ref Range   Specimen Description BLOOD HAND RIGHT    Special Requests BOTTLES DRAWN AEROBIC AND ANAEROBIC 5CC    Culture NO GROWTH 1 DAY    Report Status PENDING   Culture, blood (routine x 2)     Status: None (Preliminary result)   Collection Time: 08/21/15  8:00 AM  Result Value Ref Range   Specimen Description BLOOD HAND RIGHT    Special Requests BOTTLES DRAWN AEROBIC ONLY 10CC    Culture NO GROWTH 1 DAY    Report Status PENDING   Glucose, capillary     Status: Abnormal   Collection Time: 08/21/15 11:13 AM  Result Value Ref Range   Glucose-Capillary 216 (H) 65 - 99 mg/dL  Glucose, capillary     Status: Abnormal   Collection Time: 08/21/15  4:30 PM  Result Value Ref Range   Glucose-Capillary 186 (H) 65 - 99 mg/dL  Glucose, capillary     Status: Abnormal   Collection Time: 08/21/15  9:39 PM  Result Value Ref Range   Glucose-Capillary 155 (H) 65 - 99 mg/dL  Blood gas, arterial     Status: Abnormal   Collection Time: 08/21/15 10:42 PM  Result Value Ref Range   O2 Content 2.0 L/min   Delivery systems NASAL CANNULA    pH, Arterial 7.509 (H) 7.350 - 7.450   pCO2 arterial 36.8 35.0 - 45.0 mmHg   pO2, Arterial 76.7 (L) 80.0 - 100.0 mmHg   Bicarbonate 29.1 (H) 20.0 - 24.0 mEq/L   TCO2 30.2 0 - 100 mmol/L   Acid-Base Excess 5.8 (H) 0.0 - 2.0 mmol/L   O2 Saturation 92.9 %   Patient temperature 98.6    Collection site LEFT RADIAL    Drawn by COLLECTED BY RT    Sample type ARTERIAL DRAW    Allens test (pass/fail) PASS PASS  Lactic acid, plasma     Status: None   Collection Time: 08/21/15 11:28 PM  Result Value Ref Range   Lactic Acid, Venous 1.3 0.5 - 2.0 mmol/L  Protime-INR     Status: Abnormal   Collection Time: 08/22/15  1:48 AM  Result Value Ref Range   Prothrombin Time 38.6 (H) 11.6 - 15.2 seconds   INR 4.08 (H) 0.00 - 1.49    Procalcitonin     Status: None   Collection Time: 08/22/15  1:48 AM  Result Value Ref Range   Procalcitonin 22.42 ng/mL    Comment:        Interpretation: PCT >= 10 ng/mL: Important systemic inflammatory response, almost exclusively due to severe bacterial sepsis or septic shock. (NOTE)         ICU PCT Algorithm               Non ICU PCT Algorithm    ----------------------------     ------------------------------         PCT < 0.25 ng/mL                 PCT < 0.1 ng/mL     Stopping of antibiotics            Stopping of antibiotics       strongly encouraged.               strongly encouraged.    ----------------------------     ------------------------------       PCT level decrease by               PCT < 0.25 ng/mL       >= 80% from peak PCT       OR PCT 0.25 - 0.5 ng/mL          Stopping of antibiotics                                             encouraged.     Stopping of antibiotics           encouraged.    ----------------------------     ------------------------------       PCT level decrease by              PCT >= 0.25 ng/mL       < 80% from peak PCT        AND PCT >= 0.5 ng/mL             Continuing antibiotics  encouraged.       Continuing antibiotics            encouraged.    ----------------------------     ------------------------------     PCT level increase compared          PCT > 0.5 ng/mL         with peak PCT AND          PCT >= 0.5 ng/mL             Escalation of antibiotics                                          strongly encouraged.      Escalation of antibiotics        strongly encouraged.   Comprehensive metabolic panel     Status: Abnormal   Collection Time: 08/22/15  1:48 AM  Result Value Ref Range   Sodium 134 (L) 135 - 145 mmol/L   Potassium 4.3 3.5 - 5.1 mmol/L   Chloride 96 (L) 101 - 111 mmol/L   CO2 27 22 - 32 mmol/L   Glucose, Bld 226 (H) 65 - 99 mg/dL   BUN 28 (H) 6 - 20 mg/dL   Creatinine,  Ser 5.09 (H) 0.44 - 1.00 mg/dL   Calcium 8.3 (L) 8.9 - 10.3 mg/dL   Total Protein 5.4 (L) 6.5 - 8.1 g/dL   Albumin 1.4 (L) 3.5 - 5.0 g/dL   AST 33 15 - 41 U/L   ALT 22 14 - 54 U/L   Alkaline Phosphatase 79 38 - 126 U/L   Total Bilirubin 0.9 0.3 - 1.2 mg/dL   GFR calc non Af Amer 7 (L) >60 mL/min   GFR calc Af Amer 9 (L) >60 mL/min    Comment: (NOTE) The eGFR has been calculated using the CKD EPI equation. This calculation has not been validated in all clinical situations. eGFR's persistently <60 mL/min signify possible Chronic Kidney Disease.    Anion gap 11 5 - 15  CBC     Status: Abnormal   Collection Time: 08/22/15  1:48 AM  Result Value Ref Range   WBC 18.2 (H) 4.0 - 10.5 K/uL   RBC 3.10 (L) 3.87 - 5.11 MIL/uL   Hemoglobin 9.1 (L) 12.0 - 15.0 g/dL   HCT 29.0 (L) 36.0 - 46.0 %   MCV 93.5 78.0 - 100.0 fL   MCH 29.4 26.0 - 34.0 pg   MCHC 31.4 30.0 - 36.0 g/dL   RDW 16.9 (H) 11.5 - 15.5 %   Platelets 97 (L) 150 - 400 K/uL    Comment: CONSISTENT WITH PREVIOUS RESULT  Lactic acid, plasma     Status: None   Collection Time: 08/22/15  1:48 AM  Result Value Ref Range   Lactic Acid, Venous 1.4 0.5 - 2.0 mmol/L    Imaging: Dg Chest 1 View  08/21/2015   CLINICAL DATA:  Shortness of Breath.  Chronic renal failure  EXAM: CHEST 1 VIEW  COMPARISON:  August 18, 2015  FINDINGS: There is patchy airspace consolidation in both lower lobes, new on the right and increased on the left. There are small pleural effusions bilaterally. There is cardiomegaly with pulmonary venous hypertension. No interstitial edema is appreciable. Patient is status post coronary artery bypass grafting. No adenopathy. Bones appear somewhat sclerotic, consistent with chronic renal failure.  IMPRESSION: Evidence of a  degree of congestive heart failure. Suspect superimposed pneumonia in the bases, particularly on the left. Bone show evidence of chronic renal failure.   Electronically Signed   By: Lowella Grip III M.D.    On: 08/21/2015 10:33   Dg Abd 1 View  08/21/2015   CLINICAL DATA:  Weakness and pain.  Chronic renal failure.  EXAM: ABDOMEN - 1 VIEW  COMPARISON:  CT abdomen and pelvis August 19, 2015  FINDINGS: There are multiple loops of mildly dilated small bowel. There is complex air in the at ascending colon with questionable pneumatosis in this area. Other areas of pneumatosis. No pneumoperitoneum is appreciable on this supine examination. There is contrast in the left colon and rectum. There are sigmoid and descending colonic diverticula noted. There are multiple foci of vascular calcification.  IMPRESSION: Mild small bowel dilatation. Question ileus versus enteritis. Obstruction is not felt to be likely. Note that contrast reaches the rectum.  Evidence suggesting pneumatosis in the right colon. Etiology uncertain. Ischemic change in this area cannot be excluded. Note that this finding was also present on recent CT examination.   Electronically Signed   By: Lowella Grip III M.D.   On: 08/21/2015 10:35   Dg Foot 2 Views Right  08/20/2015   CLINICAL DATA:  Right foot pain for 1 day, no known injury, initial encounter  EXAM: RIGHT FOOT - 2 VIEW  COMPARISON:  None.  FINDINGS: No acute fracture or dislocation is noted. Mild osteopenia is seen. Diffuse vascular calcifications are noted.  IMPRESSION: No acute abnormality noted.   Electronically Signed   By: Inez Catalina M.D.   On: 08/20/2015 21:08    Assessment:  1. Principal Problem: 2.   Atrial fibrillation with RVR ([place]) 3. Active Problems: 4.   Long term current use of anticoagulant therapy 5.   Diabetes mellitus, insulin dependent (IDDM), controlled ([place]) 6.   HTN (hypertension) 7.   CKD (chronic kidney disease) stage V requiring chronic dialysis ([place]) 8.   HLD (hyperlipidemia) 9.   Coronary artery disease 10.   Severe mitral regurgitation 11.   RLQ abdominal pain 12.   Thrombocytopenia ([place]) 13.   Anemia, chronic renal failure 14.    Hypothyroidism 15.   Metabolic encephalopathy 16.   Sepsis ([place]) 17.   Fever, unspecified 18.   Leukocytosis 19.   PAD (peripheral artery disease) ([place]) 20.   Right foot pain 21.   Plan:  1. Permanent a-fib with rate control that is adequate, given hemodynamic stress. Continue current medications. Agree with attempt to remove volume today at dialysis, it seems that she is somewhat volume overloaded.  Time Spent Directly with Patient:  15 minutes  Length of Stay:  LOS: 4 days   Pixie Casino, MD, [facility] Attending Cardiologist [place] 08/22/2015, 9:11 AM  ADDENDUM:  Concerns about low blood pressure, inability to take off extra volume at HD. She is permanent a-fib - amiodarone would essentially act as a b-blocker and can cause hypotension. She is not getting diltiazem, so I will d/c that. Switch to po metoprolol - OK if rate is below 130. Agree with midodrine if needed to increase BP for ultrafiltration although this could be early sepsis - need to be mindful as to whether pressors will be necessary - white blood cell count is rising.  Pixie Casino, MD, [facility] Attending Cardiologist [place]

## 2015-08-22 NOTE — Progress Notes (Signed)
Subjective:    Says she is feeling better today- knows she is at [facility] and on HD. No complaints  Objective Filed Vitals:   08/22/15 0730 08/22/15 0800 08/22/15 0830 08/22/15 0900  BP: 106/55  Pulse: 109 107 117 82  Temp:      TempSrc:      Resp:      Height:      Weight:      SpO2:       Physical Exam General: chronically ill appearing. Slow to respond but does answer questions appropriately. No acute distress.  Heart: irreg/irreg 2/6 sys murmur Lungs: CTA, unlabored  Abdomen: soft, nontender +BS Extremities: no edema  Dialysis Access:  L AVF patent on HD  OP HD = 4 hrs 65.5kgs 2K/2Ca L AVF 1000u hep hectorol 2 venofor 100 q HD until 10/8 then 50 q week Micera 100 q2 weeks - to start 10/6  Assessment/Plan: 1 Abd pain/ pneumatosis of colon by CT/Ischemic Colitis currently manage with Conservative Management poss abx and IVF's for now/ Abdominal x-ray obtained This am xray showspneumatosis in the right colon, also questionable ileus versus enteritis- Vanc started for increasing WBC/ temp 100.7 yestrerday 2.ESRD- HD TTS- HD today running even  3 Afib on MTP po and now po qid diltiazem/ Cardiology seeing  4 Hypotension - SBP 90s-100s- limiting fluid removal in HD 5 Volume -3.5kg over edw per bed wt Volume is up from IVF's given for low BP's and CXR yest w new CHF/ edema. She is not real symptomatic and SaO2 good for now but is at risk of decompensating. Will need to look into an alternative to MTP/ diltiazem (i.e. Amio) for afib rate control as BP's too low to remove fluid. Will start midodrine and plan extra HD tomorrow for volume. If decompensates will need ICU/ CRRT.  Have d/w primary.  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 Decrease hectorol  Jetty Duhamel, NP [facility] Beeper [phone] 08/22/2015,9:17 AM  LOS: 4 days   Pt  seen, examined, agree w assess/plan as above with additions as indicated.  Vinson Moselle MD pager [phone]    cell [phone] 08/22/2015, 10:34 AM     Additional Objective Labs: Basic Metabolic Panel:  Recent Labs Lab 08/20/15 0510 08/21/15 0315 08/22/15 0148  NA 131* 136 134*  K 3.8 4.3 4.3  CL 89* 96* 96*  CO2 GLUCOSE 112* 210* 226*  BUN 32* 16 28*  CREATININE 5.40* 3.73* 5.09*  CALCIUM 8.0* 8.1* 8.3*   Liver Function Tests:  Recent Labs Lab 08/19/15 0025 08/20/15 0510 08/22/15 0148  AST 53* 64* 33  ALT ALKPHOS 81 73 79  BILITOT 0.6 1.0 0.9  PROT 4.5* 4.7* 5.4*  ALBUMIN 1.6* 1.6* 1.4*    Recent Labs Lab 20-Aug-2015 2020  LIPASE 17*   CBC:  Recent Labs Lab 20-Aug-2015 1120 08/19/15 0025 08/20/15 0510 08/20/15 1945 08/21/15 0315 08/22/15 0148  WBC 9.7 5.9 10.5  --  14.3* 18.2*  NEUTROABS 8.9*  --   --   --   --   --   HGB 8.8* 8.0* 7.5* 9.0* 9.3* 9.1*  HCT 29.4* 26.5* 23.3* 28.4* 29.1* 29.0*  MCV 98.3 97.1 94.3  --  94.2 93.5  PLT 80* 76* 86*  --  94* 97*   Blood Culture  Component Value Date/Time   SDES BLOOD HAND RIGHT 08/21/2015 0800   SPECREQUEST BOTTLES DRAWN AEROBIC ONLY 10CC 08/21/2015 0800   CULT NO GROWTH 1 DAY 08/21/2015 0800   REPTSTATUS PENDING 08/21/2015 0800    Cardiac Enzymes: No results for input(s): CKTOTAL, CKMB, CKMBINDEX, TROPONINI in the last 168 hours. CBG:  Recent Labs Lab 08/21/15 0015 08/21/15 0725 08/21/15 1113 08/21/15 1630 08/21/15 2139  GLUCAP 192* 207* 216* 186* 155*   Iron Studies: No results for input(s): IRON, TIBC, TRANSFERRIN, FERRITIN in the last 72 hours. @lablastinr3 @ Studies/Results: Dg Chest 1 View  08/21/2015   CLINICAL DATA:  Shortness of Breath.  Chronic renal failure  EXAM: CHEST 1 VIEW  COMPARISON:  August 18, 2015  FINDINGS: There is patchy airspace consolidation in both lower lobes, new on the right and increased on the left. There are small pleural effusions  bilaterally. There is cardiomegaly with pulmonary venous hypertension. No interstitial edema is appreciable. Patient is status post coronary artery bypass grafting. No adenopathy. Bones appear somewhat sclerotic, consistent with chronic renal failure.  IMPRESSION: Evidence of a degree of congestive heart failure. Suspect superimposed pneumonia in the bases, particularly on the left. Bone show evidence of chronic renal failure.   Electronically Signed   By: Bretta Bang III M.D.   On: 08/21/2015 10:33   Dg Abd 1 View  08/21/2015   CLINICAL DATA:  Weakness and pain.  Chronic renal failure.  EXAM: ABDOMEN - 1 VIEW  COMPARISON:  CT abdomen and pelvis August 19, 2015  FINDINGS: There are multiple loops of mildly dilated small bowel. There is complex air in the at ascending colon with questionable pneumatosis in this area. Other areas of pneumatosis. No pneumoperitoneum is appreciable on this supine examination. There is contrast in the left colon and rectum. There are sigmoid and descending colonic diverticula noted. There are multiple foci of vascular calcification.  IMPRESSION: Mild small bowel dilatation. Question ileus versus enteritis. Obstruction is not felt to be likely. Note that contrast reaches the rectum.  Evidence suggesting pneumatosis in the right colon. Etiology uncertain. Ischemic change in this area cannot be excluded. Note that this finding was also present on recent CT examination.   Electronically Signed   By: Bretta Bang III M.D.   On: 08/21/2015 10:35   Dg Foot 2 Views Right  08/20/2015   CLINICAL DATA:  Right foot pain for 1 day, no known injury, initial encounter  EXAM: RIGHT FOOT - 2 VIEW  COMPARISON:  None.  FINDINGS: No acute fracture or dislocation is noted. Mild osteopenia is seen. Diffuse vascular calcifications are noted.  IMPRESSION: No acute abnormality noted.   Electronically Signed   By: Alcide Clever M.D.   On: 08/20/2015 21:08   Medications: . diltiazem (CARDIZEM)  infusion Stopped (08/20/15 0955)   . calcium acetate  1,334 mg Oral TID WC  . darbepoetin (ARANESP) injection - DIALYSIS  150 mcg Intravenous Q Tue-HD  . diltiazem  30 mg Oral 4 times per day  . doxercalciferol  2 mcg Intravenous Q T,Th,Sa-HD  . feeding supplement  1 Container Oral TID BM  . ferric gluconate (FERRLECIT/NULECIT) IV  125 mg Intravenous Q T,Th,Sa-HD  . insulin aspart  0-5 Units Subcutaneous QHS  . insulin aspart  0-9 Units Subcutaneous TID WC  . levothyroxine  250 mcg Oral QAC breakfast  . metoprolol succinate  25 mg Oral Daily  . multivitamin  1 tablet Oral QHS  . piperacillin-tazobactam (ZOSYN)  IV  2.25 g  Intravenous 3 times per day  . pneumococcal 23 valent vaccine  0.5 mL Intramuscular Tomorrow-1000  . pravastatin  40 mg Oral q1800  . sodium chloride  3 mL Intravenous Q12H  . vancomycin  750 mg Intravenous Q T,Th,Sa-HD  . Warfarin - Pharmacist Dosing Inpatient   Does not apply [phone]

## 2015-08-22 NOTE — Progress Notes (Signed)
Patient received on the floor at about 1500.  1630: patient receive two units of FFP. The third unit was not given per MD order. Because of overload.  1730: Assist MD to insert Hemodialysis cath.  patient tolerated procedures excellently. Patient is stable and will be started on CRRT. Will continue to monitor.

## 2015-08-22 NOTE — Consult Note (Signed)
PULMONARY / CRITICAL CARE MEDICINE   Name: Megan Vasquez MRN: [id number] DOB: February 28, 1941    ADMISSION DATE:  2015/08/26 CONSULTATION DATE:  10/8  REFERRING MD :  Elvera Lennox   CHIEF COMPLAINT:  Hypotension   INITIAL PRESENTATION:  74 year old female w/ ESRD, DM, and CAF admitted 10/4 w/ infectious vs ischemic colitis. Treated conservatively w/ IVFs and abx. MS and BP worse on 10/8 w/ hypotension noted during HD so PCCM asked to assess.   STUDIES:  10/4 CT abd: 1. Apparent pneumatosis along the cecum and ascending colon at the right lower quadrant, of uncertain significance, with mild associated wall thickening and soft tissue inflammation. Though this could be benign, early changes of necrotizing enterocolitis cannot be excluded. Would correlate with the patient's symptoms. 2. Small amount of slightly complex ascites in the abdomen and pelvis, predominantly on the right. 3. Trace bilateral pleural effusions, with bibasilar atelectasis and Scarring. 4. Cardiomegaly. Diffuse coronary artery calcifications seen. 5. Bilateral renal atrophy noted. 6. Scattered calcification along the abdominal aorta and its branches, including along the renal arteries and superior mesenteric artery. 7. Calcified uterine fibroids seen. ECHO 10/5: moderate LVH. Systolic function wasnormal. The estimated ejection fraction was in the range of 60%to 65%. The study is not technically sufficient to allow evaluation of LV diastolic function. - Aortic valve: Trileaflet; mildly calcified leaflets. There iscalcification and decreased mobility of the non-coronary cusp. There was mild regurgitation. - Mitral valve: Calcified annulus. The posterior leafelet is tethered. There is moderate to severe, posteriorly directed mitral regurgitation. - Left atrium: Severely dilated. - Right ventricle: The cavity size was mildly dilated. - Right atrium: Severely dilated. - Tricuspid valve: There was moderate regurgitation. -  Pulmonary arteries: PA peak pressure: 48 mm Hg (S).  SIGNIFICANT EVENTS:    HISTORY OF PRESENT ILLNESS:   74 yo female patient chronic kidney disease on dialysis on Tuesday Thursday Saturday, chronic atrial fibrillation on warfarin and rate control, chronic kidney disease with anemia, mild baseline chronic thrombocytopenia (platelets are typically around 120,000), diabetes on insulin, hypertension, recent diagnosis of severe mitral regurgitation asymptomatic December 2015 per echocardiogram, hypothyroidism, dyslipidemia and known CAD. Patient was brought to the hospital on 10/4 due to abrupt onset of altered mentation. 10/4 CT scan on admission with concerns for pneumatosis along the cecum and ascending colon at the right lower quadrant, of uncertain significance, with mild associated wall thickening and soft tissue inflammation, ?early necrotizing enterocolitis. Surgery following. Abx started. Hoping to avoid surgery. On 10/8:  patient progressively worsening with increased leukocytosis, hypotension and decreasing mentation. PCCM called to a/w care  PAST MEDICAL HISTORY :   has a past medical history of Diabetes mellitus without complication (HCC); Hypertension; Hyperlipidemia; Atrial fibrillation (HCC); ESRD (end stage renal disease) (HCC); CAD (coronary artery disease); S/P CABG (coronary artery bypass graft) (01/18/2006); History of nuclear stress test (10/2012); Peripheral arterial disease (HCC); Osteoporosis; and Mitral regurgitation.  has past surgical history that includes AV fistula placement (2007); transthoracic echocardiogram (12/2010); Lower Extremity Arterial Doppler (10/02/2013); Cardiac catheterization (12/19/2005); Coronary artery bypass graft (01/18/2006); Carotid Doppler (02/24/2009); and abdominal aortagram (N/A, 03/24/2014). Prior to Admission medications   Medication Sig Start Date End Date Taking? Authorizing Provider  calcium acetate (PHOSLO) 667 MG capsule Take 1,334 mg by mouth 3  (three) times daily with meals.  12/21/13  Yes Historical Provider, MD  diltiazem (CARDIZEM CD) 240 MG 24 hr capsule TAKE ONE CAPSULE BY MOUTH DAILY 05/19/15  Yes Runell Gess, MD  insulin glargine (LANTUS)  100 UNIT/ML injection Inject 10 Units into the skin 2 (two) times daily. 10 units twice a day   Yes Historical Provider, MD  levothyroxine (SYNTHROID, LEVOTHROID) 200 MCG tablet Take 200 mcg by mouth daily before breakfast. Take with to equal total dose of   Yes Historical Provider, MD  levothyroxine (SYNTHROID, LEVOTHROID) 50 MCG tablet Take 50 mcg by mouth daily. Take  With to equal 10/14/14  Yes Historical Provider, MD  metoprolol succinate (TOPROL-XL) 50 MG 24 hr tablet Take 1/2 tablet ( ) by mouth daily. Patient taking differently: Take 25 mg by mouth daily.  11/17/14  Yes Runell Gess, MD  pravastatin (PRAVACHOL) 40 MG tablet Take 40 mg by mouth every morning.    Yes Historical Provider, MD  warfarin (COUMADIN) 3 MG tablet Take 4.5-6 mg by mouth daily. Take 2 tablets ( ) on Monday, and 1 1/2 (4.5MG ) tablet all other days   Yes Historical Provider, MD  acetaminophen (TYLENOL) 325 MG tablet Take 325 mg by mouth every 6 (six) hours as needed for moderate pain.    Historical Provider, MD  Calcium Carbonate (CALCIUM 600 PO) Take 1 tablet by mouth 3 (three) times daily.     Historical Provider, MD  diltiazem (CARDIZEM CD) 240 MG 24 hr capsule Take 240 mg by mouth daily.    Historical Provider, MD  lidocaine-prilocaine (EMLA) cream Apply 1 application topically Every Tuesday,Thursday,and Saturday with dialysis.    Historical Provider, MD  metoprolol succinate (TOPROL-XL) 50 MG 24 hr tablet Take 25 mg by mouth daily. Take with or immediately following a meal.    Historical Provider, MD  OVER THE COUNTER MEDICATION Apply 1 application topically Every Tuesday,Thursday,and Saturday with dialysis. Diabetic foot cream used for HD    Historical Provider, MD  warfarin  (COUMADIN) 3 MG tablet Take 1.5 to 2 tablets by mouth daily as directed by coumadin clinic Patient not taking: Reported on 08/17/2015 01/05/15   Lennette Bihari, MD  warfarin (COUMADIN) 3 MG tablet TAKE 1.5 TO 2 TABLETS BY MOUTH DAILY AS DIRECTED BY COUMADIN CLINIC Patient not taking: Reported on 08/28/2015 08/17/15   Runell Gess, MD   No Known Allergies  FAMILY HISTORY:  has no family status information on file.  SOCIAL HISTORY:  reports that she quit smoking about 6 years ago. She has never used smokeless tobacco. She reports that she does not drink alcohol or use illicit drugs.  REVIEW OF SYSTEMS:   Not able   SUBJECTIVE:  No distress.  VITAL SIGNS: Temp:  [97.7 F (36.5 C)-100.7 F (38.2 C)] 97.7 F (36.5 C) (10/08 1054) Pulse Rate:  [68-120] 110 (10/08 1200) Resp:  [23-46] 25 (10/08 1054) BP: (84-116)/(43-64) 114/49 mmHg (10/08 1200) SpO2:  [98 %-100 %] 98 % (10/08 0645) Weight:  [69 kg (152 lb 1.9 oz)-71.033 kg (156 lb 9.6 oz)] 69 kg (152 lb 1.9 oz) (10/08 1054) HEMODYNAMICS:   VENTILATOR SETTINGS:   INTAKE / OUTPUT:  Intake/Output Summary (Last 24 hours) at 08/22/15 1327 Last data filed at 08/22/15 1054  Gross per 24 hour  Intake    540 ml  Output      2 ml  Net    538 ml    PHYSICAL EXAMINATION: General:  Chronically ill appearing 74 year old female, not in acute distress.  Neuro:  Awake, currently oriented but LOC will drp w/ hypotension HEENT:  NCAT, poor dentition. No JVD  Cardiovascular:  rrr Lungs:  Crackles, R>L Abdomen:  Soft, non-tender + bowel sounds  Musculoskeletal:  Intact  Skin:  Dry and intact   LABS:  CBC  Recent Labs Lab 08/20/15 0510 08/20/15 1945 08/21/15 0315 08/22/15 0148  WBC 10.5  --  14.3* 18.2*  HGB 7.5* 9.0* 9.3* 9.1*  HCT 23.3* 28.4* 29.1* 29.0*  PLT 86*  --  94* 97*   Coag's  Recent Labs Lab 08/20/15 0510 08/21/15 0315 08/22/15 0148  INR 3.30* 3.33* 4.08*   BMET  Recent Labs Lab 08/20/15 0510  08/21/15 0315 08/22/15 0148  NA 131* 136 134*  K 3.8 4.3 4.3  CL 89* 96* 96*  CO2 BUN 32* 16 28*  CREATININE 5.40* 3.73* 5.09*  GLUCOSE 112* 210* 226*   Electrolytes  Recent Labs Lab 08/20/15 0510 08/21/15 0315 08/22/15 0148  CALCIUM 8.0* 8.1* 8.3*   Sepsis Markers  Recent Labs Lab 09/05/2015 2020  08/20/15 0510 08/21/15 2328 08/22/15 0148  LATICACIDVEN 2.3*  < > 1.8 1.3 1.4  PROCALCITON 52.76  --  35.22  --  22.42  < > = values in this interval not displayed. ABG  Recent Labs Lab 08/21/15 2242  PHART 7.509*  PCO2ART 36.8  PO2ART 76.7*   Liver Enzymes  Recent Labs Lab 08/19/15 0025 08/20/15 0510 08/22/15 0148  AST 53* 64* 33  ALT ALKPHOS 81 73 79  BILITOT 0.6 1.0 0.9  ALBUMIN 1.6* 1.6* 1.4*   Cardiac Enzymes No results for input(s): TROPONINI, PROBNP in the last 168 hours. Glucose  Recent Labs Lab 08/21/15 0015 08/21/15 0725 08/21/15 1113 08/21/15 1630 08/21/15 2139 08/22/15 1149  GLUCAP 192* 207* 216* 186* 155* 129*    Imaging No results found.  PCXR w/ progressive R>L edema   ASSESSMENT / PLAN:  PULMONARY OETT A: Atelectasis  P:   Pulse ox F/u cxr am O2 as indicated   CARDIOVASCULAR CVL A:  AF w/ RVR CHADVASc = 6  Severe sepsis; intermittent shock a/w HD and volume removal  H/o PAD Progressive pulmonary edema  Mod to severe MR Mild PAH Cortisol is pending Suspect HD driving the hypotension d/t fluid shifts. Currently BP good and LA <2 P:  Move to ICU HD via CRRT F/u cortisol Stress dose steroids in cortisol <20 Hold antihypertensives.  Agree w. Midodrine  Holding anticoagulation   RENAL A:   ESRD Baseline dry weight is 65.5kgs. : currently 3.5kgs over dry weight  P:   For CRRT  GASTROINTESTINAL A:   RLQ abd pain/colitis ? Ischemic vs infective. Abd exam benign  Protein calorie malnutrition  P:   NPO For repeat CT abd tomorrow  Surgery following  See ID section  HEMATOLOGIC A:    No acute bleeding Mild thrombocytopenia   Coumadin induced coagulopathy P:  Trend cbc PAS FFP prior to HD cath  INFECTIOUS A:   Infectious vs ischemic Colitis  P:   vanc 10/6>>> Zosyn 10/4>>> eraxis 10/8>>> BCX2 10/7>>>  ENDOCRINE A:   DM Hypothyroidism  P:   ssi Cont synthroid  NEUROLOGIC A:  Mild acute encephalopathy  P:   RASS goal: 0 Hold sedation    FAMILY  - Updates:   - Inter-disciplinary family meet or Palliative Care meeting due by:  10/15   TODAY'S SUMMARY:  Sepsis in setting of ischemic vs infectious colitis. WBC rising. Hypotensive during HD but CXR w/ increased edema. Needs HD, but can't tolerate. Plan is to place temp HD cath, hold all antihypertensives and start CRRT. Surgery planning  on repeat CT 10/9, may need surgery yet.   Simonne Martinet ACNP-BC [facility] Pulmonary/Critical Care Pager # [phone] OR # [phone] if no answer     08/22/2015, 1:27 PM   STAFF NOTE: I, Rory Percy, MD FACP have personally reviewed patient's available data, including medical history, events of note, physical examination and test results as part of my evaluation. I have discussed with resident/NP and other care providers such as pharmacist, RN and RRT. In addition, I personally evaluated patient and elicited key findings of: Abdomen is soft and non tender, prior CT is concerning for ischemia, surgery is following, concern is fib rvr, pulm edema and unchanged sepsis status, inr noted, would recommend to move to ICU for cvvhd this pm as we will give ffp and place HD cath for cvvhd to neg balance, may need levo for this, assess LActic, ldh assessment, may need to change fib rvr meds for rate control, may favor amio , would d/w cards prior, add empiric antifungals, agree need to consider repeat CT The patient is critically ill with multiple organ systems failure and requires high complexity decision making for assessment and support, frequent evaluation and titration  of therapies, application of advanced monitoring technologies and extensive interpretation of multiple databases.   Critical Care Time devoted to patient care services described in this note is30 Minutes. This time reflects time of care of this signee: Rory Percy, MD FACP. This critical care time does not reflect procedure time, or teaching time or supervisory time of PA/NP/Med student/Med Resident etc but could involve care discussion time. Rest per NP/medical resident whose note is outlined above and that I agree with   Mcarthur Rossetti. Tyson Alias, MD, FACP Pgr: [phone] [facility] 08/22/2015 3:13 PM  '

## 2015-08-23 ENCOUNTER — Inpatient Hospital Stay (HOSPITAL_COMMUNITY): Payer: Medicare PPO

## 2015-08-23 DIAGNOSIS — I2510 Atherosclerotic heart disease of native coronary artery without angina pectoris: Secondary | ICD-10-CM

## 2015-08-23 DIAGNOSIS — R509 Fever, unspecified: Secondary | ICD-10-CM

## 2015-08-23 DIAGNOSIS — N181 Chronic kidney disease, stage 1: Secondary | ICD-10-CM

## 2015-08-23 DIAGNOSIS — D631 Anemia in chronic kidney disease: Secondary | ICD-10-CM

## 2015-08-23 LAB — RENAL FUNCTION PANEL
ALBUMIN: 1.3 g/dL — AB (ref 3.5–5.0)
ANION GAP: 10 (ref 5–15)
Albumin: 1.3 g/dL — ABNORMAL LOW (ref 3.5–5.0)
Anion gap: 11 (ref 5–15)
BUN: 10 mg/dL (ref 6–20)
BUN: 7 mg/dL (ref 6–20)
CALCIUM: 7.2 mg/dL — AB (ref 8.9–10.3)
CHLORIDE: 96 mmol/L — AB (ref 101–111)
CO2: 28 mmol/L (ref 22–32)
CO2: 25 mmol/L (ref 22–32)
Calcium: 7.3 mg/dL — ABNORMAL LOW (ref 8.9–10.3)
Chloride: 96 mmol/L — ABNORMAL LOW (ref 101–111)
Creatinine, Ser: 2.05 mg/dL — ABNORMAL HIGH (ref 0.44–1.00)
Creatinine, Ser: 1.73 mg/dL — ABNORMAL HIGH (ref 0.44–1.00)
GFR calc Af Amer: 26 mL/min — ABNORMAL LOW (ref >60)
GFR calc non Af Amer: 27 mL/min — ABNORMAL LOW (ref >60)
GFR, EST AFRICAN AMERICAN: 32 mL/min — AB (ref >60)
GFR, EST NON AFRICAN AMERICAN: 22 mL/min — AB (ref >60)
GLUCOSE: 181 mg/dL — AB (ref 65–99)
GLUCOSE: 232 mg/dL — AB (ref 65–99)
PHOSPHORUS: 1.8 mg/dL — AB (ref 2.5–4.6)
POTASSIUM: 3.8 mmol/L (ref 3.5–5.1)
Phosphorus: 2.2 mg/dL — ABNORMAL LOW (ref 2.5–4.6)
Potassium: 4.3 mmol/L (ref 3.5–5.1)
SODIUM: 131 mmol/L — AB (ref 135–145)
Sodium: 135 mmol/L (ref 135–145)

## 2015-08-23 LAB — PREPARE FRESH FROZEN PLASMA
Unit division: 0
Unit division: 0
Unit division: 0

## 2015-08-23 LAB — APTT: aPTT: 74 seconds — ABNORMAL HIGH (ref 24–37)

## 2015-08-23 LAB — CULTURE, BLOOD (ROUTINE X 2)
Culture: NO GROWTH
Culture: NO GROWTH

## 2015-08-23 LAB — GLUCOSE, CAPILLARY
GLUCOSE-CAPILLARY: 191 mg/dL — AB (ref 65–99)
Glucose-Capillary: 162 mg/dL — ABNORMAL HIGH (ref 65–99)
Glucose-Capillary: 224 mg/dL — ABNORMAL HIGH (ref 65–99)
Glucose-Capillary: 310 mg/dL — ABNORMAL HIGH (ref 65–99)

## 2015-08-23 LAB — CBC
HCT: 29.1 % — ABNORMAL LOW (ref 36.0–46.0)
Hemoglobin: 9.2 g/dL — ABNORMAL LOW (ref 12.0–15.0)
MCH: 29.9 pg (ref 26.0–34.0)
MCHC: 31.6 g/dL (ref 30.0–36.0)
MCV: 94.5 fL (ref 78.0–100.0)
PLATELETS: 95 10*3/uL — AB (ref 150–400)
RBC: 3.08 MIL/uL — AB (ref 3.87–5.11)
RDW: 16.3 % — ABNORMAL HIGH (ref 11.5–15.5)
WBC: 15.9 10*3/uL — ABNORMAL HIGH (ref 4.0–10.5)

## 2015-08-23 LAB — PROTIME-INR
INR: 2.9 — AB (ref 0.00–1.49)
Prothrombin Time: 29.8 seconds — ABNORMAL HIGH (ref 11.6–15.2)

## 2015-08-23 LAB — MAGNESIUM: MAGNESIUM: 1.9 mg/dL (ref 1.7–2.4)

## 2015-08-23 MED ORDER — HYDROCORTISONE NA SUCCINATE PF 100 MG IJ SOLR
50.0000 mg | Freq: Four times a day (QID) | INTRAMUSCULAR | Status: DC
  Administered 2015-08-23 – 2015-08-25 (×9): 50 mg via INTRAVENOUS
  Filled 2015-08-23: qty 1
  Filled 2015-08-23: qty 2
  Filled 2015-08-23: qty 1
  Filled 2015-08-23 (×2): qty 2
  Filled 2015-08-23 (×2): qty 1
  Filled 2015-08-23: qty 2
  Filled 2015-08-23 (×2): qty 1
  Filled 2015-08-23: qty 2
  Filled 2015-08-23: qty 1
  Filled 2015-08-23: qty 2
  Filled 2015-08-23: qty 1
  Filled 2015-08-23: qty 2

## 2015-08-23 NOTE — Progress Notes (Signed)
ANTICOAGULATION CONSULT NOTE - Initial Consult  Pharmacy Consult for heparin Indication: atrial fibrillation  No Known Allergies  Patient Measurements: Height:  (170.2 cm) Weight: 153 lb 7 oz (69.6 kg) IBW/kg (Calculated) : 61.6 Heparin Dosing Weight: 69kg  Vital Signs: Temp: 97.7 F (36.5 C) (10/09 0746) Temp Source: Oral (10/09 0746) BP: 102/56 mmHg (10/09 0930)  Labs:  Recent Labs  08/20/15 1945  08/21/15 0315 08/22/15 0148 08/22/15 2102 08/23/15 0506  HGB 9.0*  --  9.3* 9.1*  --   --   HCT 28.4*  --  29.1* 29.0*  --   --   PLT  --   --  94* 97*  --   --   APTT  --   --   --   --   --  74*  LABPROT  --   --  33.1* 38.6*  --  29.8*  INR  --   --  3.33* 4.08*  --  2.90*  CREATININE  --   < > 3.73* 5.09* 2.82* 2.05*  < > = values in this interval not displayed.  Estimated Creatinine Clearance: 22.7 mL/min (by C-G formula based on Cr of 2.05).   Medical History: Past Medical History  Diagnosis Date  . Diabetes mellitus without complication (HCC)   . Hypertension   . Hyperlipidemia   . Atrial fibrillation (HCC)   . ESRD (end stage renal disease) (HCC)     HD since 06/2000  . CAD (coronary artery disease)   . 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 History of nuclear stress test 10/2012    lexiscan; normal stress test; post-stress EF72%  . Peripheral arterial disease (HCC)   . Osteoporosis   . Mitral regurgitation     severe by 2-D echocardiogram performed 10/22/14    Medications:  Scheduled:  . anidulafungin  100 mg Intravenous Q24H  . darbepoetin (ARANESP) injection - DIALYSIS  150 mcg Intravenous Q Tue-HD  . doxercalciferol  1 mcg Intravenous Q T,Th,Sa-HD  . feeding supplement  1 Container Oral TID BM  . hydrocortisone sodium succinate  50 mg Intravenous Q6H  . insulin aspart  0-5 Units Subcutaneous QHS  . insulin  aspart  0-9 Units Subcutaneous TID WC  . levothyroxine  250 mcg Oral QAC breakfast  . midodrine  10 mg Oral TID WC  . multivitamin  1 tablet Oral QHS  . piperacillin-tazobactam (ZOSYN)  IV  2.25 g Intravenous Q6H  . pneumococcal 23 valent vaccine  0.5 mL Intramuscular Tomorrow-1000  . pravastatin  40 mg Oral q1800  . sodium chloride  3 mL Intravenous Q12H  . vancomycin  750 mg Intravenous Q24H   Infusions:  . dialysis replacement fluid (prismasate) 400 mL/hr at 08/22/15 2125  . dialysis replacement fluid (prismasate) 200 mL/hr at 08/22/15 2125  . dialysate (PRISMASATE) 1,800 mL/hr at 08/23/15 1610    Assessment: 74 yo who is well known to Rx for dosing of abx. She has a hx of afib and was on coumadin PTA. Her INR has remained elevated even with FFP. Since she may need some more procedures, the decision is to use IV heparin if INR drops below 2. Last dose of coumadin was on 10/5. INR is still 2.9 today.   Goal of Therapy:  Heparin level 0.3-0.7 units/ml Monitor platelets by anticoagulation protocol: Yes  Plan:   Heparin when INR <2  Ulyses Southward, PharmD Pager: [phone] 08/23/2015 9:57 AM

## 2015-08-23 NOTE — Progress Notes (Signed)
Subjective:  -1100 cc since starting CRRT yesterday. CXR w persistent moderate CHF  Objective Filed Vitals:   08/23/15 1800 08/23/15 1815 08/23/15 1830 08/23/15 1845  BP: 108/52 133/48 102/55 103/55  Pulse:      Temp:      TempSrc:      Resp: Height:      Weight:      SpO2: 98%      Physical Exam General: stable, alert, on CRRT Heart: irreg/irreg 2/6 sys murmur Lungs: CTA, unlabored  Abdomen: soft, nontender +BS Extremities: no edema  Dialysis Access:  L AVF patent on HD  OP HD = 4 hrs 65.5kgs 2K/2Ca L AVF 1000u hep hectorol 2 venofor 100 q HD until 10/8 then 50 q week Micera 100 q2 weeks - to start 10/6  Assessment/Plan: 1 Pulm edema/ vol excess - getting some fluid off and BP has come up some. Not requiring pressors. Continue negative 50-75 cc/hr as tolerated. F/u cxr in am. Looks better. 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 Decreased hectorol  Vinson Moselle MD [facility] pager [phone]    cell [phone] 08/23/2015, 6:55 PM      Additional Objective Labs: Basic Metabolic Panel:  Recent Labs Lab 08/22/15 2102 08/23/15 0506 08/23/15 1600  NA 131* 135 131*  K 3.9 3.8 4.3  CL 94* 96* 96*  CO2 GLUCOSE 237* 181* 232*  BUN CREATININE 2.82* 2.05* 1.73*  CALCIUM 7.2* 7.3* 7.2*  PHOS 2.9 2.2* 1.8*   Liver Function Tests:  Recent Labs Lab 08/19/15 0025 08/20/15 0510 08/22/15 0148 08/22/15 2102 08/23/15 0506 08/23/15 1600  AST 53* 64* 33  --   --   --   ALT --   --   --   ALKPHOS 81 73 79  --   --   --   BILITOT 0.6 1.0 0.9  --   --   --   PROT 4.5* 4.7* 5.4*  --   --    --   ALBUMIN 1.6* 1.6* 1.4* 1.5* 1.3* 1.3*    Recent Labs Lab 08/20/2015 2020  LIPASE 17*   CBC:  Recent Labs Lab 20-Aug-2015 1120 08/19/15 0025 08/20/15 0510  08/21/15 0315 08/22/15 0148 08/23/15 1217  WBC 9.7 5.9 10.5  --  14.3* 18.2* 15.9*  NEUTROABS 8.9*  --   --   --   --   --   --   HGB 8.8* 8.0* 7.5*  < > 9.3* 9.1* 9.2*  HCT 29.4* 26.5* 23.3*  < > 29.1* 29.0* 29.1*  MCV 98.3 97.1 94.3  --  94.2 93.5 94.5  PLT 80* 76* 86*  --  94* 97* 95*  < > = values in this interval not displayed. Blood Culture    Component Value Date/Time   SDES BLOOD HAND RIGHT 08/21/2015 0800   SPECREQUEST BOTTLES DRAWN AEROBIC ONLY 10CC 08/21/2015 0800   CULT NO GROWTH 2 DAYS 08/21/2015 0800   REPTSTATUS PENDING 08/21/2015 0800    Cardiac Enzymes: No results for input(s): CKTOTAL,  CKMB, CKMBINDEX, TROPONINI in the last 168 hours. CBG:  Recent Labs Lab 08/22/15 1354 08/22/15 2225 08/23/15 0749 08/23/15 1155 08/23/15 1610  GLUCAP 161* 199* 162* 191* 224*   Iron Studies: No results for input(s): IRON, TIBC, TRANSFERRIN, FERRITIN in the last 72 hours. @ Studies/Results: Dg Chest Port 1 View  08/23/2015   CLINICAL DATA:  Follow-up of pulmonary edema.  EXAM: PORTABLE CHEST 1 VIEW  COMPARISON:  08/22/2015  FINDINGS: Right internal jugular approach central venous catheter is unchanged, with tip projecting over the expected location of mid sub superior vena cava. Postsurgical changes are stable.  Cardiomediastinal silhouette is enlarged. Mediastinal contours appear intact.  There is no evidence of pneumothorax. There is persistent prominence of interstitial markings with bilateral pleural effusions, right greater than left, and bibasilar airspace consolidation.  Osseous structures are without acute abnormality. Soft tissues are grossly normal.  IMPRESSION: Persistent appearance of pulmonary edema with bilateral pleural effusions.  Stably enlarged cardiac silhouette.   Electronically  Signed   By: Ted Mcalpine M.D.   On: 08/23/2015 09:43   Dg Chest Port 1 View  08/22/2015   CLINICAL DATA:  Bedside central venous catheter placement. Followup pulmonary edema and possible superimposed pneumonia.  EXAM: PORTABLE CHEST 1 VIEW  COMPARISON:  08/21/2015 and earlier.  FINDINGS: Right jugular central venous catheter tip projects over the mid SVC. No evidence of pneumothorax or mediastinal hematoma.  Prior sternotomy CABG. Cardiac silhouette markedly enlarged, unchanged. Mild diffuse interstitial pulmonary edema, unchanged. Consolidation in the lower lobes, unchanged. Bilateral pleural effusions, right greater than left, unchanged. No new pulmonary parenchymal abnormality.  IMPRESSION: 1. Right jugular central venous catheter tip projects over the mid SVC. No acute complicating features. 2. Stable mild CHF and/or fluid overload, bilateral pleural effusions (right greater than left) and bilateral lower lobe atelectasis and/or pneumonia. 3. No new abnormalities.   Electronically Signed   By: Hulan Saas M.D.   On: 08/22/2015 18:08   Medications: . dialysis replacement fluid (prismasate) 400 mL/hr at 08/23/15 1001  . dialysis replacement fluid (prismasate) 200 mL/hr at 08/22/15 2125  . dialysate (PRISMASATE) 1,800 mL/hr at 08/23/15 1703   . anidulafungin  100 mg Intravenous Q24H  . darbepoetin (ARANESP) injection - DIALYSIS  150 mcg Intravenous Q Tue-HD  . doxercalciferol  1 mcg Intravenous Q T,Th,Sa-HD  . feeding supplement  1 Container Oral TID BM  . hydrocortisone sodium succinate  50 mg Intravenous Q6H  . insulin aspart  0-5 Units Subcutaneous QHS  . insulin aspart  0-9 Units Subcutaneous TID WC  . levothyroxine  250 mcg Oral QAC breakfast  . midodrine  10 mg Oral TID WC  . multivitamin  1 tablet Oral QHS  . piperacillin-tazobactam (ZOSYN)  IV  2.25 g Intravenous Q6H  . pneumococcal 23 valent vaccine  0.5 mL Intramuscular Tomorrow-1000  . pravastatin  40 mg Oral q1800  .  sodium chloride  3 mL Intravenous Q12H  . vancomycin  750 mg Intravenous Q24H

## 2015-08-23 NOTE — Consult Note (Signed)
PULMONARY / CRITICAL CARE MEDICINE   Name: Megan Vasquez MRN: [id number] DOB: [birth year]    ADMISSION DATE:  08/17/2015 CONSULTATION DATE:  10/8  REFERRING MD :  Cruzita Lederer   CHIEF COMPLAINT:  Hypotension   INITIAL PRESENTATION:  74 year old female w/ ESRD, DM, and CAF admitted 10/4 w/ infectious vs ischemic colitis. Treated conservatively w/ IVFs and abx. MS and BP worse on 10/8 w/ hypotension noted during HD so PCCM asked to assess.   STUDIES:  10/4 CT abd: 1. Apparent pneumatosis along the cecum and ascending colon at the right lower quadrant, of uncertain significance, with mild associated wall thickening and soft tissue inflammation.  ECHO 10/5: moderate LVH. Systolic function wasnormal. The estimated ejection fraction was in the range of 60%to 65%.  - Left atrium: Severely dilated. - Right ventricle: The cavity size was mildly dilated. - Right atrium: Severely dilated. - Tricuspid valve: There was moderate regurgitation. - Pulmonary arteries: PA peak pressure: 48 mm Hg (S).  SIGNIFICANT EVENTS: 10/8 -  cvvhd started  SUBJECTIVE:  Tolerated cvvhd  VITAL SIGNS: Temp:  [94.2 F (34.6 C)-98.9 F (37.2 C)] 97.7 F (36.5 C) (10/09 0746) Pulse Rate:  [68-120] 110 (10/08 1200) Resp:  [17-31] 18 (10/09 0800) BP: (84-147)/(43-97) 96/43 mmHg (10/09 0800) SpO2:  [98 %-100 %] 98 % (10/09 0800) Weight:  [69 kg (152 lb 1.9 oz)-69.6 kg (153 lb 7 oz)] 69.6 kg (153 lb 7 oz) (10/09 0400) HEMODYNAMICS:   VENTILATOR SETTINGS:   INTAKE / OUTPUT:  Intake/Output Summary (Last 24 hours) at 08/23/15 0816 Last data filed at 08/23/15 0802  Gross per 24 hour  Intake    365 ml  Output    853 ml  Net   -488 ml    PHYSICAL EXAMINATION: General:  Less distress, little drowsy Neuro:  Awakens, O x 3 HEENT:  NCAT, poor dentition. No JVD, catheter in rt ij WNL  Cardiovascular:  s1 s2 ir slight tachy Lungs:  reduced Abdomen:  Soft, non-tender + bowel sounds  Musculoskeletal:  Intact   Skin:  Dry and intact   LABS:  CBC  Recent Labs Lab 08/20/15 0510 08/20/15 1945 08/21/15 0315 08/22/15 0148  WBC 10.5  --  14.3* 18.2*  HGB 7.5* 9.0* 9.3* 9.1*  HCT 23.3* 28.4* 29.1* 29.0*  PLT 86*  --  94* 97*   Coag's  Recent Labs Lab 08/21/15 0315 08/22/15 0148 08/23/15 0506  APTT  --   --  74*  INR 3.33* 4.08* 2.90*   BMET  Recent Labs Lab 08/22/15 0148 08/22/15 2102 08/23/15 0506  NA 134* 131* 135  K 4.3 3.9 3.8  CL 96* 94* 96*  CO2 27 28 28   BUN 28* 12 10  CREATININE 5.09* 2.82* 2.05*  GLUCOSE 226* 237* 181*   Electrolytes  Recent Labs Lab 08/22/15 0148 08/22/15 2102 08/23/15 0506  CALCIUM 8.3* 7.2* 7.3*  MG  --   --  1.9  PHOS  --  2.9 2.2*   Sepsis Markers  Recent Labs Lab 08/17/2015 2020  08/20/15 0510 08/21/15 2328 08/22/15 0148 08/22/15 2101  LATICACIDVEN 2.3*  < > 1.8 1.3 1.4 1.1  PROCALCITON 52.76  --  35.22  --  22.42  --   < > = values in this interval not displayed. ABG  Recent Labs Lab 08/21/15 2242  PHART 7.509*  PCO2ART 36.8  PO2ART 76.7*   Liver Enzymes  Recent Labs Lab 08/19/15 0025 08/20/15 0510 08/22/15 0148 08/22/15 2102 08/23/15 0506  AST  53* 64* 33  --   --   ALT 22 26 22   --   --   ALKPHOS 81 73 79  --   --   BILITOT 0.6 1.0 0.9  --   --   ALBUMIN 1.6* 1.6* 1.4* 1.5* 1.3*   Cardiac Enzymes No results for input(s): TROPONINI, PROBNP in the last 168 hours. Glucose  Recent Labs Lab 08/21/15 1113 08/21/15 1630 08/21/15 2139 08/22/15 1149 08/22/15 1354 08/22/15 2225  GLUCAP 216* 186* 155* 129* 161* 199*    Imaging Dg Chest Port 1 View  08/22/2015   CLINICAL DATA:  Bedside central venous catheter placement. Followup pulmonary edema and possible superimposed pneumonia.  EXAM: PORTABLE CHEST 1 VIEW  COMPARISON:  08/21/2015 and earlier.  FINDINGS: Right jugular central venous catheter tip projects over the mid SVC. No evidence of pneumothorax or mediastinal hematoma.  Prior sternotomy CABG.  Cardiac silhouette markedly enlarged, unchanged. Mild diffuse interstitial pulmonary edema, unchanged. Consolidation in the lower lobes, unchanged. Bilateral pleural effusions, right greater than left, unchanged. No new pulmonary parenchymal abnormality.  IMPRESSION: 1. Right jugular central venous catheter tip projects over the mid SVC. No acute complicating features. 2. Stable mild CHF and/or fluid overload, bilateral pleural effusions (right greater than left) and bilateral lower lobe atelectasis and/or pneumonia. 3. No new abnormalities.   Electronically Signed   By: Evangeline Dakin M.D.   On: 08/22/2015 18:08    PCXR w/ progressive R>L edema   ASSESSMENT / PLAN:  PULMONARY OETT A: Atelectasis  pulm edema effusions P:   Pulse ox to sats goal 92% Neg balance tolerated, maintain this F/u cxr am  CARDIOVASCULAR CVL A:  AF w/ RVR CHADVASc = 6  Severe sepsis; intermittent shock a/w HD and volume removal  H/o PAD Progressive pulmonary edema and effusions Mod to severe MR Mild PAH Cortisol is less 20, has rel AI P:  See heme Continue neg balance Add stress roids If needed add neo for map goal 55-60 LA re assuring  RENAL A:   ESRD Baseline dry weight is 65.5kgs. : currently 3.5kgs over dry weight  overload P:   For CRRT Maintain neg balance  GASTROINTESTINAL A:   RLQ abd pain/colitis ? Ischemic vs infective. Abd exam benign  Protein calorie malnutrition  P:   Clears, diet advance per surgery? For repeat CT abd per surgery? Wbc noted, hypothermia from cvvhd likely>? Surgery following  See ID section  HEMATOLOGIC A:   No acute bleeding Mild thrombocytopenia   Coumadin induced coagulopathy (fib, cva) P:  Trend cbc PAS INR remains there even after 2 ffp Consider heparin drip once INR less 2.0 (may need further procedures) Avoid vit K as will have coum resistance  INFECTIOUS A:   Infectious vs ischemic Colitis  P:   vanc 10/6>>> Zosyn 10/4>>> eraxis  10/8>>> BCX2 10/7>>>  Consider CT per surgery Maintain current abx Pct assessment trend good  ENDOCRINE A:   DM Hypothyroidism  P:   ssi Cont synthroid 140-180 met  NEUROLOGIC A:  Mild acute encephalopathy  P:   RASS goal: 0 Hold sedation  cvvhd Avoid benzo  FAMILY  - Updates: to pt  - Inter-disciplinary family meet or Palliative Care meeting due by:  10/15  Ccm time 30 min   Lavon Paganini. Titus Mould, MD, [place] Pgr: [facility]

## 2015-08-23 NOTE — Progress Notes (Signed)
eLink Physician-Brief Progress Note Patient Name: Megan Vasquez DOB: [birth year] MRN: [id number]   Date of Service  08/23/2015  HPI/Events of Note  Hypothermia - on blood warmer on CRRT  eICU Interventions  Apply warming blanket as needed     Intervention Category Intermediate Interventions: Other:  Rhonda Vangieson 08/23/2015, 2:21 AM

## 2015-08-23 NOTE — Progress Notes (Signed)
Patient ID: Shaleena Crusoe, female   DOB: [birth year], 74 y.o.   MRN: [id number]      Subjective:    No complaints  Objective:   Temp:  [94.2 F (34.6 C)-98.9 F (37.2 C)] 97.7 F (36.5 C) (10/09 0746) Pulse Rate:  [110-120] 110 (10/08 1200) Resp:  [17-31] 29 (10/09 1000) BP: (92-147)/(43-97) 103/48 mmHg (10/09 1000) SpO2:  [98 %-100 %] 98 % (10/09 0800) Weight:  [152 lb 1.9 oz (69 kg)-153 lb 7 oz (69.6 kg)] 153 lb 7 oz (69.6 kg) (10/09 0400) Last BM Date: 08/22/15  Filed Weights   08/22/15 0645 08/22/15 1054 08/23/15 0400  Weight: 152 lb 1.9 oz (69 kg) 152 lb 1.9 oz (69 kg) 153 lb 7 oz (69.6 kg)    Intake/Output Summary (Last 24 hours) at 08/23/15 1026 Last data filed at 08/23/15 1002  Gross per 24 hour  Intake    585 ml  Output   1278 ml  Net   -693 ml    Telemetry: afib 90s to low 100s  Exam:  General: NAD  Resp: coarse bilaterally  Cardiac: irreg, rate 100, 2/6 systolic murmur at apex, +JVD  GI: abdomen soft, NT, ND  MSK: no LE edema  Neuro: no focal deficits    Lab Results:  Basic Metabolic Panel:  Recent Labs Lab 08/22/15 0148 08/22/15 2102 08/23/15 0506  NA 134* 131* 135  K 4.3 3.9 3.8  CL 96* 94* 96*  CO2 GLUCOSE 226* 237* 181*  BUN 28* 12 10  CREATININE 5.09* 2.82* 2.05*  CALCIUM 8.3* 7.2* 7.3*  MG  --   --  1.9    Liver Function Tests:  Recent Labs Lab 08/19/15 0025 08/20/15 0510 08/22/15 0148 08/22/15 2102 08/23/15 0506  AST 53* 64* 33  --   --   ALT --   --   ALKPHOS 81 73 79  --   --   BILITOT 0.6 1.0 0.9  --   --   PROT 4.5* 4.7* 5.4*  --   --   ALBUMIN 1.6* 1.6* 1.4* 1.5* 1.3*    CBC:  Recent Labs Lab 08/20/15 0510 08/20/15 1945 08/21/15 0315 08/22/15 0148  WBC 10.5  --  14.3* 18.2*  HGB 7.5* 9.0* 9.3* 9.1*  HCT 23.3* 28.4* 29.1* 29.0*  MCV 94.3  --  94.2 93.5  PLT 86*  --  94* 97*    Cardiac Enzymes: No results for input(s): CKTOTAL, CKMB, CKMBINDEX, TROPONINI in the last 168  hours.  BNP: No results for input(s): PROBNP in the last 8760 hours.  Coagulation:  Recent Labs Lab 08/21/15 0315 08/22/15 0148 08/23/15 0506  INR 3.33* 4.08* 2.90*    ECG:   Medications:   Scheduled Medications: . anidulafungin  100 mg Intravenous Q24H  . darbepoetin (ARANESP) injection - DIALYSIS  150 mcg Intravenous Q Tue-HD  . doxercalciferol  1 mcg Intravenous Q T,Th,Sa-HD  . feeding supplement  1 Container Oral TID BM  . hydrocortisone sodium succinate  50 mg Intravenous Q6H  . insulin aspart  0-5 Units Subcutaneous QHS  . insulin aspart  0-9 Units Subcutaneous TID WC  . levothyroxine  250 mcg Oral QAC breakfast  . midodrine  10 mg Oral TID WC  . multivitamin  1 tablet Oral QHS  . piperacillin-tazobactam (ZOSYN)  IV  2.25 g Intravenous Q6H  . pneumococcal 23 valent vaccine  0.5 mL Intramuscular Tomorrow-1000  . pravastatin  40 mg Oral q1800  .  sodium chloride  3 mL Intravenous Q12H  . vancomycin  750 mg Intravenous Q24H     Infusions: . dialysis replacement fluid (prismasate) 400 mL/hr at 08/23/15 1001  . dialysis replacement fluid (prismasate) 200 mL/hr at 08/22/15 2125  . dialysate (PRISMASATE) 1,800 mL/hr at 08/23/15 0859     PRN Medications:  sodium chloride, alteplase, heparin, ondansetron **OR** ondansetron (ZOFRAN) IV, sodium chloride, sodium chloride     Assessment/Plan    1. Permanent afib - rate control has been complicated by hypotension, especially during hemodialysis. Appears lopressor was stopped yesterday due to low bp's.. Heart rates 90s to low 100s, given her medical illness rates are appropriate. If elevated rates can try amio gtt, though with her permanent afib this is more likely to act as a mild beta blocker for her as opposed to getting her back in to normal rhythm.  - INR is therapeutic   2. Moderate to Severe MR - echo 08/2015 mod to severe MR, LVEF is normal at 60-65%. PASP 48 - CXR 08/23/15 with pulmonary edema - fluid  removal with HD as tolerated, has been limited due to hypotension. On midodrine, may need consideration for pressor support HD. Has tolerated CVVHD overnight.   3. Hypotension - per primary team. Possible septic component, she has also been diagsed with relative adrenal insufficiency with plans for steroids  4. Colitis - abx per primary team     Dina Rich, M.D.

## 2015-08-23 NOTE — Progress Notes (Signed)
Subjective: Denies abdominal pain  In ICU due to [facility] but normotensive currently  Lactate normal   Objective: Vital signs in last 24 hours: Temp:  [94.2 F (34.6 C)-98.9 F (37.2 C)] 97.7 F (36.5 C) (10/09 0746) Pulse Rate:  [68-120] 110 (10/08 1200) Resp:  [17-31] 22 (10/09 0915) BP: (88-147)/(43-97) 110/62 mmHg (10/09 0915) SpO2:  [98 %-100 %] 98 % (10/09 0800) Weight:  [69 kg (152 lb 1.9 oz)-69.6 kg (153 lb 7 oz)] 69.6 kg (153 lb 7 oz) (10/09 0400) Last BM Date: 08/22/15  Intake/Output from previous day: 10/08 0701 - 10/09 0700 In: 205 [I.V.:105; IV Piggyback:100] Out: 765  Intake/Output this shift: Total I/O In: 320 [P.O.:150; I.V.:20; IV Piggyback:150] Out: 287 [Other:287]  GI: soft, non-tender; bowel sounds normal; no masses,  no organomegaly  Lab Results:   Recent Labs  08/21/15 0315 08/22/15 0148  WBC 14.3* 18.2*  HGB 9.3* 9.1*  HCT 29.1* 29.0*  PLT 94* 97*   BMET  Recent Labs  08/22/15 2102 08/23/15 0506  NA 131* 135  K 3.9 3.8  CL 94* 96*  CO2 28 28  GLUCOSE 237* 181*  BUN 12 10  CREATININE 2.82* 2.05*  CALCIUM 7.2* 7.3*   PT/INR  Recent Labs  08/22/15 0148 08/23/15 0506  LABPROT 38.6* 29.8*  INR 4.08* 2.90*   ABG  Recent Labs  08/21/15 2242  PHART 7.509*  HCO3 29.1*    Studies/Results: Dg Chest 1 View  08/21/2015   CLINICAL DATA:  Shortness of Breath.  Chronic renal failure  EXAM: CHEST 1 VIEW  COMPARISON:  September 02, 2015  FINDINGS: There is patchy airspace consolidation in both lower lobes, new on the right and increased on the left. There are small pleural effusions bilaterally. There is cardiomegaly with pulmonary venous hypertension. No interstitial edema is appreciable. Patient is status post coronary artery bypass grafting. No adenopathy. Bones appear somewhat sclerotic, consistent with chronic renal failure.  IMPRESSION: Evidence of a degree of congestive heart failure. Suspect superimposed pneumonia in the bases,  particularly on the left. Bone show evidence of chronic renal failure.   Electronically Signed   By: Bretta Bang III M.D.   On: 08/21/2015 10:33   Dg Abd 1 View  08/21/2015   CLINICAL DATA:  Weakness and pain.  Chronic renal failure.  EXAM: ABDOMEN - 1 VIEW  COMPARISON:  CT abdomen and pelvis August 19, 2015  FINDINGS: There are multiple loops of mildly dilated small bowel. There is complex air in the at ascending colon with questionable pneumatosis in this area. Other areas of pneumatosis. No pneumoperitoneum is appreciable on this supine examination. There is contrast in the left colon and rectum. There are sigmoid and descending colonic diverticula noted. There are multiple foci of vascular calcification.  IMPRESSION: Mild small bowel dilatation. Question ileus versus enteritis. Obstruction is not felt to be likely. Note that contrast reaches the rectum.  Evidence suggesting pneumatosis in the right colon. Etiology uncertain. Ischemic change in this area cannot be excluded. Note that this finding was also present on recent CT examination.   Electronically Signed   By: Bretta Bang III M.D.   On: 08/21/2015 10:35   Dg Chest Port 1 View  08/22/2015   CLINICAL DATA:  Bedside central venous catheter placement. Followup pulmonary edema and possible superimposed pneumonia.  EXAM: PORTABLE CHEST 1 VIEW  COMPARISON:  08/21/2015 and earlier.  FINDINGS: Right jugular central venous catheter tip projects over the mid SVC. No evidence of pneumothorax or  mediastinal hematoma.  Prior sternotomy CABG. Cardiac silhouette markedly enlarged, unchanged. Mild diffuse interstitial pulmonary edema, unchanged. Consolidation in the lower lobes, unchanged. Bilateral pleural effusions, right greater than left, unchanged. No new pulmonary parenchymal abnormality.  IMPRESSION: 1. Right jugular central venous catheter tip projects over the mid SVC. No acute complicating features. 2. Stable mild CHF and/or fluid overload,  bilateral pleural effusions (right greater than left) and bilateral lower lobe atelectasis and/or pneumonia. 3. No new abnormalities.   Electronically Signed   By: Hulan Saas M.D.   On: 08/22/2015 18:08    Anti-infectives: Anti-infectives    Start     Dose/Rate Route Frequency Ordered Stop   08/23/15 1800  vancomycin (VANCOCIN) IVPB 750 mg/150 ml premix  Status:  Discontinued     750 mg 150 mL/hr over 60 Minutes Intravenous Every 24 hours 08/22/15 1419 08/22/15 1422   08/23/15 1300  anidulafungin (ERAXIS) 100 mg in sodium chloride 0.9 % 100 mL IVPB     100 mg over 90 Minutes Intravenous Every 24 hours 08/22/15 1236     08/23/15 0800  vancomycin (VANCOCIN) IVPB 750 mg/150 ml premix     750 mg 150 mL/hr over 60 Minutes Intravenous Every 24 hours 08/22/15 1422     08/22/15 1600  piperacillin-tazobactam (ZOSYN) IVPB 2.25 g     2.25 g 100 mL/hr over 30 Minutes Intravenous Every 6 hours 08/22/15 1419     08/22/15 1300  anidulafungin (ERAXIS) 200 mg in sodium chloride 0.9 % 200 mL IVPB     200 mg over 180 Minutes Intravenous  Once 08/22/15 1234 08/22/15 2029   08/22/15 1215  anidulafungin (ERAXIS) 200 mg in sodium chloride 0.9 % 200 mL IVPB  Status:  Discontinued     200 mg over 180 Minutes Intravenous Every 24 hours 08/22/15 1208 08/22/15 1234   08/22/15 1200  vancomycin (VANCOCIN) IVPB 750 mg/150 ml premix  Status:  Discontinued     750 mg 150 mL/hr over 60 Minutes Intravenous Every T-Th-Sa (Hemodialysis) 08/21/15 0602 08/22/15 1407   08/21/15 0615  vancomycin (VANCOCIN) 1,500 mg in sodium chloride 0.9 % 500 mL IVPB     1,500 mg 250 mL/hr over 120 Minutes Intravenous  Once 08/21/15 0602 08/21/15 0844   08/19/15 0600  piperacillin-tazobactam (ZOSYN) IVPB 2.25 g  Status:  Discontinued    Comments:  Zosyn 2.25 g IV q8h in ESRD   2.25 g 100 mL/hr over 30 Minutes Intravenous 3 times per day 08/19/15 0539 08/22/15 1407      Assessment/Plan: Patient Active Problem List   Diagnosis  Date Noted  . Acute respiratory failure with hypoxia (HCC)   . Hemodialysis catheter dysfunction (HCC)   . Sepsis (HCC) 08/21/2015  . Fever, unspecified 08/21/2015  . Leukocytosis 08/21/2015  . PAD (peripheral artery disease) (HCC) 08/21/2015  . Right foot pain 08/21/2015  . RLQ abdominal pain 08/30/2015  . Thrombocytopenia (HCC) 09/05/2015  . Anemia, chronic renal failure 09/02/2015  . Hypothyroidism 09/05/2015  . Metabolic encephalopathy 08/17/2015  . Severe mitral regurgitation 10/15/2014  . Coronary artery disease 10/18/2013  . Peripheral arterial disease (HCC) 10/18/2013  . Diabetes mellitus, insulin dependent (IDDM), controlled (HCC) 06/10/2013  . HTN (hypertension) 06/10/2013  . CKD (chronic kidney disease) stage V requiring chronic dialysis (HCC) 06/10/2013  . HLD (hyperlipidemia) 06/10/2013  . Atrial fibrillation with RVR (HCC) 02/01/2013  . Long term current use of anticoagulant therapy 02/01/2013   Abdominal exam benign  Check CBC today   May need further follow  up imaging   LOS: 5 days    Rustyn Conery A. 08/23/2015

## 2015-08-24 ENCOUNTER — Inpatient Hospital Stay (HOSPITAL_COMMUNITY): Payer: Medicare PPO

## 2015-08-24 DIAGNOSIS — T8241XD Breakdown (mechanical) of vascular dialysis catheter, subsequent encounter: Secondary | ICD-10-CM

## 2015-08-24 DIAGNOSIS — K6389 Other specified diseases of intestine: Secondary | ICD-10-CM

## 2015-08-24 DIAGNOSIS — Z7901 Long term (current) use of anticoagulants: Secondary | ICD-10-CM

## 2015-08-24 LAB — RENAL FUNCTION PANEL
ALBUMIN: 1.4 g/dL — AB (ref 3.5–5.0)
ANION GAP: 8 (ref 5–15)
Albumin: 1.4 g/dL — ABNORMAL LOW (ref 3.5–5.0)
Anion gap: 9 (ref 5–15)
BUN: 8 mg/dL (ref 6–20)
BUN: 8 mg/dL (ref 6–20)
CALCIUM: 7.6 mg/dL — AB (ref 8.9–10.3)
CHLORIDE: 98 mmol/L — AB (ref 101–111)
CO2: 26 mmol/L (ref 22–32)
CO2: 25 mmol/L (ref 22–32)
CREATININE: 1.53 mg/dL — AB (ref 0.44–1.00)
CREATININE: 1.49 mg/dL — AB (ref 0.44–1.00)
Calcium: 7.2 mg/dL — ABNORMAL LOW (ref 8.9–10.3)
Chloride: 100 mmol/L — ABNORMAL LOW (ref 101–111)
GFR calc Af Amer: 38 mL/min — ABNORMAL LOW (ref >60)
GFR calc non Af Amer: 32 mL/min — ABNORMAL LOW (ref >60)
GFR calc non Af Amer: 33 mL/min — ABNORMAL LOW (ref >60)
GFR, EST AFRICAN AMERICAN: 37 mL/min — AB (ref >60)
GLUCOSE: 228 mg/dL — AB (ref 65–99)
GLUCOSE: 233 mg/dL — AB (ref 65–99)
PHOSPHORUS: 1.8 mg/dL — AB (ref 2.5–4.6)
POTASSIUM: 4.1 mmol/L (ref 3.5–5.1)
Phosphorus: 3.5 mg/dL (ref 2.5–4.6)
Potassium: 4.3 mmol/L (ref 3.5–5.1)
SODIUM: 134 mmol/L — AB (ref 135–145)
Sodium: 132 mmol/L — ABNORMAL LOW (ref 135–145)

## 2015-08-24 LAB — CBC WITH DIFFERENTIAL/PLATELET
BASOS PCT: 0 %
Basophils Absolute: 0 10*3/uL (ref 0.0–0.1)
EOS ABS: 0 10*3/uL (ref 0.0–0.7)
EOS PCT: 0 %
HCT: 30.2 % — ABNORMAL LOW (ref 36.0–46.0)
HEMOGLOBIN: 9.3 g/dL — AB (ref 12.0–15.0)
LYMPHS ABS: 0.6 10*3/uL — AB (ref 0.7–4.0)
Lymphocytes Relative: 3 %
MCH: 28.8 pg (ref 26.0–34.0)
MCHC: 30.8 g/dL (ref 30.0–36.0)
MCV: 93.5 fL (ref 78.0–100.0)
Monocytes Absolute: 0.8 10*3/uL (ref 0.1–1.0)
Monocytes Relative: 5 %
NEUTROS PCT: 92 %
Neutro Abs: 16.4 10*3/uL — ABNORMAL HIGH (ref 1.7–7.7)
PLATELETS: 111 10*3/uL — AB (ref 150–400)
RBC: 3.23 MIL/uL — AB (ref 3.87–5.11)
RDW: 16.2 % — ABNORMAL HIGH (ref 11.5–15.5)
WBC: 17.8 10*3/uL — AB (ref 4.0–10.5)

## 2015-08-24 LAB — MAGNESIUM: MAGNESIUM: 2.2 mg/dL (ref 1.7–2.4)

## 2015-08-24 LAB — APTT: aPTT: 77 seconds — ABNORMAL HIGH (ref 24–37)

## 2015-08-24 LAB — GLUCOSE, CAPILLARY
Glucose-Capillary: 215 mg/dL — ABNORMAL HIGH (ref 65–99)
Glucose-Capillary: 171 mg/dL — ABNORMAL HIGH (ref 65–99)

## 2015-08-24 LAB — PROTIME-INR
INR: 2.71 — AB (ref 0.00–1.49)
PROTHROMBIN TIME: 28.4 s — AB (ref 11.6–15.2)

## 2015-08-24 MED ORDER — SODIUM PHOSPHATE 3 MMOLE/ML IV SOLN
30.0000 mmol | Freq: Once | INTRAVENOUS | Status: AC
  Administered 2015-08-24: 30 mmol via INTRAVENOUS
  Filled 2015-08-24: qty 10

## 2015-08-24 MED ORDER — INSULIN ASPART 100 UNIT/ML ~~LOC~~ SOLN
0.0000 [IU] | SUBCUTANEOUS | Status: DC
  Administered 2015-08-24 (×2): 5 [IU] via SUBCUTANEOUS
  Administered 2015-08-24: 3 [IU] via SUBCUTANEOUS
  Administered 2015-08-25 (×2): 8 [IU] via SUBCUTANEOUS
  Administered 2015-08-25: 5 [IU] via SUBCUTANEOUS
  Administered 2015-08-25: 3 [IU] via SUBCUTANEOUS
  Administered 2015-08-25: 5 [IU] via SUBCUTANEOUS
  Administered 2015-08-26: 2 [IU] via SUBCUTANEOUS
  Administered 2015-08-26: 5 [IU] via SUBCUTANEOUS
  Administered 2015-08-26 (×2): 3 [IU] via SUBCUTANEOUS
  Administered 2015-08-27: 2 [IU] via SUBCUTANEOUS
  Administered 2015-08-27: 3 [IU] via SUBCUTANEOUS
  Administered 2015-08-27: 8 [IU] via SUBCUTANEOUS

## 2015-08-24 MED ORDER — METOPROLOL TARTRATE 1 MG/ML IV SOLN
2.5000 mg | INTRAVENOUS | Status: DC | PRN
  Administered 2015-08-26 – 2015-09-03 (×3): 5 mg via INTRAVENOUS
  Administered 2015-09-05 (×2): 2.5 mg via INTRAVENOUS
  Administered 2015-09-05: 5 mg via INTRAVENOUS
  Filled 2015-08-24 (×6): qty 5

## 2015-08-24 MED ORDER — IOHEXOL 300 MG/ML  SOLN
25.0000 mL | INTRAMUSCULAR | Status: AC
  Administered 2015-08-24 (×2): 25 mL via ORAL

## 2015-08-24 NOTE — Progress Notes (Signed)
Patient ID: Megan Vasquez, female   DOB: [birth year], 74 y.o.   MRN: [id number]     [facility]      [address] [place], [place] [address]    Phone: [phone] FAX: [phone]     Subjective: No appetite.  No n/v.  No abdominal pain.  Having BMs. Hypothermic, on a heating blanket.  On CVVHD.   Objective:  Vital signs:  Filed Vitals:   08/24/15 0424 08/24/15 0500 08/24/15 0600 08/24/15 0700  BP:  105/55 102/50 102/48  Pulse:      Temp: 94.5 F (34.7 C)     TempSrc: Oral     Resp:  15 16 16   Height:      Weight:  69.1 kg (152 lb 5.4 oz)    SpO2:        Last BM Date: 08/23/15  Intake/Output   Yesterday:  10/09 0701 - 10/10 0700 In: 0814 [P.O.:757; I.V.:240; IV Piggyback:479] Out: 3215  This shift:      Physical Exam: General: Pt awake/alert/oriented x4 in no acute distress  Abdomen: Soft.  Nondistended.  Non tender.  No evidence of peritonitis.  No incarcerated hernias.   Problem List:   Principal Problem:   Atrial fibrillation with RVR (HCC) Active Problems:   Long term current use of anticoagulant therapy   Diabetes mellitus, insulin dependent (IDDM), controlled (HCC)   HTN (hypertension)   CKD (chronic kidney disease) stage V requiring chronic dialysis (HCC)   HLD (hyperlipidemia)   Coronary artery disease   Severe mitral regurgitation   RLQ abdominal pain   Thrombocytopenia (HCC)   Anemia, chronic renal failure   Hypothyroidism   Metabolic encephalopathy   Sepsis (HCC)   Fever, unspecified   Leukocytosis   PAD (peripheral artery disease) (HCC)   Right foot pain   Acute respiratory failure with hypoxia ([place])   Hemodialysis catheter dysfunction ([place])    Results:   Labs: Results for orders placed or performed during the hospital encounter of 08/26/2015 (from the past 48 hour(s))  Glucose, capillary     Status: Abnormal   Collection Time: 08/22/15 11:49 AM  Result Value Ref Range    Glucose-Capillary 129 (H) 65 - 99 mg/dL   Comment 1 Notify RN   Prepare fresh frozen plasma     Status: None   Collection Time: 08/22/15 12:10 PM  Result Value Ref Range   Unit Number [id number]    Blood Component Type THAWED PLASMA    Unit division 00    Status of Unit DISCARDED    Transfusion Status OK TO TRANSFUSE    Unit Number [id number]    Blood Component Type THWPLS APHR1    Unit division 00    Status of Unit ISSUED,FINAL    Transfusion Status OK TO TRANSFUSE    Unit Number [id number]    Blood Component Type THAWED PLASMA    Unit division 00    Status of Unit ISSUED,FINAL    Transfusion Status OK TO TRANSFUSE   Glucose, capillary     Status: Abnormal   Collection Time: 08/22/15  1:54 PM  Result Value Ref Range   Glucose-Capillary 161 (H) 65 - 99 mg/dL  MRSA PCR Screening     Status: None   Collection Time: 08/22/15  3:26 PM  Result Value Ref Range   MRSA by PCR NEGATIVE NEGATIVE    Comment:        The GeneXpert MRSA Assay (FDA approved for  NASAL specimens only), is one component of a comprehensive MRSA colonization surveillance program. It is not intended to diagnose MRSA infection nor to guide or monitor treatment for MRSA infections.   Lactic acid, plasma     Status: None   Collection Time: 08/22/15  9:01 PM  Result Value Ref Range   Lactic Acid, Venous 1.1 0.5 - 2.0 mmol/L  Renal function panel (daily at 1600)     Status: Abnormal   Collection Time: 08/22/15  9:02 PM  Result Value Ref Range   Sodium 131 (L) 135 - 145 mmol/L   Potassium 3.9 3.5 - 5.1 mmol/L   Chloride 94 (L) 101 - 111 mmol/L   CO2 28 22 - 32 mmol/L   Glucose, Bld 237 (H) 65 - 99 mg/dL   BUN 12 6 - 20 mg/dL   Creatinine, Ser 2.82 (H) 0.44 - 1.00 mg/dL    Comment: DELTA CHECK NOTED   Calcium 7.2 (L) 8.9 - 10.3 mg/dL   Phosphorus 2.9 2.5 - 4.6 mg/dL   Albumin 1.5 (L) 3.5 - 5.0 g/dL   GFR calc non Af Amer 15 (L) >60 mL/min   GFR calc Af Amer 18 (L) >60 mL/min    Comment:  (NOTE) The eGFR has been calculated using the CKD EPI equation. This calculation has not been validated in all clinical situations. eGFR's persistently <60 mL/min signify possible Chronic Kidney Disease.    Anion gap 9 5 - 15  Cortisol     Status: None   Collection Time: 08/22/15  9:03 PM  Result Value Ref Range   Cortisol, Plasma 17.8 ug/dL    Comment: (NOTE) AM    6.7 - 22.6 ug/dL PM   <10.0       ug/dL   Glucose, capillary     Status: Abnormal   Collection Time: 08/22/15 10:25 PM  Result Value Ref Range   Glucose-Capillary 199 (H) 65 - 99 mg/dL  Lactate dehydrogenase     Status: Abnormal   Collection Time: 08/22/15 10:33 PM  Result Value Ref Range   LDH 324 (H) 98 - 192 U/L  Protime-INR     Status: Abnormal   Collection Time: 08/23/15  5:06 AM  Result Value Ref Range   Prothrombin Time 29.8 (H) 11.6 - 15.2 seconds   INR 2.90 (H) 0.00 - 1.49  Magnesium     Status: None   Collection Time: 08/23/15  5:06 AM  Result Value Ref Range   Magnesium 1.9 1.7 - 2.4 mg/dL  APTT     Status: Abnormal   Collection Time: 08/23/15  5:06 AM  Result Value Ref Range   aPTT 74 (H) 24 - 37 seconds    Comment:        IF BASELINE aPTT IS ELEVATED, SUGGEST PATIENT RISK ASSESSMENT BE USED TO DETERMINE APPROPRIATE ANTICOAGULANT THERAPY.   Renal function panel     Status: Abnormal   Collection Time: 08/23/15  5:06 AM  Result Value Ref Range   Sodium 135 135 - 145 mmol/L   Potassium 3.8 3.5 - 5.1 mmol/L   Chloride 96 (L) 101 - 111 mmol/L   CO2 28 22 - 32 mmol/L   Glucose, Bld 181 (H) 65 - 99 mg/dL   BUN 10 6 - 20 mg/dL   Creatinine, Ser 2.05 (H) 0.44 - 1.00 mg/dL   Calcium 7.3 (L) 8.9 - 10.3 mg/dL   Phosphorus 2.2 (L) 2.5 - 4.6 mg/dL   Albumin 1.3 (L) 3.5 - 5.0 g/dL  GFR calc non Af Amer 22 (L) >60 mL/min   GFR calc Af Amer 26 (L) >60 mL/min    Comment: (NOTE) The eGFR has been calculated using the CKD EPI equation. This calculation has not been validated in all clinical  situations. eGFR's persistently <60 mL/min signify possible Chronic Kidney Disease.    Anion gap 11 5 - 15  Glucose, capillary     Status: Abnormal   Collection Time: 08/23/15  7:49 AM  Result Value Ref Range   Glucose-Capillary 162 (H) 65 - 99 mg/dL  Glucose, capillary     Status: Abnormal   Collection Time: 08/23/15 11:55 AM  Result Value Ref Range   Glucose-Capillary 191 (H) 65 - 99 mg/dL  CBC     Status: Abnormal   Collection Time: 08/23/15 12:17 PM  Result Value Ref Range   WBC 15.9 (H) 4.0 - 10.5 K/uL   RBC 3.08 (L) 3.87 - 5.11 MIL/uL   Hemoglobin 9.2 (L) 12.0 - 15.0 g/dL   HCT 29.1 (L) 36.0 - 46.0 %   MCV 94.5 78.0 - 100.0 fL   MCH 29.9 26.0 - 34.0 pg   MCHC 31.6 30.0 - 36.0 g/dL   RDW 16.3 (H) 11.5 - 15.5 %   Platelets 95 (L) 150 - 400 K/uL    Comment: REPEATED TO VERIFY CONSISTENT WITH PREVIOUS RESULT   Renal function panel (daily at 1600)     Status: Abnormal   Collection Time: 08/23/15  4:00 PM  Result Value Ref Range   Sodium 131 (L) 135 - 145 mmol/L   Potassium 4.3 3.5 - 5.1 mmol/L   Chloride 96 (L) 101 - 111 mmol/L   CO2 25 22 - 32 mmol/L   Glucose, Bld 232 (H) 65 - 99 mg/dL   BUN 7 6 - 20 mg/dL   Creatinine, Ser 1.73 (H) 0.44 - 1.00 mg/dL   Calcium 7.2 (L) 8.9 - 10.3 mg/dL   Phosphorus 1.8 (L) 2.5 - 4.6 mg/dL   Albumin 1.3 (L) 3.5 - 5.0 g/dL   GFR calc non Af Amer 27 (L) >60 mL/min   GFR calc Af Amer 32 (L) >60 mL/min    Comment: (NOTE) The eGFR has been calculated using the CKD EPI equation. This calculation has not been validated in all clinical situations. eGFR's persistently <60 mL/min signify possible Chronic Kidney Disease.    Anion gap 10 5 - 15  Glucose, capillary     Status: Abnormal   Collection Time: 08/23/15  4:10 PM  Result Value Ref Range   Glucose-Capillary 224 (H) 65 - 99 mg/dL  Glucose, capillary     Status: Abnormal   Collection Time: 08/23/15  9:47 PM  Result Value Ref Range   Glucose-Capillary 310 (H) 65 - 99 mg/dL   Protime-INR     Status: Abnormal   Collection Time: 08/24/15  5:10 AM  Result Value Ref Range   Prothrombin Time 28.4 (H) 11.6 - 15.2 seconds   INR 2.71 (H) 0.00 - 1.49  Magnesium     Status: None   Collection Time: 08/24/15  5:10 AM  Result Value Ref Range   Magnesium 2.2 1.7 - 2.4 mg/dL  APTT     Status: Abnormal   Collection Time: 08/24/15  5:10 AM  Result Value Ref Range   aPTT 77 (H) 24 - 37 seconds    Comment:        IF BASELINE aPTT IS ELEVATED, SUGGEST PATIENT RISK ASSESSMENT BE USED TO DETERMINE APPROPRIATE ANTICOAGULANT  THERAPY.   CBC with Differential/Platelet     Status: Abnormal   Collection Time: 08/24/15  5:10 AM  Result Value Ref Range   WBC 17.8 (H) 4.0 - 10.5 K/uL    Comment: REPEATED TO VERIFY   RBC 3.23 (L) 3.87 - 5.11 MIL/uL   Hemoglobin 9.3 (L) 12.0 - 15.0 g/dL    Comment: REPEATED TO VERIFY CONSISTENT WITH PREVIOUS RESULT    HCT 30.2 (L) 36.0 - 46.0 %   MCV 93.5 78.0 - 100.0 fL   MCH 28.8 26.0 - 34.0 pg   MCHC 30.8 30.0 - 36.0 g/dL   RDW 16.2 (H) 11.5 - 15.5 %   Platelets 111 (L) 150 - 400 K/uL    Comment: SPECIMEN CHECKED FOR CLOTS REPEATED TO VERIFY CONSISTENT WITH PREVIOUS RESULT    Neutrophils Relative % 92 %   Neutro Abs 16.4 (H) 1.7 - 7.7 K/uL   Lymphocytes Relative 3 %   Lymphs Abs 0.6 (L) 0.7 - 4.0 K/uL   Monocytes Relative 5 %   Monocytes Absolute 0.8 0.1 - 1.0 K/uL   Eosinophils Relative 0 %   Eosinophils Absolute 0.0 0.0 - 0.7 K/uL   Basophils Relative 0 %   Basophils Absolute 0.0 0.0 - 0.1 K/uL  Renal function panel     Status: Abnormal   Collection Time: 08/24/15  5:10 AM  Result Value Ref Range   Sodium 134 (L) 135 - 145 mmol/L   Potassium 4.3 3.5 - 5.1 mmol/L   Chloride 100 (L) 101 - 111 mmol/L   CO2 26 22 - 32 mmol/L   Glucose, Bld 228 (H) 65 - 99 mg/dL   BUN 8 6 - 20 mg/dL   Creatinine, Ser 1.53 (H) 0.44 - 1.00 mg/dL   Calcium 7.6 (L) 8.9 - 10.3 mg/dL   Phosphorus 1.8 (L) 2.5 - 4.6 mg/dL   Albumin 1.4 (L) 3.5 - 5.0  g/dL   GFR calc non Af Amer 32 (L) >60 mL/min   GFR calc Af Amer 37 (L) >60 mL/min    Comment: (NOTE) The eGFR has been calculated using the CKD EPI equation. This calculation has not been validated in all clinical situations. eGFR's persistently <60 mL/min signify possible Chronic Kidney Disease.    Anion gap 8 5 - 15    Imaging / Studies: Dg Chest Port 1 View  08/24/2015   CLINICAL DATA:  Follow-up pleural effusion, coronary artery disease, mitral regurgitation, chronic renal insufficiency, dialysis dependent.  EXAM: PORTABLE CHEST 1 VIEW  COMPARISON:  Portable chest x-ray of August 23, 2015.  FINDINGS: The lungs are reasonably well inflated. There is persistent moderate size right pleural effusion and small left pleural effusion. There is bibasilar atelectasis greater on the right. The cardiac silhouette is mildly enlarged. The pulmonary vascularity is less engorged today. The right internal jugular venous catheter tip projects over the proximal third of the SVC. There are 7 intact sternal wires.  IMPRESSION: Slight interval improvement in pulmonary interstitial edema and pulmonary vascular congestion. Persistent bilateral pleural effusions and basilar atelectasis greater on the right.   Electronically Signed   By: David  Martinique M.D.   On: 08/24/2015 07:12   Dg Chest Port 1 View  08/23/2015   CLINICAL DATA:  Follow-up of pulmonary edema.  EXAM: PORTABLE CHEST 1 VIEW  COMPARISON:  08/22/2015  FINDINGS: Right internal jugular approach central venous catheter is unchanged, with tip projecting over the expected location of mid sub superior vena cava. Postsurgical changes are stable.  Cardiomediastinal silhouette is enlarged. Mediastinal contours appear intact.  There is no evidence of pneumothorax. There is persistent prominence of interstitial markings with bilateral pleural effusions, right greater than left, and bibasilar airspace consolidation.  Osseous structures are without acute abnormality.  Soft tissues are grossly normal.  IMPRESSION: Persistent appearance of pulmonary edema with bilateral pleural effusions.  Stably enlarged cardiac silhouette.   Electronically Signed   By: Fidela Salisbury M.D.   On: 08/23/2015 09:43   Dg Chest Port 1 View  08/22/2015   CLINICAL DATA:  Bedside central venous catheter placement. Followup pulmonary edema and possible superimposed pneumonia.  EXAM: PORTABLE CHEST 1 VIEW  COMPARISON:  08/21/2015 and earlier.  FINDINGS: Right jugular central venous catheter tip projects over the mid SVC. No evidence of pneumothorax or mediastinal hematoma.  Prior sternotomy CABG. Cardiac silhouette markedly enlarged, unchanged. Mild diffuse interstitial pulmonary edema, unchanged. Consolidation in the lower lobes, unchanged. Bilateral pleural effusions, right greater than left, unchanged. No new pulmonary parenchymal abnormality.  IMPRESSION: 1. Right jugular central venous catheter tip projects over the mid SVC. No acute complicating features. 2. Stable mild CHF and/or fluid overload, bilateral pleural effusions (right greater than left) and bilateral lower lobe atelectasis and/or pneumonia. 3. No new abnormalities.   Electronically Signed   By: Evangeline Dakin M.D.   On: 08/22/2015 18:08    Medications / Allergies:  Scheduled Meds: . anidulafungin  100 mg Intravenous Q24H  . darbepoetin (ARANESP) injection - DIALYSIS  150 mcg Intravenous Q Tue-HD  . doxercalciferol  1 mcg Intravenous Q T,Th,Sa-HD  . feeding supplement  1 Container Oral TID BM  . hydrocortisone sodium succinate  50 mg Intravenous Q6H  . insulin aspart  0-5 Units Subcutaneous QHS  . insulin aspart  0-9 Units Subcutaneous TID WC  . levothyroxine  250 mcg Oral QAC breakfast  . midodrine  10 mg Oral TID WC  . multivitamin  1 tablet Oral QHS  . piperacillin-tazobactam (ZOSYN)  IV  2.25 g Intravenous Q6H  . pneumococcal 23 valent vaccine  0.5 mL Intramuscular Tomorrow-1000  . pravastatin  40 mg Oral  q1800  . sodium chloride  3 mL Intravenous Q12H  . vancomycin  750 mg Intravenous Q24H   Continuous Infusions: . dialysis replacement fluid (prismasate) 400 mL/hr at 08/23/15 2254  . dialysis replacement fluid (prismasate) 200 mL/hr at 08/23/15 2254  . dialysate (PRISMASATE) 1,800 mL/hr at 08/24/15 0727   PRN Meds:.sodium chloride, alteplase, heparin, ondansetron **OR** ondansetron (ZOFRAN) IV, sodium chloride, sodium chloride  Antibiotics: Anti-infectives    Start     Dose/Rate Route Frequency Ordered Stop   08/23/15 1800  vancomycin (VANCOCIN) IVPB 750 mg/150 ml premix  Status:  Discontinued     750 mg 150 mL/hr over 60 Minutes Intravenous Every 24 hours 08/22/15 1419 08/22/15 1422   08/23/15 1300  anidulafungin (ERAXIS) 100 mg in sodium chloride 0.9 % 100 mL IVPB     100 mg over 90 Minutes Intravenous Every 24 hours 08/22/15 1236     08/23/15 0800  vancomycin (VANCOCIN) IVPB 750 mg/150 ml premix     750 mg 150 mL/hr over 60 Minutes Intravenous Every 24 hours 08/22/15 1422     08/22/15 1600  piperacillin-tazobactam (ZOSYN) IVPB 2.25 g     2.25 g 100 mL/hr over 30 Minutes Intravenous Every 6 hours 08/22/15 1419     08/22/15 1300  anidulafungin (ERAXIS) 200 mg in sodium chloride 0.9 % 200 mL IVPB     200 mg over  180 Minutes Intravenous  Once 08/22/15 1234 08/22/15 2029   08/22/15 1215  anidulafungin (ERAXIS) 200 mg in sodium chloride 0.9 % 200 mL IVPB  Status:  Discontinued     200 mg over 180 Minutes Intravenous Every 24 hours 08/22/15 1208 08/22/15 1234   08/22/15 1200  vancomycin (VANCOCIN) IVPB 750 mg/150 ml premix  Status:  Discontinued     750 mg 150 mL/hr over 60 Minutes Intravenous Every T-Th-Sa (Hemodialysis) 08/21/15 0602 08/22/15 1407   08/21/15 0615  vancomycin (VANCOCIN) 1,500 mg in sodium chloride 0.9 % 500 mL IVPB     1,500 mg 250 mL/hr over 120 Minutes Intravenous  Once 08/21/15 0602 08/21/15 0844   08/19/15 0600  piperacillin-tazobactam (ZOSYN) IVPB 2.25 g   Status:  Discontinued    Comments:  Zosyn 2.25 g IV q8h in ESRD   2.25 g 100 mL/hr over 30 Minutes Intravenous 3 times per day 08/19/15 0539 08/22/15 1407        Assessment/Plan Abdominal pain/pneumatosis-benign abdominal, however, the patient is on high dose steroids for AI which can mask symptoms and can increase WBC.  Last CT 10/5.  Will consider repeat CT today.  Further recs to follow after d/w Dr. Donne Hazel.  ID-vanc D#4 zosyn D#6 FEN-clears  Erby Pian, ANP-BC [facility] Pager [phone](7A-4:30P) For consults and floor pages call [phone](7A-4:30P)  08/24/2015 7:44 AM

## 2015-08-24 NOTE — Progress Notes (Signed)
   08/24/15 1500  Clinical Encounter Type  Visited With Patient  Visit Type Initial  Spiritual Encounters  Spiritual Needs Emotional   Chaplain visited with Patient and provided emotional support.

## 2015-08-24 NOTE — Procedures (Signed)
Admit: 09-Sep-2015 LOS: 6  75F ESRD [facility] via AVF w/ pulmonary edema/hypervolemia, colonic pneumatosis, permAFib, known ASCVD.  Current CRRT Prescription: Start Date: 08/22/15 Catheter: R IJ  [facility] 08/22/15 nontunneled BFR: 200 4K Pre Blood Pump: 400 4K DFR: 1800 4K Replacement Rate: 200 Goal UF: 93mL/hr net negative Anticoagulation: systemic anticoagulation Clotting: none since beginning   S: Tolerating CRRT w/o pressors Awake, alert this AM Denies abdominal pain 1.7L net negative Weight down some  O: 10/09 0701 - 10/10 0700 In: 1476 [P.O.:757; I.V.:240; IV Piggyback:479] Out: 3215   Filed Weights   08/22/15 1054 08/23/15 0400 08/24/15 0500  Weight: 69 kg (152 lb 1.9 oz) 69.6 kg (153 lb 7 oz) 69.1 kg (152 lb 5.4 oz)     Recent Labs Lab 08/23/15 0506 08/23/15 1600 08/24/15 0510  NA 135 131* 134*  K 3.8 4.3 4.3  CL 96* 96* 100*  CO2 GLUCOSE 181* 232* 228*  BUN CREATININE 2.05* 1.73* 1.53*  CALCIUM 7.3* 7.2* 7.6*  PHOS 2.2* 1.8* 1.8*    Recent Labs Lab September 09, 2015 1120  08/22/15 0148 08/23/15 1217 08/24/15 0510  WBC 9.7  < > 18.2* 15.9* 17.8*  NEUTROABS 8.9*  --   --   --  16.4*  HGB 8.8*  < > 9.1* 9.2* 9.3*  HCT 29.4*  < > 29.0* 29.1* 30.2*  MCV 98.3  < > 93.5 94.5 93.5  PLT 80*  < > 97* 95* 111*  < > = values in this interval not displayed.  Scheduled Meds: . anidulafungin  100 mg Intravenous Q24H  . darbepoetin (ARANESP) injection - DIALYSIS  150 mcg Intravenous Q Tue-HD  . doxercalciferol  1 mcg Intravenous Q T,Th,Sa-HD  . feeding supplement  1 Container Oral TID BM  . hydrocortisone sodium succinate  50 mg Intravenous Q6H  . insulin aspart  0-5 Units Subcutaneous QHS  . insulin aspart  0-9 Units Subcutaneous TID WC  . levothyroxine  250 mcg Oral QAC breakfast  . midodrine  10 mg Oral TID WC  . multivitamin  1 tablet Oral QHS  . piperacillin-tazobactam (ZOSYN)  IV  2.25 g Intravenous Q6H  . pneumococcal 23 valent vaccine  0.5  mL Intramuscular Tomorrow-1000  . pravastatin  40 mg Oral q1800  . sodium chloride  3 mL Intravenous Q12H  . vancomycin  750 mg Intravenous Q24H   Continuous Infusions: . dialysis replacement fluid (prismasate) 400 mL/hr at 08/23/15 2254  . dialysis replacement fluid (prismasate) 200 mL/hr at 08/23/15 2254  . dialysate (PRISMASATE) 1,800 mL/hr at 08/24/15 0727   PRN Meds:.sodium chloride, alteplase, heparin, ondansetron **OR** ondansetron (ZOFRAN) IV, sodium chloride, sodium chloride  ABG    Component Value Date/Time   PHART 7.509* 08/21/2015 2242   PCO2ART 36.8 08/21/2015 2242   PO2ART 76.7* 08/21/2015 2242   HCO3 29.1* 08/21/2015 2242   TCO2 30.2 08/21/2015 2242   O2SAT 92.9 08/21/2015 2242    Outpt Dialysis Orders: TTS [facility] 4 hrs 65.5kgs 2K/2Ca L AVF 1000u hep hectorol 2 venofor 100 q HD until 10/8 then 50 q week Micera 100 q2 weeks - to start 10/6  A/P  1. 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 Outpt LUE AVF, using TDC R IJ now 2. No anticoagulation in RRT Circuit 3. Tolerating UF, cont at current settings 4. K ok, low Phos at 1.8, replete today 2.  Hypotension during HD 10/8 1. Low/stable off pressors 2. Cont with mild RVR  3. On stress dose steroids  for relative AI 4. On midodrine 3. ?colonic ischemia -- CCS following, on ABX, benign abd this AM; on vanc/zosyn/anidulafungin 4. Permanent AFib: Cardiology following 5. ASCVD/hx/o CABG 6. Anemia, stable, monitor 1. ESA ASranesp qTues  Sabra Heck, MD [employer] pgr [phone]

## 2015-08-24 NOTE — Progress Notes (Signed)
Inpatient Diabetes Program Recommendations  AACE/ADA: New Consensus Statement on Inpatient Glycemic Control (2015)  Target Ranges:  Prepandial:   less than 140 mg/dL      Peak postprandial:   less than 180 mg/dL (1-2 hours)      Critically ill patients:  140 - 180 mg/dL   Review of Glycemic Control:  Results for GEM, CONKLE (MRN [id number]) as of 08/24/2015 12:18  Ref. Range 08/23/2015 07:49 08/23/2015 11:55 08/23/2015 16:10 08/23/2015 21:47 08/24/2015 07:45  Glucose-Capillary Latest Ref Range: 65-99 mg/dL 409 (H) 811 (H) 914 (H) 310 (H) 215 (H)    Diabetes history: Insulin Dependent Diabetes per problem list Outpatient Diabetes medications: Lantus 10 units bid Current orders for Inpatient glycemic control:  Novolog moderate q 4 hours  Inpatient Diabetes Program Recommendations:  Please consider restarting at least 1/2 of home dose of basal insulin.  Consider Lantus 5 units bid.  Thanks, Beryl Meager, RN, BC-ADM Inpatient Diabetes Coordinator Pager [phone] (8a-5p)

## 2015-08-24 NOTE — Progress Notes (Signed)
PULMONARY / CRITICAL CARE MEDICINE   Name: Megan Vasquez MRN: [id number] DOB: 27-Jan-1941    ADMISSION DATE:  09/05/2015 CONSULTATION DATE:  10/8  REFERRING MD :  Elvera Lennox   CHIEF COMPLAINT:  Hypotension   INITIAL PRESENTATION:  74 year old female w/ ESRD, DM, and CAF admitted 10/4 w/ infectious vs ischemic colitis. Treated conservatively w/ IVFs and abx. MS and BP worse on 10/8 w/ hypotension noted during HD so PCCM asked to assess.   STUDIES:  10/4 CT abd: 1. Apparent pneumatosis along the cecum and ascending colon at the right lower quadrant, of uncertain significance, with mild associated wall thickening and soft tissue inflammation.  ECHO 10/5: moderate LVH. Systolic function wasnormal. The estimated ejection fraction was in the range of 60%to 65%.  - Left atrium: Severely dilated. - Right ventricle: The cavity size was mildly dilated. - Right atrium: Severely dilated. - Tricuspid valve: There was moderate regurgitation. - Pulmonary arteries: PA peak pressure: 48 mm Hg (S).  SIGNIFICANT EVENTS: 10/8 -  cvvhd started  SUBJECTIVE:  Bp remains soft debies abd pain or dizziness On CVVH  VITAL SIGNS: Temp:  [94.5 F (34.7 C)-98.6 F (37 C)] 98.6 F (37 C) (10/10 1200) Pulse Rate:  [92-100] 92 (10/10 0000) Resp:  [14-41] 25 (10/10 1130) BP: (84-133)/(45-84) 96/50 mmHg (10/10 1130) SpO2:  [96 %-99 %] 98 % (10/10 0800) Weight:  [69.1 kg (152 lb 5.4 oz)] 69.1 kg (152 lb 5.4 oz) (10/10 0500) HEMODYNAMICS:   VENTILATOR SETTINGS:   INTAKE / OUTPUT:  Intake/Output Summary (Last 24 hours) at 08/24/15 1326 Last data filed at 08/24/15 1101  Gross per 24 hour  Intake   1805 ml  Output   3350 ml  Net  -1545 ml    PHYSICAL EXAMINATION: General:  Chr ill, no distress, little drowsy Neuro:  Awakens, O x 3 HEENT:  NCAT, poor dentition. No JVD, catheter in rt ij WNL  Cardiovascular:  s1 s2 ir slight tachy Lungs:  reduced Abdomen:  Soft, non-tender + bowel sounds   Musculoskeletal:  Intact  Skin:  Dry and intact   LABS:  CBC  Recent Labs Lab 08/22/15 0148 08/23/15 1217 08/24/15 0510  WBC 18.2* 15.9* 17.8*  HGB 9.1* 9.2* 9.3*  HCT 29.0* 29.1* 30.2*  PLT 97* 95* 111*   Coag's  Recent Labs Lab 08/22/15 0148 08/23/15 0506 08/24/15 0510  APTT  --  74* 77*  INR 4.08* 2.90* 2.71*   BMET  Recent Labs Lab 08/23/15 0506 08/23/15 1600 08/24/15 0510  NA 135 131* 134*  K 3.8 4.3 4.3  CL 96* 96* 100*  CO2 BUN CREATININE 2.05* 1.73* 1.53*  GLUCOSE 181* 232* 228*   Electrolytes  Recent Labs Lab 08/23/15 0506 08/23/15 1600 08/24/15 0510  CALCIUM 7.3* 7.2* 7.6*  MG 1.9  --  2.2  PHOS 2.2* 1.8* 1.8*   Sepsis Markers  Recent Labs Lab 09/04/2015 2020  08/20/15 0510 08/21/15 2328 08/22/15 0148 08/22/15 2101  LATICACIDVEN 2.3*  < > 1.8 1.3 1.4 1.1  PROCALCITON 52.76  --  35.22  --  22.42  --   < > = values in this interval not displayed. ABG  Recent Labs Lab 08/21/15 2242  PHART 7.509*  PCO2ART 36.8  PO2ART 76.7*   Liver Enzymes  Recent Labs Lab 08/19/15 0025 08/20/15 0510 08/22/15 0148  08/23/15 0506 08/23/15 1600 08/24/15 0510  AST 53* 64* 33  --   --   --   --  ALT --   --   --   --   ALKPHOS 81 73 79  --   --   --   --   BILITOT 0.6 1.0 0.9  --   --   --   --   ALBUMIN 1.6* 1.6* 1.4*  < > 1.3* 1.3* 1.4*  < > = values in this interval not displayed. Cardiac Enzymes No results for input(s): TROPONINI, PROBNP in the last 168 hours. Glucose  Recent Labs Lab 08/22/15 2225 08/23/15 0749 08/23/15 1155 08/23/15 1610 08/23/15 2147 08/24/15 0745  GLUCAP 199* 162* 191* 224* 310* 215*    Imaging Dg Chest Port 1 View  08/24/2015   CLINICAL DATA:  Follow-up pleural effusion, coronary artery disease, mitral regurgitation, chronic renal insufficiency, dialysis dependent.  EXAM: PORTABLE CHEST 1 VIEW  COMPARISON:  Portable chest x-ray of August 23, 2015.  FINDINGS: The lungs  are reasonably well inflated. There is persistent moderate size right pleural effusion and small left pleural effusion. There is bibasilar atelectasis greater on the right. The cardiac silhouette is mildly enlarged. The pulmonary vascularity is less engorged today. The right internal jugular venous catheter tip projects over the proximal third of the SVC. There are 7 intact sternal wires.  IMPRESSION: Slight interval improvement in pulmonary interstitial edema and pulmonary vascular congestion. Persistent bilateral pleural effusions and basilar atelectasis greater on the right.   Electronically Signed   By: David  [place] M.D.   On: 08/24/2015 07:12    PCXR w/ progressive R>L edema   ASSESSMENT / PLAN:  PULMONARY A: Atelectasis  Acute pulm edema BL pleural effusions P:   Pulse ox to sats goal 92%   CARDIOVASCULAR CVL RIJ  A:  AF w/ RVR CHADVASc = 6  Severe sepsis; intermittent shock a/w HD and volume removal -Nml LA re assuring H/o PAD Progressive pulmonary edema and effusions Mod to severe MR Mild PAH Cortisol is less 20, has rel AI P:  Add stress roids If needed add neo for map goal 55-60   RENAL A:   ESRD Baseline dry weight is 65.5kgs. P:   For CRRT Tolerates neg balance  GASTROINTESTINAL A:   RLQ abd pain/colitis ? Ischemic vs infective. Abd exam benign  Protein calorie malnutrition  P:   Clears, diet advance per surgery? For repeat CT abd per surgery  HEMATOLOGIC A:   No acute bleeding Mild thrombocytopenia   Coumadin induced coagulopathy (fib, cva) s/p 2U FFP P:  Trend cbc PAS INR daily Consider heparin drip once INR less 2.0 (may need further procedures)   INFECTIOUS A:   Infectious vs ischemic Colitis  P:   vanc 10/6>>>10/10 Zosyn 10/4>>> eraxis 10/8>>> BCX2 10/7>>> ng  Consider CT per surgery Pct  trend good  ENDOCRINE A:   DM Hypothyroidism  P:   ssi Cont synthroid  NEUROLOGIC A:  Mild acute encephalopathy  P:    Avoid  benzo  FAMILY  - Updates: to pt  - Inter-disciplinary family meet or Palliative Care meeting due by:  10/15  The patient is critically ill with multiple organ systems failure and requires high complexity decision making for assessment and support, frequent evaluation and titration of therapies, application of advanced monitoring technologies and extensive interpretation of multiple databases. Critical Care Time devoted to patient care services described in this note independent of APP time is 31 minutes.   Oretha Milch MD 230 [phone]

## 2015-08-24 NOTE — Progress Notes (Signed)
Subjective: No pain or SOB  Objective: Vital signs in last 24 hours: Temp:  [94.5 F (34.7 C)-98.8 F (37.1 C)] 97.8 F (36.6 C) (10/10 0747) Pulse Rate:  [92-100] 92 (10/10 0000) Resp:  [14-41] 19 (10/10 0800) BP: (84-133)/(38-69) 104/47 mmHg (10/10 0800) SpO2:  [96 %-100 %] 98 % (10/10 0800) Weight:  [152 lb 5.4 oz (69.1 kg)] 152 lb 5.4 oz (69.1 kg) (10/10 0500) Last BM Date: 08/23/15  Intake/Output from previous day: 10/09 0701 - 10/10 0700 In: 1476 [P.O.:757; I.V.:240; IV Piggyback:479] Out: 3215  Intake/Output this shift: Total I/O In: 160 [I.V.:10; IV Piggyback:150] Out: 121 [Other:121]  Medications Scheduled Meds: . anidulafungin  100 mg Intravenous Q24H  . darbepoetin (ARANESP) injection - DIALYSIS  150 mcg Intravenous Q Tue-HD  . doxercalciferol  1 mcg Intravenous Q T,Th,Sa-HD  . feeding supplement  1 Container Oral TID BM  . hydrocortisone sodium succinate  50 mg Intravenous Q6H  . insulin aspart  0-5 Units Subcutaneous QHS  . insulin aspart  0-9 Units Subcutaneous TID WC  . levothyroxine  250 mcg Oral QAC breakfast  . midodrine  10 mg Oral TID WC  . multivitamin  1 tablet Oral QHS  . piperacillin-tazobactam (ZOSYN)  IV  2.25 g Intravenous Q6H  . pneumococcal 23 valent vaccine  0.5 mL Intramuscular Tomorrow-1000  . pravastatin  40 mg Oral q1800  . sodium chloride  3 mL Intravenous Q12H  . sodium phosphate  Dextrose 5% IVPB  30 mmol Intravenous Once  . vancomycin  750 mg Intravenous Q24H   Continuous Infusions: . dialysis replacement fluid (prismasate) 400 mL/hr at 08/23/15 2254  . dialysis replacement fluid (prismasate) 200 mL/hr at 08/23/15 2254  . dialysate (PRISMASATE) 1,800 mL/hr at 08/24/15 0727   PRN Meds:.sodium chloride, alteplase, heparin, ondansetron **OR** ondansetron (ZOFRAN) IV, sodium chloride, sodium chloride  PE: General appearance: alert, cooperative and no distress Lungs: clear to auscultation bilaterally Heart: irregularly  irregular rhythm and Rate elevated.  2/6 sys MM Extremities: No LEE Pulses: 1+ radials Skin: Warm and dry Neurologic: Grossly normal  Lab Results:   Recent Labs  08/22/15 0148 08/23/15 1217 08/24/15 0510  WBC 18.2* 15.9* 17.8*  HGB 9.1* 9.2* 9.3*  HCT 29.0* 29.1* 30.2*  PLT 97* 95* 111*   BMET  Recent Labs  08/23/15 0506 08/23/15 1600 08/24/15 0510  NA 135 131* 134*  K 3.8 4.3 4.3  CL 96* 96* 100*  CO2 GLUCOSE 181* 232* 228*  BUN CREATININE 2.05* 1.73* 1.53*  CALCIUM 7.3* 7.2* 7.6*   PT/INR  Recent Labs  08/22/15 0148 08/23/15 0506 08/24/15 0510  LABPROT 38.6* 29.8* 28.4*  INR 4.08* 2.90* 2.71*    Assessment/Plan Chronically ill-appearing 74 year old African-American female with PMH of HTN, HLD, DM, permanent afib on coumadin, hypothyroidism, ESRD on HD TTS, CVA, severe MR and h/o CAD s/p CABG (LIMA to LAD, SVG to diag, SVG to OM and SVG to distal RCA) by Dr. Laneta Simmers 01/2006 presented with AMS  Principal Problem:   Atrial fibrillation with RVR (HCC) Active Problems:   Long term current use of anticoagulant therapy   Diabetes mellitus, insulin dependent (IDDM), controlled (HCC)   HTN (hypertension)   CKD (chronic kidney disease) stage V requiring chronic dialysis (HCC)   HLD (hyperlipidemia)   Coronary artery disease   Severe mitral regurgitation   RLQ abdominal pain   Thrombocytopenia (HCC)   Anemia, chronic renal failure   Hypothyroidism  Metabolic encephalopathy   Sepsis (HCC)   Fever, unspecified   Leukocytosis   PAD (peripheral artery disease) (HCC)   Right foot pain   Acute respiratory failure with hypoxia (HCC)   Hemodialysis catheter dysfunction (HCC)  1. Permanent afib - Overall rate trend is for the last 24- 36 hours stable.  rate control has been complicated by hypotension, especially during hemodialysis. Appears lopressor was stopped due to low bp's.. Heart rates 90s to low 100s, given her medical illness rates are  appropriate. If elevated rates can try amio gtt.  Not needed now.  - INR is therapeutic   2. Moderate to Severe MR - Net fluids: -1.7L/+0.5L.  Fluid mgt through HD - echo 08/2015 mod to severe MR, LVEF is normal at 60-65%. PASP 48 - CXR 08/24/15 Slight interval improvement in pulmonary interstitial edema and pulmonary vascular congestion. Persistent bilateral pleural effusions and basilar atelectasis greater on the right. - fluid removal with HD as tolerated, has been limited due to hypotension. On midodrine,   3. Hypotension - per primary team. Possible septic component, she has also been diagsed with relative adrenal insufficiency with plans for steroids  4. Colitis - abx per primary team    LOS: 6 days    HAGER, BRYAN PA-C 08/24/2015 8:35 AM   Agree with note written by Jones Skene PAC  Events noted and data base reviewed. Pt well known to me with H/O CAD s/p CABG with preserved LV Fxn and severe MR. She has CAF on coumadin A/C and is therapeutic. We were asked to see for Afib and HR. She has pneumatosis prob secondary to ischemic bowel being managed medically secondary to comorbidities. BB help for hypotension. Getting HD for volume management. At this point HR about 100 and SBP 90-100. No coronary ischemic Sx. Will continue to follow. Remainder of care per PCCM and Renal.  Nanetta Batty 08/24/2015 9:20 AM

## 2015-08-25 DIAGNOSIS — K559 Vascular disorder of intestine, unspecified: Principal | ICD-10-CM | POA: Insufficient documentation

## 2015-08-25 DIAGNOSIS — I9589 Other hypotension: Secondary | ICD-10-CM

## 2015-08-25 DIAGNOSIS — G9341 Metabolic encephalopathy: Secondary | ICD-10-CM

## 2015-08-25 DIAGNOSIS — I959 Hypotension, unspecified: Secondary | ICD-10-CM | POA: Insufficient documentation

## 2015-08-25 LAB — CBC
HCT: 26.6 % — ABNORMAL LOW (ref 36.0–46.0)
Hemoglobin: 8.5 g/dL — ABNORMAL LOW (ref 12.0–15.0)
MCH: 29.8 pg (ref 26.0–34.0)
MCHC: 32 g/dL (ref 30.0–36.0)
MCV: 93.3 fL (ref 78.0–100.0)
PLATELETS: 115 10*3/uL — AB (ref 150–400)
RBC: 2.85 MIL/uL — ABNORMAL LOW (ref 3.87–5.11)
RDW: 15.9 % — AB (ref 11.5–15.5)
WBC: 16.4 10*3/uL — AB (ref 4.0–10.5)

## 2015-08-25 LAB — RENAL FUNCTION PANEL
ALBUMIN: 1.4 g/dL — AB (ref 3.5–5.0)
ANION GAP: 8 (ref 5–15)
Albumin: 1.4 g/dL — ABNORMAL LOW (ref 3.5–5.0)
Anion gap: 7 (ref 5–15)
BUN: 8 mg/dL (ref 6–20)
BUN: 10 mg/dL (ref 6–20)
CALCIUM: 7.2 mg/dL — AB (ref 8.9–10.3)
CO2: 27 mmol/L (ref 22–32)
CO2: 26 mmol/L (ref 22–32)
CREATININE: 1.38 mg/dL — AB (ref 0.44–1.00)
Calcium: 7.2 mg/dL — ABNORMAL LOW (ref 8.9–10.3)
Chloride: 98 mmol/L — ABNORMAL LOW (ref 101–111)
Chloride: 98 mmol/L — ABNORMAL LOW (ref 101–111)
Creatinine, Ser: 1.33 mg/dL — ABNORMAL HIGH (ref 0.44–1.00)
GFR calc Af Amer: 42 mL/min — ABNORMAL LOW (ref >60)
GFR calc Af Amer: 44 mL/min — ABNORMAL LOW (ref >60)
GFR calc non Af Amer: 38 mL/min — ABNORMAL LOW (ref >60)
GFR, EST NON AFRICAN AMERICAN: 36 mL/min — AB (ref >60)
GLUCOSE: 311 mg/dL — AB (ref 65–99)
Glucose, Bld: 158 mg/dL — ABNORMAL HIGH (ref 65–99)
PHOSPHORUS: 1.9 mg/dL — AB (ref 2.5–4.6)
Phosphorus: 3.6 mg/dL (ref 2.5–4.6)
Potassium: 3.9 mmol/L (ref 3.5–5.1)
Potassium: 4.1 mmol/L (ref 3.5–5.1)
SODIUM: 132 mmol/L — AB (ref 135–145)
Sodium: 132 mmol/L — ABNORMAL LOW (ref 135–145)

## 2015-08-25 LAB — APTT: APTT: 68 s — AB (ref 24–37)

## 2015-08-25 LAB — PROTIME-INR
INR: 2.82 — AB (ref 0.00–1.49)
PROTHROMBIN TIME: 29.2 s — AB (ref 11.6–15.2)

## 2015-08-25 LAB — GLUCOSE, CAPILLARY
GLUCOSE-CAPILLARY: 237 mg/dL — AB (ref 65–99)
GLUCOSE-CAPILLARY: 239 mg/dL — AB (ref 65–99)
Glucose-Capillary: 162 mg/dL — ABNORMAL HIGH (ref 65–99)
Glucose-Capillary: 253 mg/dL — ABNORMAL HIGH (ref 65–99)

## 2015-08-25 LAB — MAGNESIUM: Magnesium: 2.2 mg/dL (ref 1.7–2.4)

## 2015-08-25 MED ORDER — SODIUM PHOSPHATE 3 MMOLE/ML IV SOLN
30.0000 mmol | Freq: Once | INTRAVENOUS | Status: AC
  Administered 2015-08-25: 30 mmol via INTRAVENOUS
  Filled 2015-08-25: qty 10

## 2015-08-25 MED ORDER — INSULIN GLARGINE 100 UNIT/ML ~~LOC~~ SOLN
10.0000 [IU] | Freq: Every day | SUBCUTANEOUS | Status: DC
  Administered 2015-08-25 – 2015-08-29 (×5): 10 [IU] via SUBCUTANEOUS
  Filled 2015-08-25 (×6): qty 0.1

## 2015-08-25 NOTE — Progress Notes (Signed)
PULMONARY / CRITICAL CARE MEDICINE   Name: Megan Vasquez MRN: [id number] DOB: Jan 29, 1941    ADMISSION DATE:  08/28/2015 CONSULTATION DATE:  10/8  REFERRING MD :  Elvera Lennox   CHIEF COMPLAINT:  Hypotension   INITIAL PRESENTATION:  74 year old female w/ ESRD, DM, and CAF admitted 10/4 w/ infectious vs ischemic colitis. Treated conservatively w/ IVFs and abx. MS and BP worse on 10/8 w/ hypotension noted during HD so PCCM asked to assess.   STUDIES:  10/4 CT abd: 1. Apparent pneumatosis along the cecum and ascending colon at the right lower quadrant, of uncertain significance, with mild associated wall thickening and soft tissue inflammation.  ECHO 10/5: moderate LVH. Systolic function wasnormal. The estimated ejection fraction was in the range of 60%to 65%.  - Left atrium: Severely dilated. - Right ventricle: The cavity size was mildly dilated. - Right atrium: Severely dilated. - Tricuspid valve: There was moderate regurgitation. - Pulmonary arteries: PA peak pressure: 48 mm Hg (S). 10/10 CT abd: cecum and ascending colon with wall thickening and pneumatosis  SIGNIFICANT EVENTS: 10/8 -  cvvhd started  SUBJECTIVE:  Awake this morning. Tolerated liquid diet yesterday with no issues. No abdominal pain.  VITAL SIGNS: Temp:  [97.5 F (36.4 C)-98.6 F (37 C)] 98.4 F (36.9 C) (10/11 0325) Pulse Rate:  [41-105] 105 (10/11 0700) Resp:  [9-46] 12 (10/11 0715) BP: (84-119)/(27-97) 95/54 mmHg (10/11 0715) SpO2:  [0 %-98 %] 0 % (10/11 0700) Weight:  [153 lb 14.1 oz (69.8 kg)] 153 lb 14.1 oz (69.8 kg) (10/11 0500) HEMODYNAMICS:   VENTILATOR SETTINGS:   INTAKE / OUTPUT:  Intake/Output Summary (Last 24 hours) at 08/25/15 0727 Last data filed at 08/25/15 0703  Gross per 24 hour  Intake   2848 ml  Output   4021 ml  Net  -1173 ml    PHYSICAL EXAMINATION: General:  Chronically ill appearing 74yo female in NAD Neuro:  Awakens, O x 3 HEENT:  NCAT, poor dentition. No JVD, catheter  in rt ij WNL  Cardiovascular:  s1 s2 irregular Lungs: Diminished breath sounds bilaterally Abdomen:  +BS, S, NT, ND Musculoskeletal:  Intact  Skin:  Dry and intact   LABS:  CBC  Recent Labs Lab 08/23/15 1217 08/24/15 0510 08/25/15 0436  WBC 15.9* 17.8* 16.4*  HGB 9.2* 9.3* 8.5*  HCT 29.1* 30.2* 26.6*  PLT 95* 111* 115*   Coag's  Recent Labs Lab 08/23/15 0506 08/24/15 0510 08/25/15 0436  APTT 74* 77* 68*  INR 2.90* 2.71* 2.82*   BMET  Recent Labs Lab 08/24/15 0510 08/24/15 1520 08/25/15 0436  NA 134* 132* 132*  K 4.3 4.1 3.9  CL 100* 98* 98*  CO2 26 25 27   BUN 8 8 8   CREATININE 1.53* 1.49* 1.38*  GLUCOSE 228* 233* 158*   Electrolytes  Recent Labs Lab 08/23/15 0506  08/24/15 0510 08/24/15 1520 08/25/15 0436  CALCIUM 7.3*  < > 7.6* 7.2* 7.2*  MG 1.9  --  2.2  --  2.2  PHOS 2.2*  < > 1.8* 3.5 1.9*  < > = values in this interval not displayed. Sepsis Markers  Recent Labs Lab 08/28/2015 2020  08/20/15 0510 08/21/15 2328 08/22/15 0148 08/22/15 2101  LATICACIDVEN 2.3*  < > 1.8 1.3 1.4 1.1  PROCALCITON 52.76  --  35.22  --  22.42  --   < > = values in this interval not displayed. ABG  Recent Labs Lab 08/21/15 2242  PHART 7.509*  PCO2ART 36.8  PO2ART  76.7*   Liver Enzymes  Recent Labs Lab 08/19/15 0025 08/20/15 0510 08/22/15 0148  08/24/15 0510 08/24/15 1520 08/25/15 0436  AST 53* 64* 33  --   --   --   --   ALT --   --   --   --   ALKPHOS 81 73 79  --   --   --   --   BILITOT 0.6 1.0 0.9  --   --   --   --   ALBUMIN 1.6* 1.6* 1.4*  < > 1.4* 1.4* 1.4*  < > = values in this interval not displayed. Cardiac Enzymes No results for input(s): TROPONINI, PROBNP in the last 168 hours. Glucose  Recent Labs Lab 08/23/15 1610 08/23/15 2147 08/24/15 0745 08/24/15 1949 08/24/15 2356 08/25/15 0327  GLUCAP 224* 310* 215* 171* 253* 162*    Imaging Ct Abdomen Pelvis Wo Contrast  08/24/2015   CLINICAL DATA:  Pneumatosis  along cecum and ascending colon.  EXAM: CT ABDOMEN AND PELVIS WITHOUT CONTRAST  TECHNIQUE: Multidetector CT imaging of the abdomen and pelvis was performed following the standard protocol without IV contrast.  COMPARISON:  CT 08/19/2015  FINDINGS: Small right pleural effusion and trace left pleural effusion. Compressive atelectasis in the right lower lobe. There is cardiomegaly. Prior CABG. Effusions have increased since prior study.  Liver, gallbladder, spleen, pancreas, adrenals have an unremarkable unenhanced appearance. Kidneys are atrophic.  Abnormal cecum and ascending colon again noted. There is wall thickening with pneumatosis noted. Surrounding inflammatory stranding. There is free fluid adjacent to the liver and in the cul-de-sac of the pelvis. Overall appearance is similar to prior CT. No evidence of bowel obstruction.  Calcified fibroids within the uterus. Urinary bladder is grossly unremarkable. Aorta is heavily calcified, non aneurysmal.  IMPRESSION: Continued abnormal appearance of the cecum and days ascending colon with wall thickening and pneumatosis. Surrounding inflammatory change and free fluid in the abdomen and pelvis. Findings are concerning for possible bowel ischemia.  Small bilateral pleural effusions, right larger than left, increased since prior study. Right lower lobe atelectasis.  Cardiomegaly.  Atrophic kidneys.  Calcified uterine fibroids.   Electronically Signed   By: Charlett Nose M.D.   On: 08/24/2015 13:31    PCXR w/ progressive R>L edema   ASSESSMENT / PLAN:  PULMONARY A: Atelectasis  Acute pulm edema BL pleural effusions P:   Pulse ox to sats goal 92%  CARDIOVASCULAR CVL RIJ  A:  AF w/ RVR CHADVASc = 6  Severe sepsis; intermittent shock a/w HD and volume removal -Nml LA reassuring H/o PAD Progressive pulmonary edema and effusions Mod to severe MR Mild PAH P:  Stop stress dose steroids If needed add neo for map goal 55-60  RENAL A:   ESRD Baseline  dry weight is 65.5kgs. P:   For CRRT Transition to intermittent HD per nephrology Tolerates neg balance Continue midodrine  GASTROINTESTINAL A:   Bowel pneumatosis - likely ischemic Protein calorie malnutrition  P:   No indication for surgery Advance diet per surgery  HEMATOLOGIC A:   No acute bleeding Mild thrombocytopenia   Coumadin induced coagulopathy (fib, cva) s/p 2U FFP P:  Trend cbc INR daily Consider restarting coumadin vs heparin drip once INR less 2.0 (may need further procedures)  INFECTIOUS A:   Infectious vs ischemic Colitis  P:   vanc 10/6>>>10/10 Zosyn 10/4>>>10/11 eraxis 10/8>>>10/11 BCX2 10/7>>> ng  Pct  trend good  ENDOCRINE A:   DM Hypothyroidism  P:   ssi Lantus 10U nightly Cont synthroid Stress dose steroids  NEUROLOGIC A:  Mild acute encephalopathy - improving P:   Avoid benzo PT eval and treat  FAMILY  - Updates: to pt  - Inter-disciplinary family meet or Palliative Care meeting due by:  10/15  Katina Degree. Jimmey Ralph, MD [facility] Family Medicine Resident PGY-2 08/25/2015 7:31 AM

## 2015-08-25 NOTE — Progress Notes (Signed)
Subjective:  No CP/SOB or abd pain. Followed for pneumotosis  Objective:  Temp:  [97.5 F (36.4 C)-98.6 F (37 C)] 97.6 F (36.4 C) (10/11 0749) Pulse Rate:  [37-105] 37 (10/11 0745) Resp:  [9-46] 17 (10/11 0800) BP: (84-119)/(27-97) 99/54 mmHg (10/11 0800) SpO2:  [0 %-98 %] 95 % (10/11 0800) Weight:  [153 lb 14.1 oz (69.8 kg)] 153 lb 14.1 oz (69.8 kg) (10/11 0500) Weight change: 1 lb 8.7 oz (0.7 kg)  Intake/Output from previous day: 10/10 0701 - 10/11 0700 In: 2748 [P.O.:1837; I.V.:240; IV Piggyback:651] Out: 4021   Intake/Output from this shift: Total I/O In: 220 [P.O.:200; I.V.:20] Out: 378 [Other:378]  Physical Exam: General appearance: alert and no distress Neck: no adenopathy, no carotid bruit, no JVD, supple, symmetrical, trachea midline and thyroid not enlarged, symmetric, no tenderness/mass/nodules Lungs: clear to auscultation bilaterally Heart: irregularly irregular rhythm Abdomen: soft, non-tender; bowel sounds normal; no masses,  no organomegaly Extremities: extremities normal, atraumatic, no cyanosis or edema  Lab Results: Results for orders placed or performed during the hospital encounter of 09/06/2015 (from the past 48 hour(s))  Glucose, capillary     Status: Abnormal   Collection Time: 08/23/15 11:55 AM  Result Value Ref Range   Glucose-Capillary 191 (H) 65 - 99 mg/dL  CBC     Status: Abnormal   Collection Time: 08/23/15 12:17 PM  Result Value Ref Range   WBC 15.9 (H) 4.0 - 10.5 K/uL   RBC 3.08 (L) 3.87 - 5.11 MIL/uL   Hemoglobin 9.2 (L) 12.0 - 15.0 g/dL   HCT 29.1 (L) 36.0 - 46.0 %   MCV 94.5 78.0 - 100.0 fL   MCH 29.9 26.0 - 34.0 pg   MCHC 31.6 30.0 - 36.0 g/dL   RDW 16.3 (H) 11.5 - 15.5 %   Platelets 95 (L) 150 - 400 K/uL    Comment: REPEATED TO VERIFY CONSISTENT WITH PREVIOUS RESULT   Renal function panel (daily at 1600)     Status: Abnormal   Collection Time: 08/23/15  4:00 PM  Result Value Ref Range   Sodium 131 (L) 135 - 145  mmol/L   Potassium 4.3 3.5 - 5.1 mmol/L   Chloride 96 (L) 101 - 111 mmol/L   CO2 25 22 - 32 mmol/L   Glucose, Bld 232 (H) 65 - 99 mg/dL   BUN 7 6 - 20 mg/dL   Creatinine, Ser 1.73 (H) 0.44 - 1.00 mg/dL   Calcium 7.2 (L) 8.9 - 10.3 mg/dL   Phosphorus 1.8 (L) 2.5 - 4.6 mg/dL   Albumin 1.3 (L) 3.5 - 5.0 g/dL   GFR calc non Af Amer 27 (L) >60 mL/min   GFR calc Af Amer 32 (L) >60 mL/min    Comment: (NOTE) The eGFR has been calculated using the CKD EPI equation. This calculation has not been validated in all clinical situations. eGFR's persistently <60 mL/min signify possible Chronic Kidney Disease.    Anion gap 10 5 - 15  Glucose, capillary     Status: Abnormal   Collection Time: 08/23/15  4:10 PM  Result Value Ref Range   Glucose-Capillary 224 (H) 65 - 99 mg/dL  Glucose, capillary     Status: Abnormal   Collection Time: 08/23/15  9:47 PM  Result Value Ref Range   Glucose-Capillary 310 (H) 65 - 99 mg/dL  Protime-INR     Status: Abnormal   Collection Time: 08/24/15  5:10 AM  Result Value Ref Range   Prothrombin Time 28.4 (  H) 11.6 - 15.2 seconds   INR 2.71 (H) 0.00 - 1.49  Magnesium     Status: None   Collection Time: 08/24/15  5:10 AM  Result Value Ref Range   Magnesium 2.2 1.7 - 2.4 mg/dL  APTT     Status: Abnormal   Collection Time: 08/24/15  5:10 AM  Result Value Ref Range   aPTT 77 (H) 24 - 37 seconds    Comment:        IF BASELINE aPTT IS ELEVATED, SUGGEST PATIENT RISK ASSESSMENT BE USED TO DETERMINE APPROPRIATE ANTICOAGULANT THERAPY.   CBC with Differential/Platelet     Status: Abnormal   Collection Time: 08/24/15  5:10 AM  Result Value Ref Range   WBC 17.8 (H) 4.0 - 10.5 K/uL    Comment: REPEATED TO VERIFY   RBC 3.23 (L) 3.87 - 5.11 MIL/uL   Hemoglobin 9.3 (L) 12.0 - 15.0 g/dL    Comment: REPEATED TO VERIFY CONSISTENT WITH PREVIOUS RESULT    HCT 30.2 (L) 36.0 - 46.0 %   MCV 93.5 78.0 - 100.0 fL   MCH 28.8 26.0 - 34.0 pg   MCHC 30.8 30.0 - 36.0 g/dL   RDW  16.2 (H) 11.5 - 15.5 %   Platelets 111 (L) 150 - 400 K/uL    Comment: SPECIMEN CHECKED FOR CLOTS REPEATED TO VERIFY CONSISTENT WITH PREVIOUS RESULT    Neutrophils Relative % 92 %   Neutro Abs 16.4 (H) 1.7 - 7.7 K/uL   Lymphocytes Relative 3 %   Lymphs Abs 0.6 (L) 0.7 - 4.0 K/uL   Monocytes Relative 5 %   Monocytes Absolute 0.8 0.1 - 1.0 K/uL   Eosinophils Relative 0 %   Eosinophils Absolute 0.0 0.0 - 0.7 K/uL   Basophils Relative 0 %   Basophils Absolute 0.0 0.0 - 0.1 K/uL  Renal function panel     Status: Abnormal   Collection Time: 08/24/15  5:10 AM  Result Value Ref Range   Sodium 134 (L) 135 - 145 mmol/L   Potassium 4.3 3.5 - 5.1 mmol/L   Chloride 100 (L) 101 - 111 mmol/L   CO2 26 22 - 32 mmol/L   Glucose, Bld 228 (H) 65 - 99 mg/dL   BUN 8 6 - 20 mg/dL   Creatinine, Ser 1.53 (H) 0.44 - 1.00 mg/dL   Calcium 7.6 (L) 8.9 - 10.3 mg/dL   Phosphorus 1.8 (L) 2.5 - 4.6 mg/dL   Albumin 1.4 (L) 3.5 - 5.0 g/dL   GFR calc non Af Amer 32 (L) >60 mL/min   GFR calc Af Amer 37 (L) >60 mL/min    Comment: (NOTE) The eGFR has been calculated using the CKD EPI equation. This calculation has not been validated in all clinical situations. eGFR's persistently <60 mL/min signify possible Chronic Kidney Disease.    Anion gap 8 5 - 15  Glucose, capillary     Status: Abnormal   Collection Time: 08/24/15  7:45 AM  Result Value Ref Range   Glucose-Capillary 215 (H) 65 - 99 mg/dL  Renal function panel (daily at 1600)     Status: Abnormal   Collection Time: 08/24/15  3:20 PM  Result Value Ref Range   Sodium 132 (L) 135 - 145 mmol/L   Potassium 4.1 3.5 - 5.1 mmol/L   Chloride 98 (L) 101 - 111 mmol/L   CO2 25 22 - 32 mmol/L   Glucose, Bld 233 (H) 65 - 99 mg/dL   BUN 8 6 - 20 mg/dL  Creatinine, Ser 1.49 (H) 0.44 - 1.00 mg/dL   Calcium 7.2 (L) 8.9 - 10.3 mg/dL   Phosphorus 3.5 2.5 - 4.6 mg/dL   Albumin 1.4 (L) 3.5 - 5.0 g/dL   GFR calc non Af Amer 33 (L) >60 mL/min   GFR calc Af Amer 38 (L)  >60 mL/min    Comment: (NOTE) The eGFR has been calculated using the CKD EPI equation. This calculation has not been validated in all clinical situations. eGFR's persistently <60 mL/min signify possible Chronic Kidney Disease.    Anion gap 9 5 - 15  Glucose, capillary     Status: Abnormal   Collection Time: 08/24/15  7:49 PM  Result Value Ref Range   Glucose-Capillary 171 (H) 65 - 99 mg/dL  Glucose, capillary     Status: Abnormal   Collection Time: 08/24/15 11:56 PM  Result Value Ref Range   Glucose-Capillary 253 (H) 65 - 99 mg/dL  Glucose, capillary     Status: Abnormal   Collection Time: 08/25/15  3:27 AM  Result Value Ref Range   Glucose-Capillary 162 (H) 65 - 99 mg/dL  Protime-INR     Status: Abnormal   Collection Time: 08/25/15  4:36 AM  Result Value Ref Range   Prothrombin Time 29.2 (H) 11.6 - 15.2 seconds   INR 2.82 (H) 0.00 - 1.49  Magnesium     Status: None   Collection Time: 08/25/15  4:36 AM  Result Value Ref Range   Magnesium 2.2 1.7 - 2.4 mg/dL  APTT     Status: Abnormal   Collection Time: 08/25/15  4:36 AM  Result Value Ref Range   aPTT 68 (H) 24 - 37 seconds    Comment:        IF BASELINE aPTT IS ELEVATED, SUGGEST PATIENT RISK ASSESSMENT BE USED TO DETERMINE APPROPRIATE ANTICOAGULANT THERAPY.   CBC     Status: Abnormal   Collection Time: 08/25/15  4:36 AM  Result Value Ref Range   WBC 16.4 (H) 4.0 - 10.5 K/uL   RBC 2.85 (L) 3.87 - 5.11 MIL/uL   Hemoglobin 8.5 (L) 12.0 - 15.0 g/dL   HCT 26.6 (L) 36.0 - 46.0 %   MCV 93.3 78.0 - 100.0 fL   MCH 29.8 26.0 - 34.0 pg   MCHC 32.0 30.0 - 36.0 g/dL   RDW 15.9 (H) 11.5 - 15.5 %   Platelets 115 (L) 150 - 400 K/uL    Comment: REPEATED TO VERIFY CONSISTENT WITH PREVIOUS RESULT   Renal function panel     Status: Abnormal   Collection Time: 08/25/15  4:36 AM  Result Value Ref Range   Sodium 132 (L) 135 - 145 mmol/L   Potassium 3.9 3.5 - 5.1 mmol/L   Chloride 98 (L) 101 - 111 mmol/L   CO2 27 22 - 32 mmol/L    Glucose, Bld 158 (H) 65 - 99 mg/dL   BUN 8 6 - 20 mg/dL   Creatinine, Ser 1.38 (H) 0.44 - 1.00 mg/dL   Calcium 7.2 (L) 8.9 - 10.3 mg/dL   Phosphorus 1.9 (L) 2.5 - 4.6 mg/dL   Albumin 1.4 (L) 3.5 - 5.0 g/dL   GFR calc non Af Amer 36 (L) >60 mL/min   GFR calc Af Amer 42 (L) >60 mL/min    Comment: (NOTE) The eGFR has been calculated using the CKD EPI equation. This calculation has not been validated in all clinical situations. eGFR's persistently <60 mL/min signify possible Chronic Kidney Disease.    Anion  gap 7 5 - 15    Imaging: Imaging results have been reviewed  Tele- AFib with VR 100-110  Assessment/Plan:   1. Principal Problem: 2.   Atrial fibrillation with RVR ([place]) 3. Active Problems: 4.   Long term current use of anticoagulant therapy 5.   Diabetes mellitus, insulin dependent (IDDM), controlled ([place]) 6.   HTN (hypertension) 7.   CKD (chronic kidney disease) stage V requiring chronic dialysis ([place]) 8.   HLD (hyperlipidemia) 9.   Coronary artery disease 10.   Severe mitral regurgitation 11.   RLQ abdominal pain 12.   Thrombocytopenia ([place]) 13.   Anemia, chronic renal failure 14.   Hypothyroidism 15.   Metabolic encephalopathy 16.   Sepsis ([place]) 17.   Fever, unspecified 18.   Leukocytosis 19.   PAD (peripheral artery disease) ([place]) 20.   Right foot pain 21.   Acute respiratory failure with hypoxia ([facility]) 22.   Hemodialysis catheter dysfunction ([place]) 23.   Pneumatosis coli 24.   Time Spent Directly with Patient:  20 minutes  Length of Stay:  LOS: 7 days   We are following for Afib with RVR. HR now approp. VSS . Off pressors. INR slowly dropping (still above 2.0). Coumadin on hold. Agree with start IV hep once INR <2.0. Getting CVVHD. Scr decreasing. Abd is soft and NT. Diet is advancing. Results of 2D noted with nl LV function, mod-sever MR. Pulm edema improving by CXR. Nothing further to add. Abd process appears to be stable, medically managed with no intent  to perform surgery. Restart Coumadin when sure that she will not need intervention. Will S/O. Call if we can be of further assistance.  Quay Burow 08/25/2015, 9:13 AM

## 2015-08-25 NOTE — Progress Notes (Signed)
Patient ID: Megan Vasquez, female   DOB: 1941/07/02, 74 y.o.   MRN: [id number]     [facility]      [address] [place], [place] [address]    Phone: [phone] FAX: [phone]     Subjective: Having small loose BMs.  BP low.  Wbc trending down.  Afebrile.  Denies n/v, abdominal pain.  Tolerating clears, hungry today.   Objective:  Vital signs:  Filed Vitals:   08/25/15 0515 08/25/15 0530 08/25/15 0545 08/25/15 0600  BP: _0 88/48  Pulse:   57 41  Temp:      TempSrc:      Resp: _1 Height:      Weight:      SpO2:        Last BM Date: 08/24/15  Intake/Output   Yesterday:  10/10 0701 - 10/11 0700 In: 2748 [P.O.:1837; I.V.:240; IV Piggyback:651] Out: 4021  This shift:  Total I/O In: 100 [P.O.:100] Out: -    Physical Exam: General: Pt awake/alert/oriented x4 in no acute distress  Abdomen: Soft. Nondistended. Non tender. No evidence of peritonitis. No incarcerated hernias.   Problem List:   Principal Problem:   Atrial fibrillation with RVR (HCC) Active Problems:   Long term current use of anticoagulant therapy   Diabetes mellitus, insulin dependent (IDDM), controlled (HCC)   HTN (hypertension)   CKD (chronic kidney disease) stage V requiring chronic dialysis (HCC)   HLD (hyperlipidemia)   Coronary artery disease   Severe mitral regurgitation   RLQ abdominal pain   Thrombocytopenia (HCC)   Anemia, chronic renal failure   Hypothyroidism   Metabolic encephalopathy   Sepsis (HCC)   Fever, unspecified   Leukocytosis   PAD (peripheral artery disease) (HCC)   Right foot pain   Acute respiratory failure with hypoxia (HCC)   Hemodialysis catheter dysfunction ([place])   Pneumatosis coli    Results:   Labs: Results for orders placed or performed during the hospital encounter of 08/23/2015 (from the past 48 hour(s))  Glucose, capillary     Status: Abnormal   Collection Time:  08/23/15  7:49 AM  Result Value Ref Range   Glucose-Capillary 162 (H) 65 - 99 mg/dL  Glucose, capillary     Status: Abnormal   Collection Time: 08/23/15 11:55 AM  Result Value Ref Range   Glucose-Capillary 191 (H) 65 - 99 mg/dL  CBC     Status: Abnormal   Collection Time: 08/23/15 12:17 PM  Result Value Ref Range   WBC 15.9 (H) 4.0 - 10.5 K/uL   RBC 3.08 (L) 3.87 - 5.11 MIL/uL   Hemoglobin 9.2 (L) 12.0 - 15.0 g/dL   HCT 29.1 (L) 36.0 - 46.0 %   MCV 94.5 78.0 - 100.0 fL   MCH 29.9 26.0 - 34.0 pg   MCHC 31.6 30.0 - 36.0 g/dL   RDW 16.3 (H) 11.5 - 15.5 %   Platelets 95 (L) 150 - 400 K/uL    Comment: REPEATED TO VERIFY CONSISTENT WITH PREVIOUS RESULT   Renal function panel (daily at 1600)     Status: Abnormal   Collection Time: 08/23/15  4:00 PM  Result Value Ref Range   Sodium 131 (L) 135 - 145 mmol/L   Potassium 4.3 3.5 - 5.1 mmol/L   Chloride 96 (L) 101 - 111 mmol/L   CO2 25 22 - 32 mmol/L   Glucose, Bld 232 (H) 65 - 99 mg/dL  BUN 7 6 - 20 mg/dL   Creatinine, Ser 1.73 (H) 0.44 - 1.00 mg/dL   Calcium 7.2 (L) 8.9 - 10.3 mg/dL   Phosphorus 1.8 (L) 2.5 - 4.6 mg/dL   Albumin 1.3 (L) 3.5 - 5.0 g/dL   GFR calc non Af Amer 27 (L) >60 mL/min   GFR calc Af Amer 32 (L) >60 mL/min    Comment: (NOTE) The eGFR has been calculated using the CKD EPI equation. This calculation has not been validated in all clinical situations. eGFR's persistently <60 mL/min signify possible Chronic Kidney Disease.    Anion gap 10 5 - 15  Glucose, capillary     Status: Abnormal   Collection Time: 08/23/15  4:10 PM  Result Value Ref Range   Glucose-Capillary 224 (H) 65 - 99 mg/dL  Glucose, capillary     Status: Abnormal   Collection Time: 08/23/15  9:47 PM  Result Value Ref Range   Glucose-Capillary 310 (H) 65 - 99 mg/dL  Protime-INR     Status: Abnormal   Collection Time: 08/24/15  5:10 AM  Result Value Ref Range   Prothrombin Time 28.4 (H) 11.6 - 15.2 seconds   INR 2.71 (H) 0.00 - 1.49   Magnesium     Status: None   Collection Time: 08/24/15  5:10 AM  Result Value Ref Range   Magnesium 2.2 1.7 - 2.4 mg/dL  APTT     Status: Abnormal   Collection Time: 08/24/15  5:10 AM  Result Value Ref Range   aPTT 77 (H) 24 - 37 seconds    Comment:        IF BASELINE aPTT IS ELEVATED, SUGGEST PATIENT RISK ASSESSMENT BE USED TO DETERMINE APPROPRIATE ANTICOAGULANT THERAPY.   CBC with Differential/Platelet     Status: Abnormal   Collection Time: 08/24/15  5:10 AM  Result Value Ref Range   WBC 17.8 (H) 4.0 - 10.5 K/uL    Comment: REPEATED TO VERIFY   RBC 3.23 (L) 3.87 - 5.11 MIL/uL   Hemoglobin 9.3 (L) 12.0 - 15.0 g/dL    Comment: REPEATED TO VERIFY CONSISTENT WITH PREVIOUS RESULT    HCT 30.2 (L) 36.0 - 46.0 %   MCV 93.5 78.0 - 100.0 fL   MCH 28.8 26.0 - 34.0 pg   MCHC 30.8 30.0 - 36.0 g/dL   RDW 16.2 (H) 11.5 - 15.5 %   Platelets 111 (L) 150 - 400 K/uL    Comment: SPECIMEN CHECKED FOR CLOTS REPEATED TO VERIFY CONSISTENT WITH PREVIOUS RESULT    Neutrophils Relative % 92 %   Neutro Abs 16.4 (H) 1.7 - 7.7 K/uL   Lymphocytes Relative 3 %   Lymphs Abs 0.6 (L) 0.7 - 4.0 K/uL   Monocytes Relative 5 %   Monocytes Absolute 0.8 0.1 - 1.0 K/uL   Eosinophils Relative 0 %   Eosinophils Absolute 0.0 0.0 - 0.7 K/uL   Basophils Relative 0 %   Basophils Absolute 0.0 0.0 - 0.1 K/uL  Renal function panel     Status: Abnormal   Collection Time: 08/24/15  5:10 AM  Result Value Ref Range   Sodium 134 (L) 135 - 145 mmol/L   Potassium 4.3 3.5 - 5.1 mmol/L   Chloride 100 (L) 101 - 111 mmol/L   CO2 26 22 - 32 mmol/L   Glucose, Bld 228 (H) 65 - 99 mg/dL   BUN 8 6 - 20 mg/dL   Creatinine, Ser 1.53 (H) 0.44 - 1.00 mg/dL   Calcium 7.6 (L)  8.9 - 10.3 mg/dL   Phosphorus 1.8 (L) 2.5 - 4.6 mg/dL   Albumin 1.4 (L) 3.5 - 5.0 g/dL   GFR calc non Af Amer 32 (L) >60 mL/min   GFR calc Af Amer 37 (L) >60 mL/min    Comment: (NOTE) The eGFR has been calculated using the CKD EPI equation. This  calculation has not been validated in all clinical situations. eGFR's persistently <60 mL/min signify possible Chronic Kidney Disease.    Anion gap 8 5 - 15  Glucose, capillary     Status: Abnormal   Collection Time: 08/24/15  7:45 AM  Result Value Ref Range   Glucose-Capillary 215 (H) 65 - 99 mg/dL  Renal function panel (daily at 1600)     Status: Abnormal   Collection Time: 08/24/15  3:20 PM  Result Value Ref Range   Sodium 132 (L) 135 - 145 mmol/L   Potassium 4.1 3.5 - 5.1 mmol/L   Chloride 98 (L) 101 - 111 mmol/L   CO2 25 22 - 32 mmol/L   Glucose, Bld 233 (H) 65 - 99 mg/dL   BUN 8 6 - 20 mg/dL   Creatinine, Ser 1.49 (H) 0.44 - 1.00 mg/dL   Calcium 7.2 (L) 8.9 - 10.3 mg/dL   Phosphorus 3.5 2.5 - 4.6 mg/dL   Albumin 1.4 (L) 3.5 - 5.0 g/dL   GFR calc non Af Amer 33 (L) >60 mL/min   GFR calc Af Amer 38 (L) >60 mL/min    Comment: (NOTE) The eGFR has been calculated using the CKD EPI equation. This calculation has not been validated in all clinical situations. eGFR's persistently <60 mL/min signify possible Chronic Kidney Disease.    Anion gap 9 5 - 15  Glucose, capillary     Status: Abnormal   Collection Time: 08/24/15  7:49 PM  Result Value Ref Range   Glucose-Capillary 171 (H) 65 - 99 mg/dL  Glucose, capillary     Status: Abnormal   Collection Time: 08/24/15 11:56 PM  Result Value Ref Range   Glucose-Capillary 253 (H) 65 - 99 mg/dL  Glucose, capillary     Status: Abnormal   Collection Time: 08/25/15  3:27 AM  Result Value Ref Range   Glucose-Capillary 162 (H) 65 - 99 mg/dL  Protime-INR     Status: Abnormal   Collection Time: 08/25/15  4:36 AM  Result Value Ref Range   Prothrombin Time 29.2 (H) 11.6 - 15.2 seconds   INR 2.82 (H) 0.00 - 1.49  Magnesium     Status: None   Collection Time: 08/25/15  4:36 AM  Result Value Ref Range   Magnesium 2.2 1.7 - 2.4 mg/dL  APTT     Status: Abnormal   Collection Time: 08/25/15  4:36 AM  Result Value Ref Range   aPTT 68 (H) 24  - 37 seconds    Comment:        IF BASELINE aPTT IS ELEVATED, SUGGEST PATIENT RISK ASSESSMENT BE USED TO DETERMINE APPROPRIATE ANTICOAGULANT THERAPY.   CBC     Status: Abnormal   Collection Time: 08/25/15  4:36 AM  Result Value Ref Range   WBC 16.4 (H) 4.0 - 10.5 K/uL   RBC 2.85 (L) 3.87 - 5.11 MIL/uL   Hemoglobin 8.5 (L) 12.0 - 15.0 g/dL   HCT 26.6 (L) 36.0 - 46.0 %   MCV 93.3 78.0 - 100.0 fL   MCH 29.8 26.0 - 34.0 pg   MCHC 32.0 30.0 - 36.0 g/dL   RDW 15.9 (  H) 11.5 - 15.5 %   Platelets 115 (L) 150 - 400 K/uL    Comment: REPEATED TO VERIFY CONSISTENT WITH PREVIOUS RESULT   Renal function panel     Status: Abnormal   Collection Time: 08/25/15  4:36 AM  Result Value Ref Range   Sodium 132 (L) 135 - 145 mmol/L   Potassium 3.9 3.5 - 5.1 mmol/L   Chloride 98 (L) 101 - 111 mmol/L   CO2 27 22 - 32 mmol/L   Glucose, Bld 158 (H) 65 - 99 mg/dL   BUN 8 6 - 20 mg/dL   Creatinine, Ser 1.38 (H) 0.44 - 1.00 mg/dL   Calcium 7.2 (L) 8.9 - 10.3 mg/dL   Phosphorus 1.9 (L) 2.5 - 4.6 mg/dL   Albumin 1.4 (L) 3.5 - 5.0 g/dL   GFR calc non Af Amer 36 (L) >60 mL/min   GFR calc Af Amer 42 (L) >60 mL/min    Comment: (NOTE) The eGFR has been calculated using the CKD EPI equation. This calculation has not been validated in all clinical situations. eGFR's persistently <60 mL/min signify possible Chronic Kidney Disease.    Anion gap 7 5 - 15    Imaging / Studies: Ct Abdomen Pelvis Wo Contrast  08/24/2015   CLINICAL DATA:  Pneumatosis along cecum and ascending colon.  EXAM: CT ABDOMEN AND PELVIS WITHOUT CONTRAST  TECHNIQUE: Multidetector CT imaging of the abdomen and pelvis was performed following the standard protocol without IV contrast.  COMPARISON:  CT 08/19/2015  FINDINGS: Small right pleural effusion and trace left pleural effusion. Compressive atelectasis in the right lower lobe. There is cardiomegaly. Prior CABG. Effusions have increased since prior study.  Liver, gallbladder, spleen,  pancreas, adrenals have an unremarkable unenhanced appearance. Kidneys are atrophic.  Abnormal cecum and ascending colon again noted. There is wall thickening with pneumatosis noted. Surrounding inflammatory stranding. There is free fluid adjacent to the liver and in the cul-de-sac of the pelvis. Overall appearance is similar to prior CT. No evidence of bowel obstruction.  Calcified fibroids within the uterus. Urinary bladder is grossly unremarkable. Aorta is heavily calcified, non aneurysmal.  IMPRESSION: Continued abnormal appearance of the cecum and days ascending colon with wall thickening and pneumatosis. Surrounding inflammatory change and free fluid in the abdomen and pelvis. Findings are concerning for possible bowel ischemia.  Small bilateral pleural effusions, right larger than left, increased since prior study. Right lower lobe atelectasis.  Cardiomegaly.  Atrophic kidneys.  Calcified uterine fibroids.   Electronically Signed   By: Rolm Baptise M.D.   On: 08/24/2015 13:31   Dg Chest Port 1 View  08/24/2015   CLINICAL DATA:  Follow-up pleural effusion, coronary artery disease, mitral regurgitation, chronic renal insufficiency, dialysis dependent.  EXAM: PORTABLE CHEST 1 VIEW  COMPARISON:  Portable chest x-ray of August 23, 2015.  FINDINGS: The lungs are reasonably well inflated. There is persistent moderate size right pleural effusion and small left pleural effusion. There is bibasilar atelectasis greater on the right. The cardiac silhouette is mildly enlarged. The pulmonary vascularity is less engorged today. The right internal jugular venous catheter tip projects over the proximal third of the SVC. There are 7 intact sternal wires.  IMPRESSION: Slight interval improvement in pulmonary interstitial edema and pulmonary vascular congestion. Persistent bilateral pleural effusions and basilar atelectasis greater on the right.   Electronically Signed   By: David  Martinique M.D.   On: 08/24/2015 07:12   Dg  Chest Port 1 View  08/23/2015   CLINICAL  DATA:  Follow-up of pulmonary edema.  EXAM: PORTABLE CHEST 1 VIEW  COMPARISON:  08/22/2015  FINDINGS: Right internal jugular approach central venous catheter is unchanged, with tip projecting over the expected location of mid sub superior vena cava. Postsurgical changes are stable.  Cardiomediastinal silhouette is enlarged. Mediastinal contours appear intact.  There is no evidence of pneumothorax. There is persistent prominence of interstitial markings with bilateral pleural effusions, right greater than left, and bibasilar airspace consolidation.  Osseous structures are without acute abnormality. Soft tissues are grossly normal.  IMPRESSION: Persistent appearance of pulmonary edema with bilateral pleural effusions.  Stably enlarged cardiac silhouette.   Electronically Signed   By: Fidela Salisbury M.D.   On: 08/23/2015 09:43    Medications / Allergies:  Scheduled Meds: . darbepoetin (ARANESP) injection - DIALYSIS  150 mcg Intravenous Q Tue-HD  . doxercalciferol  1 mcg Intravenous Q T,Th,Sa-HD  . feeding supplement  1 Container Oral TID BM  . hydrocortisone sodium succinate  50 mg Intravenous Q6H  . insulin aspart  0-15 Units Subcutaneous 6 times per day  . levothyroxine  250 mcg Oral QAC breakfast  . midodrine  10 mg Oral TID WC  . multivitamin  1 tablet Oral QHS  . piperacillin-tazobactam (ZOSYN)  IV  2.25 g Intravenous Q6H  . pneumococcal 23 valent vaccine  0.5 mL Intramuscular Tomorrow-1000  . pravastatin  40 mg Oral q1800  . sodium chloride  3 mL Intravenous Q12H   Continuous Infusions: . dialysis replacement fluid (prismasate) 400 mL/hr at 08/25/15 0140  . dialysis replacement fluid (prismasate) 200 mL/hr at 08/25/15 0113  . dialysate (PRISMASATE) 1,800 mL/hr at 08/25/15 0443   PRN Meds:.sodium chloride, alteplase, heparin, metoprolol, ondansetron **OR** ondansetron (ZOFRAN) IV, sodium chloride, sodium chloride  Antibiotics: Anti-infectives     Start     Dose/Rate Route Frequency Ordered Stop   08/23/15 1800  vancomycin (VANCOCIN) IVPB 750 mg/150 ml premix  Status:  Discontinued     750 mg 150 mL/hr over 60 Minutes Intravenous Every 24 hours 08/22/15 1419 08/22/15 1422   08/23/15 1300  anidulafungin (ERAXIS) 100 mg in sodium chloride 0.9 % 100 mL IVPB  Status:  Discontinued     100 mg over 90 Minutes Intravenous Every 24 hours 08/22/15 1236 08/24/15 1001   08/23/15 0800  vancomycin (VANCOCIN) IVPB 750 mg/150 ml premix  Status:  Discontinued     750 mg 150 mL/hr over 60 Minutes Intravenous Every 24 hours 08/22/15 1422 08/24/15 1331   08/22/15 1600  piperacillin-tazobactam (ZOSYN) IVPB 2.25 g     2.25 g 100 mL/hr over 30 Minutes Intravenous Every 6 hours 08/22/15 1419     08/22/15 1300  anidulafungin (ERAXIS) 200 mg in sodium chloride 0.9 % 200 mL IVPB     200 mg over 180 Minutes Intravenous  Once 08/22/15 1234 08/22/15 2029   08/22/15 1215  anidulafungin (ERAXIS) 200 mg in sodium chloride 0.9 % 200 mL IVPB  Status:  Discontinued     200 mg over 180 Minutes Intravenous Every 24 hours 08/22/15 1208 08/22/15 1234   08/22/15 1200  vancomycin (VANCOCIN) IVPB 750 mg/150 ml premix  Status:  Discontinued     750 mg 150 mL/hr over 60 Minutes Intravenous Every T-Th-Sa (Hemodialysis) 08/21/15 0602 08/22/15 1407   08/21/15 0615  vancomycin (VANCOCIN) 1,500 mg in sodium chloride 0.9 % 500 mL IVPB     1,500 mg 250 mL/hr over 120 Minutes Intravenous  Once 08/21/15 0602 08/21/15 0844   08/19/15 0600  piperacillin-tazobactam (ZOSYN) IVPB 2.25 g  Status:  Discontinued    Comments:  Zosyn 2.25 g IV q8h in ESRD   2.25 g 100 mL/hr over 30 Minutes Intravenous 3 times per day 08/19/15 0539 08/22/15 1407       Assessment/Plan Abdominal pain/pneumatosis-CT revealed pneumatosis, colonic thickening.  WBC trending down, afebrile, benign abdominal exam.  No indications for surgical intervention.  Advance to fulls and follow clinically.  ID-vanc  D#5 zosyn D#7 CV-AF, mod-sev MVR, hypotension, cards following Renal-CVVHD Heme-coumadin on hold, INR 2.8, thrombocytopenia  Endo-DM, AI on stress steroids, hypothyroid  FEN-fulls  Erby Pian, ANP-BC [facility] Pager [phone](7A-4:30P) For consults and floor pages call [phone](7A-4:30P)  08/25/2015 7:17 AM

## 2015-08-25 NOTE — Procedures (Signed)
Admit: 09/01/2015 LOS: 7  60F ESRD [facility] via AVF w/ pulmonary edema/hypervolemia, colonic pneumatosis, permAFib, known ASCVD.  Current CRRT Prescription: Start Date: 08/22/15 Catheter: R IJ [facility] 08/22/15 nontunneled BFR: 200 4K Pre Blood Pump: 400 4K DFR: 1800 4K Replacement Rate: 200 Goal UF: 38mL/hr net negative Anticoagulation: systemic anticoagulation Clotting: none since beginning   S: Doing well Taking liquids CT A/P yeterday ongoing pneumatosis Tolerating CRRT, ongoing UF w/o needing pressors No c/o this AM K/HCO3 ok Phos  Is low today, repleted yesterday  WBC down a little  O: 10/10 0701 - 10/11 0700 In: 2748 [P.O.:1837; I.V.:240; IV Piggyback:651] Out: 4021   Filed Weights   08/23/15 0400 08/24/15 0500 08/25/15 0500  Weight: 69.6 kg (153 lb 7 oz) 69.1 kg (152 lb 5.4 oz) 69.8 kg (153 lb 14.1 oz)     Recent Labs Lab 08/24/15 0510 08/24/15 1520 08/25/15 0436  NA 134* 132* 132*  K 4.3 4.1 3.9  CL 100* 98* 98*  CO2 GLUCOSE 228* 233* 158*  BUN CREATININE 1.53* 1.49* 1.38*  CALCIUM 7.6* 7.2* 7.2*  PHOS 1.8* 3.5 1.9*    Recent Labs Lab 08/28/2015 1120  08/23/15 1217 08/24/15 0510 08/25/15 0436  WBC 9.7  < > 15.9* 17.8* 16.4*  NEUTROABS 8.9*  --   --  16.4*  --   HGB 8.8*  < > 9.2* 9.3* 8.5*  HCT 29.4*  < > 29.1* 30.2* 26.6*  MCV 98.3  < > 94.5 93.5 93.3  PLT 80*  < > 95* 111* 115*  < > = values in this interval not displayed.  Scheduled Meds: . darbepoetin (ARANESP) injection - DIALYSIS  150 mcg Intravenous Q Tue-HD  . doxercalciferol  1 mcg Intravenous Q T,Th,Sa-HD  . feeding supplement  1 Container Oral TID BM  . hydrocortisone sodium succinate  50 mg Intravenous Q6H  . insulin aspart  0-15 Units Subcutaneous 6 times per day  . levothyroxine  250 mcg Oral QAC breakfast  . midodrine  10 mg Oral TID WC  . multivitamin  1 tablet Oral QHS  . piperacillin-tazobactam (ZOSYN)  IV  2.25 g Intravenous Q6H  . pneumococcal 23 valent  vaccine  0.5 mL Intramuscular Tomorrow-1000  . pravastatin  40 mg Oral q1800  . sodium chloride  3 mL Intravenous Q12H  . sodium phosphate  Dextrose 5% IVPB  30 mmol Intravenous Once   Continuous Infusions: . dialysis replacement fluid (prismasate) 400 mL/hr at 08/25/15 0140  . dialysis replacement fluid (prismasate) 200 mL/hr at 08/25/15 0113  . dialysate (PRISMASATE) 1,800 mL/hr at 08/25/15 0748   PRN Meds:.sodium chloride, alteplase, heparin, metoprolol, ondansetron **OR** ondansetron (ZOFRAN) IV, sodium chloride, sodium chloride  ABG    Component Value Date/Time   PHART 7.509* 08/21/2015 2242   PCO2ART 36.8 08/21/2015 2242   PO2ART 76.7* 08/21/2015 2242   HCO3 29.1* 08/21/2015 2242   TCO2 30.2 08/21/2015 2242   O2SAT 92.9 08/21/2015 2242    Outpt Dialysis Orders: TTS [facility] 4 hrs 65.5kgs 2K/2Ca L AVF 1000u hep hectorol 2 venofor 100 q HD until 10/8 then 50 q week Micera 100 q2 weeks - to start 10/6  A/P  1. 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 Outpt LUE AVF, using TDC R IJ now 2. No anticoagulation in RRT Circuit 3. Tolerating UF, cont at current settings 4. K ok, low Phos at 1.9, replete again today 5. Probably could begin transition to iHD, will cont CRRT but if  hypotension or filter clots WILL NOT RESTART and reassess first 2.  Hypotension during HD 10/8 1. Low/stable off pressors 2. Cont with mild RVR  3. On stress dose steroids for relative AI 4. On midodrine 3. ?colonic ischemia -- CCS following, on ABX, benign abd; on vanc/zosyn/anidulafungin; advancing diet 4. Permanent AFib: Cardiology following 5. ASCVD/hx/o CABG 6. Anemia, stable, monitor 1. ESA ASranesp qTues  Sabra Heck, MD [employer] pgr [phone]

## 2015-08-26 LAB — CULTURE, BLOOD (ROUTINE X 2)
CULTURE: NO GROWTH
Culture: NO GROWTH

## 2015-08-26 LAB — RENAL FUNCTION PANEL
ALBUMIN: 1.5 g/dL — AB (ref 3.5–5.0)
ANION GAP: 8 (ref 5–15)
Albumin: 1.5 g/dL — ABNORMAL LOW (ref 3.5–5.0)
Anion gap: 7 (ref 5–15)
BUN: 8 mg/dL (ref 6–20)
BUN: 9 mg/dL (ref 6–20)
CHLORIDE: 99 mmol/L — AB (ref 101–111)
CO2: 27 mmol/L (ref 22–32)
CO2: 26 mmol/L (ref 22–32)
CREATININE: 2.06 mg/dL — AB (ref 0.44–1.00)
Calcium: 7.6 mg/dL — ABNORMAL LOW (ref 8.9–10.3)
Calcium: 7.9 mg/dL — ABNORMAL LOW (ref 8.9–10.3)
Chloride: 99 mmol/L — ABNORMAL LOW (ref 101–111)
Creatinine, Ser: 1.23 mg/dL — ABNORMAL HIGH (ref 0.44–1.00)
GFR calc Af Amer: 48 mL/min — ABNORMAL LOW (ref >60)
GFR, EST AFRICAN AMERICAN: 26 mL/min — AB (ref >60)
GFR, EST NON AFRICAN AMERICAN: 42 mL/min — AB (ref >60)
GFR, EST NON AFRICAN AMERICAN: 22 mL/min — AB (ref >60)
GLUCOSE: 122 mg/dL — AB (ref 65–99)
Glucose, Bld: 259 mg/dL — ABNORMAL HIGH (ref 65–99)
PHOSPHORUS: 2 mg/dL — AB (ref 2.5–4.6)
PHOSPHORUS: 1.6 mg/dL — AB (ref 2.5–4.6)
POTASSIUM: 3.9 mmol/L (ref 3.5–5.1)
Potassium: 4.7 mmol/L (ref 3.5–5.1)
SODIUM: 134 mmol/L — AB (ref 135–145)
Sodium: 132 mmol/L — ABNORMAL LOW (ref 135–145)

## 2015-08-26 LAB — MAGNESIUM: MAGNESIUM: 2.3 mg/dL (ref 1.7–2.4)

## 2015-08-26 LAB — CBC
HCT: 26.9 % — ABNORMAL LOW (ref 36.0–46.0)
Hemoglobin: 8.4 g/dL — ABNORMAL LOW (ref 12.0–15.0)
MCH: 29.6 pg (ref 26.0–34.0)
MCHC: 31.2 g/dL (ref 30.0–36.0)
MCV: 94.7 fL (ref 78.0–100.0)
PLATELETS: 121 10*3/uL — AB (ref 150–400)
RBC: 2.84 MIL/uL — ABNORMAL LOW (ref 3.87–5.11)
RDW: 16 % — AB (ref 11.5–15.5)
WBC: 13.7 10*3/uL — AB (ref 4.0–10.5)

## 2015-08-26 LAB — GLUCOSE, CAPILLARY
GLUCOSE-CAPILLARY: 64 mg/dL — AB (ref 65–99)
GLUCOSE-CAPILLARY: 296 mg/dL — AB (ref 65–99)
Glucose-Capillary: 183 mg/dL — ABNORMAL HIGH (ref 65–99)

## 2015-08-26 LAB — PROTIME-INR
INR: 2.19 — AB (ref 0.00–1.49)
PROTHROMBIN TIME: 24.2 s — AB (ref 11.6–15.2)

## 2015-08-26 LAB — APTT: APTT: 63 s — AB (ref 24–37)

## 2015-08-26 MED ORDER — WARFARIN - PHARMACIST DOSING INPATIENT: Freq: Every day | Status: DC

## 2015-08-26 MED ORDER — WARFARIN SODIUM 4 MG PO TABS
4.5000 mg | ORAL_TABLET | Freq: Once | ORAL | Status: AC
  Administered 2015-08-26: 4.5 mg via ORAL
  Filled 2015-08-26: qty 1

## 2015-08-26 NOTE — Progress Notes (Signed)
ANTICOAGULATION CONSULT NOTE - Follow Up Consult  Pharmacy Consult for Warfarin Indication: atrial fibrillation  No Known Allergies  Patient Measurements: Height: 5\' 7"  (170.2 cm) Weight: 147 lb 11.3 oz (67 kg) IBW/kg (Calculated) : 61.6  Vital Signs: Temp: 97.8 F (36.6 C) (10/12 1204) Temp Source: Oral (10/12 1204) BP: 106/55 mmHg (10/12 1300) Pulse Rate: 110 (10/12 1300)  Labs:  Recent Labs  08/24/15 0510  08/25/15 0436 08/25/15 1515 08/26/15 0433  HGB 9.3*  --  8.5*  --  8.4*  HCT 30.2*  --  26.6*  --  26.9*  PLT 111*  --  115*  --  121*  APTT 77*  --  68*  --  63*  LABPROT 28.4*  --  29.2*  --  24.2*  INR 2.71*  --  2.82*  --  2.19*  CREATININE 1.53*  < > 1.38* 1.33* 1.23*  < > = values in this interval not displayed.  Estimated Creatinine Clearance: 37.8 mL/min (by C-G formula based on Cr of 1.23).   Assessment: Pt. W/ ESRD on iHD presenting with 2 day hx of decreased appetite and AMS. On coumadin prior to admission for aFib.  Home warfarin dose = 6 mg on Mondays, 4.5 mg all other days). INR therapeutic on admitPreviously holding (last dose 10/5). INR today 2.19. Will re-start home warfarin dose.  Hgb 8.4, PLT 121, Anemia of chronic thrombocytopenia (Plt typically ~ 120K).   Goal of Therapy:  INR 2-3   Plan:  Warfarin 4.5mg  X1 tonight Daily INR/CBC Monitor for s/s of bleeding

## 2015-08-26 NOTE — Progress Notes (Signed)
Report has been called to charge RN. Patient is alert and vitals are stable. Bp 102/49 HR 118

## 2015-08-26 NOTE — Progress Notes (Signed)
Subjective: On crrt, awake, alert, no abd pain, says she had bm and tolerated diet fine  Objective: Vital signs in last 24 hours: Temp:  [97.3 F (36.3 C)-97.6 F (36.4 C)] 97.3 F (36.3 C) (10/12 0340) Pulse Rate:  [27-102] 27 (10/12 0500) Resp:  [14-44] 21 (10/12 0726) BP: (87-127)/(42-78) 119/53 mmHg (10/12 0700) SpO2:  [95 %-100 %] 100 % (10/12 0500) Weight:  [67 kg (147 lb 11.3 oz)] 67 kg (147 lb 11.3 oz) (10/12 0500) Last BM Date: 08/25/15  Intake/Output from previous day: 10/11 0701 - 10/12 0700 In: 1358 [P.O.:810; I.V.:240; IV Piggyback:308] Out: 3171  Intake/Output this shift:   abd soft nontender nondistended bowel sounds present   Lab Results:   Recent Labs  08/25/15 0436 08/26/15 0433  WBC 16.4* 13.7*  HGB 8.5* 8.4*  HCT 26.6* 26.9*  PLT 115* 121*   BMET  Recent Labs  08/25/15 1515 08/26/15 0433  NA 132* 134*  K 4.1 3.9  CL 98* 99*  CO2 26 27  GLUCOSE 311* 122*  BUN 10 8  CREATININE 1.33* 1.23*  CALCIUM 7.2* 7.6*   PT/INR  Recent Labs  08/25/15 0436 08/26/15 0433  LABPROT 29.2* 24.2*  INR 2.82* 2.19*   ABG No results for input(s): PHART, HCO3 in the last 72 hours.  Invalid input(s): PCO2, PO2  Studies/Results: Ct Abdomen Pelvis Wo Contrast  08/24/2015  CLINICAL DATA:  Pneumatosis along cecum and ascending colon. EXAM: CT ABDOMEN AND PELVIS WITHOUT CONTRAST TECHNIQUE: Multidetector CT imaging of the abdomen and pelvis was performed following the standard protocol without IV contrast. COMPARISON:  CT 08/19/2015 FINDINGS: Small right pleural effusion and trace left pleural effusion. Compressive atelectasis in the right lower lobe. There is cardiomegaly. Prior CABG. Effusions have increased since prior study. Liver, gallbladder, spleen, pancreas, adrenals have an unremarkable unenhanced appearance. Kidneys are atrophic. Abnormal cecum and ascending colon again noted. There is wall thickening with pneumatosis noted. Surrounding  inflammatory stranding. There is free fluid adjacent to the liver and in the cul-de-sac of the pelvis. Overall appearance is similar to prior CT. No evidence of bowel obstruction. Calcified fibroids within the uterus. Urinary bladder is grossly unremarkable. Aorta is heavily calcified, non aneurysmal. IMPRESSION: Continued abnormal appearance of the cecum and days ascending colon with wall thickening and pneumatosis. Surrounding inflammatory change and free fluid in the abdomen and pelvis. Findings are concerning for possible bowel ischemia. Small bilateral pleural effusions, right larger than left, increased since prior study. Right lower lobe atelectasis. Cardiomegaly. Atrophic kidneys. Calcified uterine fibroids. Electronically Signed   By: Charlett Nose M.D.   On: 08/24/2015 13:31    Anti-infectives: Anti-infectives    Start     Dose/Rate Route Frequency Ordered Stop   08/23/15 1800  vancomycin (VANCOCIN) IVPB 750 mg/150 ml premix  Status:  Discontinued     750 mg 150 mL/hr over 60 Minutes Intravenous Every 24 hours 08/22/15 1419 08/22/15 1422   08/23/15 1300  anidulafungin (ERAXIS) 100 mg in sodium chloride 0.9 % 100 mL IVPB  Status:  Discontinued     100 mg over 90 Minutes Intravenous Every 24 hours 08/22/15 1236 08/24/15 1001   08/23/15 0800  vancomycin (VANCOCIN) IVPB 750 mg/150 ml premix  Status:  Discontinued     750 mg 150 mL/hr over 60 Minutes Intravenous Every 24 hours 08/22/15 1422 08/24/15 1331   08/22/15 1600  piperacillin-tazobactam (ZOSYN) IVPB 2.25 g  Status:  Discontinued     2.25 g 100 mL/hr over 30 Minutes Intravenous  Every 6 hours 08/22/15 1419 08/25/15 0955   08/22/15 1300  anidulafungin (ERAXIS) 200 mg in sodium chloride 0.9 % 200 mL IVPB     200 mg over 180 Minutes Intravenous  Once 08/22/15 1234 08/22/15 2029   08/22/15 1215  anidulafungin (ERAXIS) 200 mg in sodium chloride 0.9 % 200 mL IVPB  Status:  Discontinued     200 mg over 180 Minutes Intravenous Every 24 hours  08/22/15 1208 08/22/15 1234   08/22/15 1200  vancomycin (VANCOCIN) IVPB 750 mg/150 ml premix  Status:  Discontinued     750 mg 150 mL/hr over 60 Minutes Intravenous Every T-Th-Sa (Hemodialysis) 08/21/15 0602 08/22/15 1407   08/21/15 0615  vancomycin (VANCOCIN) 1,500 mg in sodium chloride 0.9 % 500 mL IVPB     1,500 mg 250 mL/hr over 120 Minutes Intravenous  Once 08/21/15 0602 08/21/15 0844   08/19/15 0600  piperacillin-tazobactam (ZOSYN) IVPB 2.25 g  Status:  Discontinued    Comments:  Zosyn 2.25 g IV q8h in ESRD   2.25 g 100 mL/hr over 30 Minutes Intravenous 3 times per day 08/19/15 0539 08/22/15 1407      Assessment/Plan: Abdominal pain/pneumatosis-CT revealed pneumatosis, colonic thickening. WBC trending down, afebrile, benign abdominal exam. No indications for surgical intervention. Advance to soft diet and follow clinically. I do not think clinically she warrants any surgery although ct is not normal. ID-vanc D#6 zosyn D#8 CV-AF, mod-sev MVR, hypotension, cards following Renal-CVVHD  Endo-DM, AI on stress steroids, hypothyroid   [facility] HospitalWAKEFIELD,Megan Lave 08/26/2015

## 2015-08-26 NOTE — Progress Notes (Signed)
PULMONARY / CRITICAL CARE MEDICINE   Name: Fredirick MaudlinMarjorie Cronin MRN: [id number] DOB: 09/06/1941    ADMISSION DATE:  09/06/2015 CONSULTATION DATE:  10/8  REFERRING MD :  Elvera LennoxGherghe   CHIEF COMPLAINT:  Hypotension   INITIAL PRESENTATION:  74 year old female w/ ESRD, DM, and CAF admitted 10/4 w/ infectious vs ischemic colitis. Treated conservatively w/ IVFs and abx. MS and BP worse on 10/8 w/ hypotension noted during HD so PCCM asked to assess.   STUDIES:  10/4 CT abd: 1. Apparent pneumatosis along the cecum and ascending colon at the right lower quadrant, of uncertain significance, with mild associated wall thickening and soft tissue inflammation.  ECHO 10/5: moderate LVH. Systolic function wasnormal. The estimated ejection fraction was in the range of 60%to 65%.  - Left atrium: Severely dilated. - Right ventricle: The cavity size was mildly dilated. - Right atrium: Severely dilated. - Tricuspid valve: There was moderate regurgitation. - Pulmonary arteries: PA peak pressure: 48 mm Hg (S). 10/10 CT abd: cecum and ascending colon with wall thickening and pneumatosis  SIGNIFICANT EVENTS: 10/8 -  cvvhd started  SUBJECTIVE:  Awake this morning. Tolerated grist and ice cream this morning. No abdominal pain.   VITAL SIGNS: Temp:  [97.3 F (36.3 C)-97.6 F (36.4 C)] 97.3 F (36.3 C) (10/12 0340) Pulse Rate:  [27-102] 27 (10/12 0500) Resp:  [12-44] 26 (10/12 0500) BP: (87-127)/(42-78) 100/54 mmHg (10/12 0500) SpO2:  [84 %-100 %] 100 % (10/12 0500) Weight:  [147 lb 11.3 oz (67 kg)] 147 lb 11.3 oz (67 kg) (10/12 0500) HEMODYNAMICS:   VENTILATOR SETTINGS:   INTAKE / OUTPUT:  Intake/Output Summary (Last 24 hours) at 08/26/15 0709 Last data filed at 08/26/15 0530  Gross per 24 hour  Intake   1238 ml  Output   2999 ml  Net  -1761 ml    PHYSICAL EXAMINATION: General:  Chronically ill appearing 74yo female in NAD Neuro:  Awake, oriented x3.  HEENT:  NCAT, poor dentition. No JVD,  catheter in rt ij WNL  Cardiovascular:  s1 s2 irregular Lungs: Diminished breath sounds bilaterally Abdomen:  +BS, S, NT, ND Musculoskeletal:  Intact  Skin:  Dry and intact   LABS:  CBC  Recent Labs Lab 08/24/15 0510 08/25/15 0436 08/26/15 0433  WBC 17.8* 16.4* 13.7*  HGB 9.3* 8.5* 8.4*  HCT 30.2* 26.6* 26.9*  PLT 111* 115* 121*   Coag's  Recent Labs Lab 08/24/15 0510 08/25/15 0436 08/26/15 0433  APTT 77* 68* 63*  INR 2.71* 2.82* 2.19*   BMET  Recent Labs Lab 08/25/15 0436 08/25/15 1515 08/26/15 0433  NA 132* 132* 134*  K 3.9 4.1 3.9  CL 98* 98* 99*  CO2 27 26 27   BUN 8 10 8   CREATININE 1.38* 1.33* 1.23*  GLUCOSE 158* 311* 122*   Electrolytes  Recent Labs Lab 08/24/15 0510  08/25/15 0436 08/25/15 1515 08/26/15 0433  CALCIUM 7.6*  < > 7.2* 7.2* 7.6*  MG 2.2  --  2.2  --  2.3  PHOS 1.8*  < > 1.9* 3.6 2.0*  < > = values in this interval not displayed. Sepsis Markers  Recent Labs Lab 08/20/15 0510 08/21/15 2328 08/22/15 0148 08/22/15 2101  LATICACIDVEN 1.8 1.3 1.4 1.1  PROCALCITON 35.22  --  22.42  --    ABG  Recent Labs Lab 08/21/15 2242  PHART 7.509*  PCO2ART 36.8  PO2ART 76.7*   Liver Enzymes  Recent Labs Lab 08/20/15 0510 08/22/15 0148  08/25/15 0436 08/25/15 1515  08/26/15 0433  AST 64* 33  --   --   --   --   ALT 26 22  --   --   --   --   ALKPHOS 73 79  --   --   --   --   BILITOT 1.0 0.9  --   --   --   --   ALBUMIN 1.6* 1.4*  < > 1.4* 1.4* 1.5*  < > = values in this interval not displayed. Cardiac Enzymes No results for input(s): TROPONINI, PROBNP in the last 168 hours. Glucose  Recent Labs Lab 08/24/15 0745 08/24/15 1949 08/24/15 2356 08/25/15 0327 08/25/15 1117 08/25/15 1943  GLUCAP 215* 171* 253* 162* 237* 239*    Imaging No results found.  PCXR w/ progressive R>L edema   ASSESSMENT / PLAN:  PULMONARY A: Atelectasis  Acute pulm edema BL pleural effusions P:   Pulse ox to sats goal  92%  CARDIOVASCULAR CVL RIJ  A:  AF w/ RVR CHADVASc = 6  Intermittent hypotension a/w HD and volume removal. BP improved H/o PAD Progressive pulmonary edema and effusions Mod to severe MR Mild PAH P:  If needed add neo for map goal 55-60 Continue midodrine  RENAL A:   ESRD Baseline dry weight is 65.5kgs. P:   CRRT Transition to intermittent HD per nephrology Tolerates neg balance  GASTROINTESTINAL A:   Bowel pneumatosis - likely ischemic Protein calorie malnutrition  P:   No indication for surgery Advance diet per surgery  HEMATOLOGIC A:   No acute bleeding Mild thrombocytopenia   Coumadin induced coagulopathy (fib, cva) s/p 2U FFP P:  Trend cbc INR daily Consider restarting coumadin vs heparin drip once INR less 2.0 (may need further procedures)  INFECTIOUS A:   Infectious vs ischemic Colitis  P:   vanc 10/6>>>10/10 Zosyn 10/4>>>10/11 eraxis 10/8>>>10/11 BCX2 10/7>>> ng  ENDOCRINE A:   DM Hypothyroidism  P:   ssi Lantus 10U nightly Cont synthroid  NEUROLOGIC A:  Mild acute encephalopathy - improving P:   Avoid benzo PT eval and treat  FAMILY  - Updates: to pt  - Inter-disciplinary family meet or Palliative Care meeting due by:  10/15  Caleb M. Jimmey Ralph, MD [facility] Resident PGY-2 08/26/2015 7:09 AM

## 2015-08-26 NOTE — Procedures (Signed)
Admit: 08/20/2015 LOS: 8  32F ESRD [facility] via AVF w/ pulmonary edema/hypervolemia, colonic pneumatosis, permAFib, known ASCVD.  Current CRRT Prescription: Start Date: 08/22/15 [facility]15 nontunneled BFR: 200 4K Pre Blood Pump: 400 4K DFR: 1800 4K Replacement Rate: 200 Goal UF: 1875mL/hr net negative Anticoagulation: systemic anticoagulation Clotting: none since beginning   S: No new events stable on CRRT Eating, abd remains benign  O: 10/11 0701 - 10/12 0700 In: 1358 [P.O.:810; I.V.:240; IV Piggyback:308] Out: 3171   Filed Weights   08/24/15 0500 08/25/15 0500 08/26/15 0500  Weight: 69.1 kg (152 lb 5.4 oz) 69.8 kg (153 lb 14.1 oz) 67 kg (147 lb 11.3 oz)     Recent Labs Lab 08/25/15 0436 08/25/15 1515 08/26/15 0433  NA 132* 132* 134*  K 3.9 4.1 3.9  CL 98* 98* 99*  CO2 27 26 27   GLUCOSE 158* 311* 122*  BUN 8 10 8   CREATININE 1.38* 1.33* 1.23*  CALCIUM 7.2* 7.2* 7.6*  PHOS 1.9* 3.6 2.0*    Recent Labs Lab 08/24/15 0510 08/25/15 0436 08/26/15 0433  WBC 17.8* 16.4* 13.7*  NEUTROABS 16.4*  --   --   HGB 9.3* 8.5* 8.4*  HCT 30.2* 26.6* 26.9*  MCV 93.5 93.3 94.7  PLT 111* 115* 121*    Scheduled Meds: . darbepoetin (ARANESP) injection - DIALYSIS  150 mcg Intravenous Q Tue-HD  . doxercalciferol  1 mcg Intravenous Q T,Th,Sa-HD  . feeding supplement  1 Container Oral TID BM  . insulin aspart  0-15 Units Subcutaneous 6 times per day  . insulin glargine  10 Units Subcutaneous QHS  . levothyroxine  250 mcg Oral QAC breakfast  . midodrine  10 mg Oral TID WC  . multivitamin  1 tablet Oral QHS  . pneumococcal 23 valent vaccine  0.5 mL Intramuscular Tomorrow-1000  . pravastatin  40 mg Oral q1800  . sodium chloride  3 mL Intravenous Q12H   Continuous Infusions: . dialysis replacement fluid (prismasate) 400 mL/hr at 08/26/15 0259  . dialysis replacement fluid (prismasate) 200 mL/hr at 08/26/15 0250  . dialysate (PRISMASATE) 1,800 mL/hr at  08/26/15 0931   PRN Meds:.sodium chloride, alteplase, heparin, metoprolol, ondansetron **OR** ondansetron (ZOFRAN) IV, sodium chloride, sodium chloride  ABG    Component Value Date/Time   PHART 7.509* 08/21/2015 2242   PCO2ART 36.8 08/21/2015 2242   PO2ART 76.7* 08/21/2015 2242   HCO3 29.1* 08/21/2015 2242   TCO2 30.2 08/21/2015 2242   O2SAT 92.9 08/21/2015 2242    Outpt Dialysis Orders: TTS NW 4 hrs 65.5kgs 2K/2Ca L AVF 1000u hep hectorol 2 venofor 100 q HD until 10/8 then 50 q week Micera 100 q2 weeks - to start 10/6  A/P  1. 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 Stable, not on pressors, will stop CRRT today 2. Tentative iHD tomorrow or 10/14 3. Outpt LUE AVF, using TDC R IJ now -- will remove TDC after firs successful HD via AVF 4. K ok, low Phos but will increase w/ dc of CRRT 2.  Hypotension during HD 10/8 1. Low/stable off pressors 2. Cont with mild RVR  3. On stress dose steroids for relative AI 4. On midodrine 3. ?colonic ischemia -- CCS following, on ABX, benign abd; on vanc/zosyn/anidulafungin; advancing diet andf WBC downtrending 4. Permanent AFib: Cardiology has followed 5. ASCVD/hx/o CABG 6. Anemia, stable, monitor 1. ESA 150mc ASranesp qTues  Sabra Heckyan Briget Shaheed, MD [facility] pgr [phone]

## 2015-08-26 NOTE — Progress Notes (Signed)
Hypoglycemic Event  CBG: 63   Treatment: 15 GM carbohydrate snack - 8oz of orange juice given   Symptoms: None  Follow-up CBG: Time: 0815 CBG Result: 80 - 8oz additional orange juice given until breakfast  Possible Reasons for Event: Inadequate meal intake and Change in activity  Comments/MD notified: Dr. Vassie LollAlva present on unit at time of event and aware.     [place], Schering-PloughCrystal E

## 2015-08-26 NOTE — Evaluation (Signed)
Physical Therapy Evaluation Patient Details Name: Megan Vasquez MRN: [id number] DOB: 02/25/1941 Today's Date: 08/26/2015   History of Present Illness  74 year old female with ESRD, chronic atrial fibrillation, chronic kidney disease, diabetes, htn, CVA, severe MR, CABG, presents to the ER because of poor appetite. 1 day h/o generalized weakness, lethargy and inability to communicate with the family.  Uupon presentation patient was found to be in atrial fibrillation with rapid ventricular response. Pt also related abdominal pain determined to be colitis of an infectious vs ischemic nature. Placed on CRRT for pulmonary edema/hypervolermia. CRRT stopped 10/12.  Clinical Impression  Pt admitted with/for AMS, colitis and weakness.  Pt currently limited functionally due to the problems listed. ( See problems list.)   Pt will benefit from PT to maximize function and safety in order to get ready for next venue listed below.     Follow Up Recommendations SNF    Equipment Recommendations  Other (comment);None recommended by PT (TBA)    Recommendations for Other Services       Precautions / Restrictions Precautions Precautions: Fall      Mobility  Bed Mobility Overal bed mobility: Needs Assistance;+2 for physical assistance Bed Mobility: Supine to Sit;Sit to Supine     Supine to sit: +2 for physical assistance;Max assist Sit to supine: Total assist;+2 for physical assistance   General bed mobility comments: pt attempted to assist, but can't move through sequences of task without physical assist  Transfers Overall transfer level: Needs assistance Equipment used: 2 person hand held assist Transfers: Sit to/from Stand Sit to Stand: Total assist;+2 physical assistance         General transfer comment: Little initiation. Significantly weak knees so needed significant assist to help her foward and to power up.  Ambulation/Gait             General Gait Details: not  able  Stairs            Wheelchair Mobility    Modified Rankin (Stroke Patients Only)       Balance Overall balance assessment: Needs assistance Sitting-balance support: Feet supported;Single extremity supported Sitting balance-Leahy Scale: Poor Sitting balance - Comments: relied on L UE to assist     Standing balance-Leahy Scale: Zero                               Pertinent Vitals/Pain Pain Assessment: Faces Faces Pain Scale: Hurts little more Pain Location: vague Pain Descriptors / Indicators: Grimacing Pain Intervention(s): Monitored during session    Home Living Family/patient expects to be discharged to:: Unsure Living Arrangements: Other (Comment) (grandson) Available Help at Discharge: Family;Other (Comment) (grandson doesn't work, but pt unable to relate more) Type of Home: House         Home Equipment: Environmental consultantWalker - 2 wheels Additional Comments: pt unable to relate her PLOF    Prior Function Level of Independence:  (Unsure of her PLOF)               Hand Dominance        Extremity/Trunk Assessment   Upper Extremity Assessment: Defer to OT evaluation           Lower Extremity Assessment: Difficult to assess due to impaired cognition;Generalized weakness         Communication   Communication: Other (comment) (soft spoken)  Cognition Arousal/Alertness: Lethargic;Awake/alert Behavior During Therapy: Flat affect Overall Cognitive Status: Impaired/Different from baseline Area of Impairment: Attention;Following commands;Safety/judgement;Awareness;Problem  solving   Current Attention Level: Sustained Memory: Decreased short-term memory Following Commands: Follows one step commands with increased time;Follows one step commands inconsistently Safety/Judgement: Decreased awareness of safety;Decreased awareness of deficits Awareness: Intellectual Problem Solving: Slow processing (perseveration ) General Comments: perseverates on  ideas    General Comments General comments (skin integrity, edema, etc.): HR increasing from 100's through 110's max of 134 bpm during mobility.    Exercises        Assessment/Plan    PT Assessment Patient needs continued PT services  PT Diagnosis Difficulty walking;Generalized weakness;Altered mental status   PT Problem List Decreased strength;Decreased activity tolerance;Decreased balance;Decreased mobility;Decreased cognition;Decreased knowledge of use of DME  PT Treatment Interventions Gait training;Functional mobility training;Therapeutic activities;Therapeutic exercise;Balance training;Patient/family education;Neuromuscular re-education   PT Goals (Current goals can be found in the Care Plan section) Acute Rehab PT Goals Patient Stated Goal: pt unable to participate with goals PT Goal Formulation: Patient unable to participate in goal setting Time For Goal Achievement: 10-08-2015 Potential to Achieve Goals: Good    Frequency Min 3X/week   Barriers to discharge        Co-evaluation               End of Session Equipment Utilized During Treatment: Oxygen Activity Tolerance: Patient limited by fatigue;Patient limited by lethargy;Other (comment) (increasing HR) Patient left: in bed           Time: 1540-1606 PT Time Calculation (min) (ACUTE ONLY): 26 min   Charges:   PT Evaluation $Initial PT Evaluation Tier I: 1 Procedure PT Treatments $Therapeutic Activity: 8-22 mins   PT G Codes:        [place] Woolbright, Eliseo Gum 08/26/2015, 4:32 PM 08/26/2015  [place] Bing, PT [phone]  (pager)

## 2015-08-26 NOTE — Care Management Note (Signed)
Case Management Note  Patient Details  Name: Megan MaudlinMarjorie Vasquez MRN: [id number] Date of Birth: 07/04/1941  Subjective/Objective:    Lives at home with grandson who is able to assit her.  States she gets out of be by self.  Has a rolling walker.  Patient is lying in bed, very hard to hear her quiet voice, little facial expression.  Agrees with being weak and agreeable to needing rehab prior to going home.  PT consult in.                 Action/Plan:   Expected Discharge Date:                  Expected Discharge Plan:  Home/Self Care  In-House Referral:     Discharge planning Services  CM Consult  Post Acute Care Choice:    Choice offered to:     DME Arranged:    DME Agency:     HH Arranged:    HH Agency:     Status of Service:  In process, will continue to follow  Medicare Important Message Given:  Yes-second notification given Date Medicare IM Given:    Medicare IM give by:    Date Additional Medicare IM Given:    Additional Medicare Important Message give by:     If discussed at Long Length of Stay Meetings, dates discussed:    Additional Comments:  Vangie BickerBrown, Sunny Gains Jane, RN 08/26/2015, 1:32 PM

## 2015-08-27 LAB — CBC
HCT: 27.8 % — ABNORMAL LOW (ref 36.0–46.0)
HEMOGLOBIN: 8.6 g/dL — AB (ref 12.0–15.0)
MCH: 29.1 pg (ref 26.0–34.0)
MCHC: 30.9 g/dL (ref 30.0–36.0)
MCV: 93.9 fL (ref 78.0–100.0)
PLATELETS: 144 10*3/uL — AB (ref 150–400)
RBC: 2.96 MIL/uL — ABNORMAL LOW (ref 3.87–5.11)
RDW: 16.2 % — AB (ref 11.5–15.5)
WBC: 17 10*3/uL — ABNORMAL HIGH (ref 4.0–10.5)

## 2015-08-27 LAB — BASIC METABOLIC PANEL
ANION GAP: 7 (ref 5–15)
BUN: 11 mg/dL (ref 6–20)
CALCIUM: 7.5 mg/dL — AB (ref 8.9–10.3)
CO2: 26 mmol/L (ref 22–32)
Chloride: 98 mmol/L — ABNORMAL LOW (ref 101–111)
Creatinine, Ser: 2.73 mg/dL — ABNORMAL HIGH (ref 0.44–1.00)
GFR calc Af Amer: 18 mL/min — ABNORMAL LOW (ref >60)
GFR, EST NON AFRICAN AMERICAN: 16 mL/min — AB (ref >60)
GLUCOSE: 140 mg/dL — AB (ref 65–99)
Potassium: 4.4 mmol/L (ref 3.5–5.1)
Sodium: 131 mmol/L — ABNORMAL LOW (ref 135–145)

## 2015-08-27 LAB — PROTIME-INR
INR: 1.91 — AB (ref 0.00–1.49)
PROTHROMBIN TIME: 21.8 s — AB (ref 11.6–15.2)

## 2015-08-27 LAB — GLUCOSE, CAPILLARY
GLUCOSE-CAPILLARY: 100 mg/dL — AB (ref 65–99)
GLUCOSE-CAPILLARY: 137 mg/dL — AB (ref 65–99)
GLUCOSE-CAPILLARY: 197 mg/dL — AB (ref 65–99)
GLUCOSE-CAPILLARY: 244 mg/dL — AB (ref 65–99)
GLUCOSE-CAPILLARY: 294 mg/dL — AB (ref 65–99)
GLUCOSE-CAPILLARY: 317 mg/dL — AB (ref 65–99)
Glucose-Capillary: 318 mg/dL — ABNORMAL HIGH (ref 65–99)

## 2015-08-27 MED ORDER — CARVEDILOL PHOSPHATE ER 10 MG PO CP24
10.0000 mg | ORAL_CAPSULE | Freq: Every day | ORAL | Status: DC
  Administered 2015-08-27 – 2015-09-05 (×8): 10 mg via ORAL
  Filled 2015-08-27 (×11): qty 1

## 2015-08-27 MED ORDER — HEPARIN (PORCINE) IN NACL 100-0.45 UNIT/ML-% IJ SOLN
1100.0000 [IU]/h | INTRAMUSCULAR | Status: DC
  Administered 2015-08-27 – 2015-08-28 (×2): 1100 [IU]/h via INTRAVENOUS
  Filled 2015-08-27 (×2): qty 250

## 2015-08-27 MED ORDER — METOPROLOL SUCCINATE ER 25 MG PO TB24: 12.5000 mg | ORAL_TABLET | Freq: Every day | ORAL | Status: DC

## 2015-08-27 MED ORDER — SODIUM CHLORIDE 0.9 % IJ SOLN
10.0000 mL | INTRAMUSCULAR | Status: DC | PRN
  Administered 2015-08-27: 40 mL
  Administered 2015-08-29 – 2015-09-05 (×2): 10 mL
  Filled 2015-08-27 (×3): qty 40

## 2015-08-27 MED ORDER — WARFARIN SODIUM 2.5 MG PO TABS: 4.5000 mg | ORAL_TABLET | Freq: Once | ORAL | Status: DC

## 2015-08-27 MED ORDER — INSULIN ASPART 100 UNIT/ML ~~LOC~~ SOLN
0.0000 [IU] | Freq: Three times a day (TID) | SUBCUTANEOUS | Status: DC
  Administered 2015-08-27: 5 [IU] via SUBCUTANEOUS
  Administered 2015-08-27: 11 [IU] via SUBCUTANEOUS
  Administered 2015-08-28 (×2): 3 [IU] via SUBCUTANEOUS
  Administered 2015-08-29 (×2): 8 [IU] via SUBCUTANEOUS

## 2015-08-27 NOTE — Progress Notes (Signed)
Patient's daughter states that last time she was here on Boost it caused her blood sugars to rise significantly. Patient's daughter requests to not have Boost anymore.

## 2015-08-27 NOTE — Progress Notes (Signed)
ANTICOAGULATION CONSULT NOTE - Initial Consult  Pharmacy Consult for Heparin Indication: atrial fibrillation  No Known Allergies  Patient Measurements: Height: 5\' 7"  (170.2 cm) Weight: 161 lb 3.2 oz (73.12 kg) IBW/kg (Calculated) : 61.6 Heparin Dosing Weight:   Vital Signs: Temp: 98.6 F (37 C) (10/13 1123) Temp Source: Oral (10/13 1123) BP: 117/63 mmHg (10/13 0433) Pulse Rate: 99 (10/13 0433)  Labs:  Recent Labs  08/25/15 0436  08/26/15 0433 08/26/15 2024 08/27/15 0515  HGB 8.5*  --  8.4*  --  8.6*  HCT 26.6*  --  26.9*  --  27.8*  PLT 115*  --  121*  --  144*  APTT 68*  --  63*  --   --   LABPROT 29.2*  --  24.2*  --  21.8*  INR 2.82*  --  2.19*  --  1.91*  CREATININE 1.38*  < > 1.23* 2.06* 2.73*  < > = values in this interval not displayed.  Estimated Creatinine Clearance: 17 mL/min (by C-G formula based on Cr of 2.73).   Medical History: Past Medical History  Diagnosis Date  . Diabetes mellitus without complication (HCC)   . Hypertension   . Hyperlipidemia   . Atrial fibrillation (HCC)   . ESRD (end stage renal disease) (HCC)     HD since 06/2000  . CAD (coronary artery disease)   . 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 History of nuclear stress test 10/2012    lexiscan; normal stress test; post-stress EF72%  . Peripheral arterial disease (HCC)   . Osteoporosis   . Mitral regurgitation     severe by 2-D echocardiogram performed 10/22/14    Medications:  Scheduled:  . carvedilol  10 mg Oral Daily  . darbepoetin (ARANESP) injection - DIALYSIS  150 mcg Intravenous Q Tue-HD  . doxercalciferol  1 mcg Intravenous Q T,Th,Sa-HD  . feeding supplement  1 Container Oral TID BM  . insulin aspart  0-15 Units Subcutaneous TID WC & HS  . insulin glargine  10 Units Subcutaneous QHS  . levothyroxine  250 mcg Oral QAC breakfast  .  midodrine  10 mg Oral TID WC  . multivitamin  1 tablet Oral QHS  . pneumococcal 23 valent vaccine  0.5 mL Intramuscular Tomorrow-1000  . pravastatin  40 mg Oral q1800  . sodium chloride  3 mL Intravenous Q12H  . warfarin  4.5 mg Oral ONCE-1800  . Warfarin - Pharmacist Dosing Inpatient   Does not apply q1800    Assessment: 74yo female ESRD-HD TTS with AFib, on Coumadin with INR that is just below goal at 1.91.   Pt with possible ischemic bowel and with need for surgery, as well as some mental status changes through the day- to add Heparin.  Pt is to receive Coumadin 4.5mg  today; discussed with Dr.Samtani & to stop Coumadin.  Goal of Therapy:  Heparin level 0.3-0.7 units/ml Monitor platelets by anticoagulation protocol: Yes   Plan:  D/C Coumadin and daily INR Heparin 1100 units/hr, no bolus Heparin level and CBC 8hr after initiation, then daily Watch for s/s of bleeding. F/U plans for OR vs resume Coumadin  Marisue HumbleKendra Rusty Glodowski, PharmD Clinical Pharmacist [facility] [place]

## 2015-08-27 NOTE — Progress Notes (Signed)
Megan Vasquez GBT:[id number] DOB: 06/05/1941 DOA: 09/01/2015 PCP: Maximino Greenland, MD  Brief narrative: 74 y/o ? ESRD dialsyes m[facility]w/f [facility] Afib on coumadin Ty ii DM Htn   Admitted c Afib /RVR and cardiology consulted CT scan date of admission=?Nec entercolitis and seen by Gen surgery subsequently  Due to transient MS changes on 10/8 and hypotension CCM evaluated the patient and subsequently had dialysis catheter placd and needed CVVH and was transitioned back to HD  Past medical history-As per Problem list Chart reviewed as below-   Consultants:  Nephroi  Cardiology s/o 10/11  Procedures:    Antibiotics:  Vanc + Zosyn 10/4-->10/11   Subjective   Confused.   Was a little mor ewith it ealrie rtoday No c/o  Barely ate lunch plate No other concerns Cannot tell me date time year but was able to do ths earlier   Objective    Interim History:   Telemetry: Afib with Hr's 130's   Objective: Filed Vitals:   08/27/15 0211 08/27/15 0433 08/27/15 0742 08/27/15 1123  BP: 127/62 117/63    Pulse:  99    Temp:  98.2 F (36.8 C) 98.3 F (36.8 C) 98.6 F (37 C)  TempSrc:  Oral Oral Oral  Resp:  28    Height:      Weight:  73.12 kg (161 lb 3.2 oz)    SpO2:  92%      Intake/Output Summary (Last 24 hours) at 08/27/15 1312 Last data filed at 08/27/15 0900  Gross per 24 hour  Intake    490 ml  Output      0 ml  Net    490 ml    Exam:  General: eomi ncat Cardiovascular: s1 s 2 tachy Respiratory:  clea rno added soudn Abdomen:  Soft nt slight distension, no rebound Skin no le edema Neuro intact  Data Reviewed: Basic Metabolic Panel:  Recent Labs Lab 08/23/15 0506  08/24/15 0510 08/24/15 1520 08/25/15 0436 08/25/15 1515 08/26/15 0433 08/26/15 2024 08/27/15 0515  NA 135  < > 134* 132* 132* 132* 134* 132* 131*  K 3.8  < > 4.3 4.1 3.9 4.1 3.9 4.7 4.4  CL 96*  < > 100* 98* 98* 98* 99* 99* 98*  CO2 28  < > _0 GLUCOSE  181*  < > 228* 233* 158* 311* 122* 259* 140*  BUN 10  < > _1 CREATININE 2.05*  < > 1.53* 1.49* 1.38* 1.33* 1.23* 2.06* 2.73*  CALCIUM 7.3*  < > 7.6* 7.2* 7.2* 7.2* 7.6* 7.9* 7.5*  MG 1.9  --  2.2  --  2.2  --  2.3  --   --   PHOS 2.2*  < > 1.8* 3.5 1.9* 3.6 2.0* 1.6*  --   < > = values in this interval not displayed. Liver Function Tests:  Recent Labs Lab 08/22/15 0148  08/24/15 1520 08/25/15 0436 08/25/15 1515 08/26/15 0433 08/26/15 2024  AST 33  --   --   --   --   --   --   ALT 22  --   --   --   --   --   --   ALKPHOS 79  --   --   --   --   --   --   BILITOT 0.9  --   --   --   --   --   --  PROT 5.4*  --   --   --   --   --   --   ALBUMIN 1.4*  < > 1.4* 1.4* 1.4* 1.5* 1.5*  < > = values in this interval not displayed. No results for input(s): LIPASE, AMYLASE in the last 168 hours. No results for input(s): AMMONIA in the last 168 hours. CBC:  Recent Labs Lab 08/23/15 1217 08/24/15 0510 08/25/15 0436 08/26/15 0433 08/27/15 0515  WBC 15.9* 17.8* 16.4* 13.7* 17.0*  NEUTROABS  --  16.4*  --   --   --   HGB 9.2* 9.3* 8.5* 8.4* 8.6*  HCT 29.1* 30.2* 26.6* 26.9* 27.8*  MCV 94.5 93.5 93.3 94.7 93.9  PLT 95* 111* 115* 121* 144*   Cardiac Enzymes: No results for input(s): CKTOTAL, CKMB, CKMBINDEX, TROPONINI in the last 168 hours. BNP: Invalid input(s): POCBNP CBG:  Recent Labs Lab 08/26/15 2123 08/27/15 0024 08/27/15 0522 08/27/15 0745 08/27/15 1123  GLUCAP 296* 294* 137* 100* 197*    Recent Results (from the past 240 hour(s))  MRSA PCR Screening     Status: None   Collection Time: 08/23/2015  9:05 AM  Result Value Ref Range Status   MRSA by PCR NEGATIVE NEGATIVE Final    Comment:        The GeneXpert MRSA Assay (FDA approved for NASAL specimens only), is one component of a comprehensive MRSA colonization surveillance program. It is not intended to diagnose MRSA infection nor to guide or monitor treatment for MRSA infections.   Urine  culture     Status: None   Collection Time: 09/05/2015 12:27 PM  Result Value Ref Range Status   Specimen Description URINE, CLEAN CATCH  Final   Special Requests NONE  Final   Culture MULTIPLE SPECIES PRESENT, SUGGEST RECOLLECTION  Final   Report Status 08/19/2015 FINAL  Final  Culture, blood (routine x 2)     Status: None   Collection Time: 09/13/2015  8:20 PM  Result Value Ref Range Status   Specimen Description BLOOD RIGHT WRIST  Final   Special Requests IN PEDIATRIC BOTTLE 3CC  Final   Culture NO GROWTH 5 DAYS  Final   Report Status 08/23/2015 FINAL  Final  Culture, blood (routine x 2)     Status: None   Collection Time: 08/25/2015  8:30 PM  Result Value Ref Range Status   Specimen Description BLOOD RIGHT HAND  Final   Special Requests IN PEDIATRIC BOTTLE 3CC  Final   Culture NO GROWTH 5 DAYS  Final   Report Status 08/23/2015 FINAL  Final  Culture, blood (routine x 2)     Status: None   Collection Time: 08/21/15  7:50 AM  Result Value Ref Range Status   Specimen Description BLOOD HAND RIGHT  Final   Special Requests BOTTLES DRAWN AEROBIC AND ANAEROBIC 5CC  Final   Culture NO GROWTH 5 DAYS  Final   Report Status 08/26/2015 FINAL  Final  Culture, blood (routine x 2)     Status: None   Collection Time: 08/21/15  8:00 AM  Result Value Ref Range Status   Specimen Description BLOOD HAND RIGHT  Final   Special Requests BOTTLES DRAWN AEROBIC ONLY 10CC  Final   Culture NO GROWTH 5 DAYS  Final   Report Status 08/26/2015 FINAL  Final  MRSA PCR Screening     Status: None   Collection Time: 08/22/15  3:26 PM  Result Value Ref Range Status   MRSA by PCR NEGATIVE NEGATIVE  Final    Comment:        The GeneXpert MRSA Assay (FDA approved for NASAL specimens only), is one component of a comprehensive MRSA colonization surveillance program. It is not intended to diagnose MRSA infection nor to guide or monitor treatment for MRSA infections.      Studies:              All Imaging  reviewed and is as per above notation   Scheduled Meds: . darbepoetin (ARANESP) injection - DIALYSIS  150 mcg Intravenous Q Tue-HD  . doxercalciferol  1 mcg Intravenous Q T,Th,Sa-HD  . feeding supplement  1 Container Oral TID BM  . insulin aspart  0-15 Units Subcutaneous 6 times per day  . insulin glargine  10 Units Subcutaneous QHS  . levothyroxine  250 mcg Oral QAC breakfast  . midodrine  10 mg Oral TID WC  . multivitamin  1 tablet Oral QHS  . pneumococcal 23 valent vaccine  0.5 mL Intramuscular Tomorrow-1000  . pravastatin  40 mg Oral q1800  . sodium chloride  3 mL Intravenous Q12H  . Warfarin - Pharmacist Dosing Inpatient   Does not apply q1800   Continuous Infusions:    Assessment/Plan:  1. Nec enterocolitis-appreciate surg input.  Currently on no abx. Low grade temp overnight Diet graduation and other recs dependant on them.  Poor overall surgical candidate.  would not re-image at present 2. ESRD TTS-Appe Nephrology.  dialysing 10/14.  Continue Midrodrine 10 tid for BP support. TDC for removal.  Dry wght 65kg       Afib + RVR-Cards s/o 10/11-continue prn metoprolol-added Coreg 10 XL in am.  Give 1/2 this dose on dialysis days?  if difficulty controlling rate would ask cardiology for a re-look.  Coumadin held 2/2 ? risk GIB-INR 1.9. Start heparin [might need suregry] 3. DM tyii-change to QID coverage, continue lantus 10.  CBG's are 100-200.  improved  4. Hypothyroidism LEvothyroxine 250 mcg.  LArge dose.  Close recheck TFT's as op 5. Mod-severe MAR/PAH-medical management-not candidate operative 6. AOCD/Renal disease-ARanesp q tues. 7. 2/2 hypoparathyroidism-cont doxercalcif 1 mcg tts.  Per renal 8. hld-cont prvachol 40 daily 9. MEt encephalopathy-unclear baseline-will enquire with family.  Poor overall prognossis given multi-system disease and potential for worsening   Code Status: Full code Family Communication: family not + at bedside Disposition Plan: ?SNF DVT prophylaxis:  SCD Consultants: renal  Verneita Griffes, MD  [facility] Pager [phone] 08/27/2015, 1:12 PM    LOS: 9 days

## 2015-08-27 NOTE — Discharge Instructions (Signed)

## 2015-08-27 NOTE — Progress Notes (Signed)
Patient ID: Megan Vasquez, female   DOB: Jun 13, 1941, 74 y.o.   MRN: [id number]    Subjective: Pt seems a bit confused.  Denies abdominal pain.  No nausea.    Objective: Vital signs in last 24 hours: Temp:  [97 F (36.1 C)-100.9 F (38.3 C)] 98.2 F (36.8 C) (10/13 0433) Pulse Rate:  [99-112] 99 (10/13 0433) Resp:  [16-28] 28 (10/13 0433) BP: (98-151)/(53-80) 117/63 mmHg (10/13 0433) SpO2:  [92 %-100 %] 92 % (10/13 0433) Weight:  [73.12 kg (161 lb 3.2 oz)] 73.12 kg (161 lb 3.2 oz) (10/13 0433) Last BM Date: 08/25/15  Intake/Output from previous day: 10/12 0701 - 10/13 0700 In: 342 [P.O.:272; I.V.:70] Out: 370  Intake/Output this shift:    PE: Abd: soft, NT, ND, +BS  Lab Results:   Recent Labs  08/26/15 0433 08/27/15 0515  WBC 13.7* 17.0*  HGB 8.4* 8.6*  HCT 26.9* 27.8*  PLT 121* 144*   BMET  Recent Labs  08/26/15 2024 08/27/15 0515  NA 132* 131*  K 4.7 4.4  CL 99* 98*  CO2 26 26  GLUCOSE 259* 140*  BUN 9 11  CREATININE 2.06* 2.73*  CALCIUM 7.9* 7.5*   PT/INR  Recent Labs  08/26/15 0433 08/27/15 0515  LABPROT 24.2* 21.8*  INR 2.19* 1.91*   CMP     Component Value Date/Time   NA 131* 08/27/2015 0515   K 4.4 08/27/2015 0515   CL 98* 08/27/2015 0515   CO2 26 08/27/2015 0515   GLUCOSE 140* 08/27/2015 0515   BUN 11 08/27/2015 0515   CREATININE 2.73* 08/27/2015 0515   CALCIUM 7.5* 08/27/2015 0515   CALCIUM 8.4 02/24/2010 1106   PROT 5.4* 08/22/2015 0148   ALBUMIN 1.5* 08/26/2015 2024   AST 33 08/22/2015 0148   ALT 22 08/22/2015 0148   ALKPHOS 79 08/22/2015 0148   BILITOT 0.9 08/22/2015 0148   GFRNONAA 16* 08/27/2015 0515   GFRAA 18* 08/27/2015 0515   Lipase     Component Value Date/Time   LIPASE 17* 08/23/2015 2020       Studies/Results: No results found.  Anti-infectives: Anti-infectives    Start     Dose/Rate Route Frequency Ordered Stop   08/23/15 1800  vancomycin (VANCOCIN) IVPB 750 mg/150 ml premix  Status:   Discontinued     750 mg 150 mL/hr over 60 Minutes Intravenous Every 24 hours 08/22/15 1419 08/22/15 1422   08/23/15 1300  anidulafungin (ERAXIS) 100 mg in sodium chloride 0.9 % 100 mL IVPB  Status:  Discontinued     100 mg over 90 Minutes Intravenous Every 24 hours 08/22/15 1236 08/24/15 1001   08/23/15 0800  vancomycin (VANCOCIN) IVPB 750 mg/150 ml premix  Status:  Discontinued     750 mg 150 mL/hr over 60 Minutes Intravenous Every 24 hours 08/22/15 1422 08/24/15 1331   08/22/15 1600  piperacillin-tazobactam (ZOSYN) IVPB 2.25 g  Status:  Discontinued     2.25 g 100 mL/hr over 30 Minutes Intravenous Every 6 hours 08/22/15 1419 08/25/15 0955   08/22/15 1300  anidulafungin (ERAXIS) 200 mg in sodium chloride 0.9 % 200 mL IVPB     200 mg over 180 Minutes Intravenous  Once 08/22/15 1234 08/22/15 2029   08/22/15 1215  anidulafungin (ERAXIS) 200 mg in sodium chloride 0.9 % 200 mL IVPB  Status:  Discontinued     200 mg over 180 Minutes Intravenous Every 24 hours 08/22/15 1208 08/22/15 1234   08/22/15 1200  vancomycin (VANCOCIN) IVPB 750 mg/150  ml premix  Status:  Discontinued     750 mg 150 mL/hr over 60 Minutes Intravenous Every T-Th-Sa (Hemodialysis) 08/21/15 0602 08/22/15 1407   08/21/15 0615  vancomycin (VANCOCIN) 1,500 mg in sodium chloride 0.9 % 500 mL IVPB     1,500 mg 250 mL/hr over 120 Minutes Intravenous  Once 08/21/15 0602 08/21/15 0844   08/19/15 0600  piperacillin-tazobactam (ZOSYN) IVPB 2.25 g  Status:  Discontinued    Comments:  Zosyn 2.25 g IV q8h in ESRD   2.25 g 100 mL/hr over 30 Minutes Intravenous 3 times per day 08/19/15 0539 08/22/15 1407       Assessment/Plan  Abdominal pain/pneumatosis-CT revealed pneumatosis, colonic thickening. WBC trending back up some, but remains afebrile with a benign abdominal exam. No indications for surgical intervention. Cont soft diet and follow clinically. I do not think, clinically, she warrants any surgery although ct is not  normal. ID-vanc D#7 zosyn D#9 CV-AF, mod-sev MVR, hypotension, cards following Renal-off CRRT, creatinine bumped to 2.73 Endo-DM, AI on stress steroids, hypothyroid    LOS: 9 days    Megan Vasquez 08/27/2015, 7:13 AM Pager: [phone]

## 2015-08-27 NOTE — Progress Notes (Signed)
ANTICOAGULATION CONSULT NOTE - Follow Up Consult  Pharmacy Consult for Warfarin Indication: atrial fibrillation  No Known Allergies  Patient Measurements: Height: 5\' 7"  (170.2 cm) Weight: 161 lb 3.2 oz (73.12 kg) IBW/kg (Calculated) : 61.6  Vital Signs: Temp: 98.6 F (37 C) (10/13 1123) Temp Source: Oral (10/13 1123) BP: 117/63 mmHg (10/13 0433) Pulse Rate: 99 (10/13 0433)  Labs:  Recent Labs  08/25/15 0436  08/26/15 0433 08/26/15 2024 08/27/15 0515  HGB 8.5*  --  8.4*  --  8.6*  HCT 26.6*  --  26.9*  --  27.8*  PLT 115*  --  121*  --  144*  APTT 68*  --  63*  --   --   LABPROT 29.2*  --  24.2*  --  21.8*  INR 2.82*  --  2.19*  --  1.91*  CREATININE 1.38*  < > 1.23* 2.06* 2.73*  < > = values in this interval not displayed.  Estimated Creatinine Clearance: 17 mL/min (by C-G formula based on Cr of 2.73).   Assessment: [age] yoF w/ ESRD on HD presenting with 2d history of lethargy, decreased appetite and AMS  PMH: DM, HTN, HLD, Afib on warfarin, ESRD HD, CAD  AC/Heme- h/o Afib on coumadin PTA (Home warfarin dose = 6 mg on Mondays, 4.5 mg all other days).  INR 1.91 - warfarin restarted 10/12  Nephro- ESRD HD qTTs (to resume 10/14), off CRRT. on Aranesp qTues & IV nulecit qTTS w/HD  Heme: H&H 8.6/27.8, Plt 144  Goal of Therapy:  INR 2-3   Plan:  Warfarin 4.5 mg x 1 Daily INR, CBC q72h Monitor for s/sx of bleeding  Isaac BlissMichael Aundray Cartlidge, PharmD, BCPS Clinical Pharmacist Pager [phone] 08/27/2015 1:20 PM

## 2015-08-27 NOTE — Progress Notes (Signed)
Pt confused and resting in bed. Pt's RIJ HD cath dressing was off. Pt pulling at HD cath. Sutures and catheter intact. Skin tear noted on pt's right neck. Tegaderm placed over skin tear.  IV team paged to place new HD dressing. Pt re orineted to room. Will cont to monitor pt.

## 2015-08-27 NOTE — Progress Notes (Signed)
UR Completed Denise Bramblett [name], RN,BSN [phone]  

## 2015-08-27 NOTE — Care Management Important Message (Signed)
Important Message  Patient Details  Name: Megan Vasquez MRN: [id number] Date of Birth: 06/07/1941   Medicare Important Message Given:  Yes-third notification given    Kyla BalzarineShealy, Jasimine Simms Abena 08/27/2015, 10:56 AM

## 2015-08-27 NOTE — Progress Notes (Signed)
Admit: 08/15/2015 LOS: 9  45F ESRD [facility] via AVF w/ pulmonary edema/hypervolemia, colonic pneumatosis likeyl 2/2 ischemia, permAFib, known ASCVD.   Subjective:  Out of ICU, CRRT stopped yesterday More abd pain, low grade F overnight WBC back up K 4.4  10/12 0701 - 10/13 0700 In: 342 [P.O.:272; I.V.:70] Out: 370   Filed Weights   08/25/15 0500 08/26/15 0500 08/27/15 0433  Weight: 69.8 kg (153 lb 14.1 oz) 67 kg (147 lb 11.3 oz) 73.12 kg (161 lb 3.2 oz)    Scheduled Meds: . darbepoetin (ARANESP) injection - DIALYSIS  150 mcg Intravenous Q Tue-HD  . doxercalciferol  1 mcg Intravenous Q T,Th,Sa-HD  . feeding supplement  1 Container Oral TID BM  . insulin aspart  0-15 Units Subcutaneous 6 times per day  . insulin glargine  10 Units Subcutaneous QHS  . levothyroxine  250 mcg Oral QAC breakfast  . midodrine  10 mg Oral TID WC  . multivitamin  1 tablet Oral QHS  . pneumococcal 23 valent vaccine  0.5 mL Intramuscular Tomorrow-1000  . pravastatin  40 mg Oral q1800  . sodium chloride  3 mL Intravenous Q12H  . Warfarin - Pharmacist Dosing Inpatient   Does not apply q1800   Continuous Infusions:  PRN Meds:.sodium chloride, metoprolol, ondansetron **OR** ondansetron (ZOFRAN) IV, sodium chloride, sodium chloride  Current Labs: reviewed   Dialysis Orders: TTS [facility] 4 hrs 65.5kgs 2K/2Ca L AVF 1000u hep hectorol 2 venofor 100 q HD until 10/8 then 50 q week Micera 100 q2 weeks - to start 10/6  Physical Exam:  Blood pressure 117/63, pulse 99, temperature 98.3 F (36.8 C), temperature source Oral, resp. rate 28, height 5\' 7"  (1.702 m), weight 73.12 kg (161 lb 3.2 oz), SpO2 92 %. Chronically ill appearing abd w/ diffuse TTP no R/G IRIR, tachy, no rub CTAB LUE AVF + B/T  A/P 1. ESRD 1. Off CRRT since 10/12, BP improved 2. No strong reason for HD today, plan for 10/14 3. Outpt LUE AVF, TDC R IJ now placed for CRRT -- will remove TDC after firs successful HD via  AVF 2. Hypotension  1. Improved today, no pressors 2. Cont with mild RVR  3. rec stress dose steroids for relative AI 4. On midodrine 3. ?colonic ischemia -- CCS following, some concern for worsenign this AM 4. Permanent AFib: Cardiology has followed 5. ASCVD/hx/o CABG 6. Anemia, stable, monitor 1. ESA 150mc ASranesp Alinda MoneyqTues  Hibah Odonnell MD 08/27/2015, 9:24 AM   Recent Labs Lab 08/25/15 1515 08/26/15 0433 08/26/15 2024 08/27/15 0515  NA 132* 134* 132* 131*  K 4.1 3.9 4.7 4.4  CL 98* 99* 99* 98*  CO2 26 27 26 26   GLUCOSE 311* 122* 259* 140*  BUN 10 8 9 11   CREATININE 1.33* 1.23* 2.06* 2.73*  CALCIUM 7.2* 7.6* 7.9* 7.5*  PHOS 3.6 2.0* 1.6*  --     Recent Labs Lab 08/24/15 0510 08/25/15 0436 08/26/15 0433 08/27/15 0515  WBC 17.8* 16.4* 13.7* 17.0*  NEUTROABS 16.4*  --   --   --   HGB 9.3* 8.5* 8.4* 8.6*  HCT 30.2* 26.6* 26.9* 27.8*  MCV 93.5 93.3 94.7 93.9  PLT 111* 115* 121* 144*

## 2015-08-28 DIAGNOSIS — Z515 Encounter for palliative care: Secondary | ICD-10-CM | POA: Insufficient documentation

## 2015-08-28 LAB — CBC WITH DIFFERENTIAL/PLATELET
BASOS ABS: 0 10*3/uL (ref 0.0–0.1)
BASOS PCT: 0 %
EOS ABS: 0.3 10*3/uL (ref 0.0–0.7)
EOS PCT: 1 %
HEMATOCRIT: 23 % — AB (ref 36.0–46.0)
Hemoglobin: 7.2 g/dL — ABNORMAL LOW (ref 12.0–15.0)
Lymphocytes Relative: 5 %
Lymphs Abs: 1.2 10*3/uL (ref 0.7–4.0)
MCH: 29.1 pg (ref 26.0–34.0)
MCHC: 31.3 g/dL (ref 30.0–36.0)
MCV: 93.1 fL (ref 78.0–100.0)
Monocytes Absolute: 1.6 10*3/uL — ABNORMAL HIGH (ref 0.1–1.0)
Monocytes Relative: 7 %
NEUTROS PCT: 87 %
Neutro Abs: 19.4 10*3/uL — ABNORMAL HIGH (ref 1.7–7.7)
Platelets: 175 10*3/uL (ref 150–400)
RBC: 2.47 MIL/uL — AB (ref 3.87–5.11)
RDW: 16.6 % — AB (ref 11.5–15.5)
WBC: 22.5 10*3/uL — AB (ref 4.0–10.5)

## 2015-08-28 LAB — GLUCOSE, CAPILLARY
GLUCOSE-CAPILLARY: 70 mg/dL (ref 65–99)
GLUCOSE-CAPILLARY: 56 mg/dL — AB (ref 65–99)
GLUCOSE-CAPILLARY: 85 mg/dL (ref 65–99)
GLUCOSE-CAPILLARY: 194 mg/dL — AB (ref 65–99)
GLUCOSE-CAPILLARY: 209 mg/dL — AB (ref 65–99)
Glucose-Capillary: 162 mg/dL — ABNORMAL HIGH (ref 65–99)
Glucose-Capillary: 168 mg/dL — ABNORMAL HIGH (ref 65–99)

## 2015-08-28 LAB — CBC
HEMATOCRIT: 23.4 % — AB (ref 36.0–46.0)
HEMOGLOBIN: 7.4 g/dL — AB (ref 12.0–15.0)
MCH: 29.7 pg (ref 26.0–34.0)
MCHC: 31.6 g/dL (ref 30.0–36.0)
MCV: 94 fL (ref 78.0–100.0)
PLATELETS: 131 10*3/uL — AB (ref 150–400)
RBC: 2.49 MIL/uL — AB (ref 3.87–5.11)
RDW: 16.5 % — ABNORMAL HIGH (ref 11.5–15.5)
WBC: 15 10*3/uL — AB (ref 4.0–10.5)

## 2015-08-28 LAB — HEPARIN LEVEL (UNFRACTIONATED)
HEPARIN UNFRACTIONATED: 0.1 [IU]/mL — AB (ref 0.30–0.70)
Heparin Unfractionated: >2.2 IU/mL — ABNORMAL HIGH (ref 0.30–0.70)
Heparin Unfractionated: 0.51 IU/mL (ref 0.30–0.70)

## 2015-08-28 LAB — PREPARE RBC (CROSSMATCH)

## 2015-08-28 MED ORDER — SODIUM CHLORIDE 0.9 % IV SOLN: 100.0000 mL | INTRAVENOUS | Status: DC | PRN

## 2015-08-28 MED ORDER — LIDOCAINE HCL (PF) 1 % IJ SOLN: 5.0000 mL | INTRAMUSCULAR | Status: DC | PRN

## 2015-08-28 MED ORDER — ACETAMINOPHEN 325 MG PO TABS
650.0000 mg | ORAL_TABLET | Freq: Four times a day (QID) | ORAL | Status: DC | PRN
  Administered 2015-08-28 – 2015-09-01 (×7): 650 mg via ORAL
  Administered 2015-09-02: 500 mg via ORAL
  Administered 2015-09-02 – 2015-09-08 (×5): 650 mg via ORAL
  Filled 2015-08-28 (×13): qty 2

## 2015-08-28 MED ORDER — SODIUM PHOSPHATE 3 MMOLE/ML IV SOLN
30.0000 mmol | Freq: Once | INTRAVENOUS | Status: DC
  Filled 2015-08-28: qty 10

## 2015-08-28 MED ORDER — HEPARIN SODIUM (PORCINE) 1000 UNIT/ML DIALYSIS: 1000.0000 [IU] | INTRAMUSCULAR | Status: DC | PRN

## 2015-08-28 MED ORDER — SODIUM CHLORIDE 0.9 % IV SOLN
Freq: Once | INTRAVENOUS | Status: AC
  Administered 2015-09-05: 23:00:00 via INTRAVENOUS

## 2015-08-28 MED ORDER — LIDOCAINE-PRILOCAINE 2.5-2.5 % EX CREA: 1.0000 "application " | TOPICAL_CREAM | CUTANEOUS | Status: DC | PRN

## 2015-08-28 MED ORDER — HEPARIN SODIUM (PORCINE) 1000 UNIT/ML DIALYSIS: 1000.0000 [IU] | Freq: Once | INTRAMUSCULAR | Status: DC

## 2015-08-28 MED ORDER — PENTAFLUOROPROP-TETRAFLUOROETH EX AERO: 1.0000 "application " | INHALATION_SPRAY | CUTANEOUS | Status: DC | PRN

## 2015-08-28 MED ORDER — ALTEPLASE 2 MG IJ SOLR
2.0000 mg | Freq: Once | INTRAMUSCULAR | Status: DC | PRN
  Filled 2015-08-28: qty 2

## 2015-08-28 MED ORDER — SODIUM CHLORIDE 0.9 % IV SOLN: 250.0000 mL | INTRAVENOUS | Status: DC | PRN

## 2015-08-28 MED ORDER — ALTEPLASE 2 MG IJ SOLR: 2.0000 mg | Freq: Once | INTRAMUSCULAR | Status: DC | PRN

## 2015-08-28 NOTE — Progress Notes (Signed)
Admit: 03-Sep-2015 LOS: 10  64F ESRD THS [facility] via AVF w/ pulmonary edema/hypervolemia, colonic pneumatosis likeyl 2/2 ischemia, permAFib, known ASCVD.   Subjective:  Says no belly pain but distended and tender on exam- soft spoken WBC stable   10/13 0701 - 10/14 0700 In: 240 [P.O.:240] Out: -   Filed Weights   08/26/15 0500 08/27/15 0433 08/28/15 0500  Weight: 67 kg (147 lb 11.3 oz) 73.12 kg (161 lb 3.2 oz) 70.943 kg (156 lb 6.4 oz)    Scheduled Meds: . carvedilol  10 mg Oral Daily  . darbepoetin (ARANESP) injection - DIALYSIS  150 mcg Intravenous Q Tue-HD  . doxercalciferol  1 mcg Intravenous Q T,Th,Sa-HD  . feeding supplement  1 Container Oral TID BM  . insulin aspart  0-15 Units Subcutaneous TID WC & HS  . insulin glargine  10 Units Subcutaneous QHS  . levothyroxine  250 mcg Oral QAC breakfast  . midodrine  10 mg Oral TID WC  . multivitamin  1 tablet Oral QHS  . pneumococcal 23 valent vaccine  0.5 mL Intramuscular Tomorrow-1000  . pravastatin  40 mg Oral q1800  . sodium chloride  3 mL Intravenous Q12H   Continuous Infusions: . heparin 1,100 Units/hr (08/27/15 1720)   PRN Meds:.sodium chloride, metoprolol, ondansetron **OR** ondansetron (ZOFRAN) IV, sodium chloride, sodium chloride  Current Labs: reviewed   Dialysis Orders: TTS [facility] 4 hrs 65.5kgs 2K/2Ca L AVF 1000u hep hectorol 2 venofor 100 q HD until 10/8 then 50 q week Micera 100 q2 weeks - to start 10/6  Physical Exam:  Blood pressure 104/49, pulse 105, temperature 98.7 F (37.1 C), temperature source Oral, resp. rate 26, height  (1.702 m), weight 70.943 kg (156 lb 6.4 oz), SpO2 100 %. Chronically ill appearing abd w/ diffuse TTP no R/G IRIR, tachy, no rub CTAB LUE AVF + B/T Pitting edema to dependent areas   Vasquez/P 1. ESRD 1. Off CRRT since 10/12, BP improved 2.  plan for today off schedule 3. Outpt LUE AVF, TDC R IJ now placed for CRRT -- will remove TDC after first successful HD via  AVF 2. Hypotension  1. Improved today, no pressors- put back on coreg 2. Cont with mild RVR   3. On midodrine- given sodium is likely volume overloaded and that is problem 3. ?colonic ischemia -- CCS following, better- no abx  4. Permanent AFib: Cardiology has followed 5. ASCVD/hx/o CABG 6. Anemia, stable, monitor 1. ESA ASranesp qTues  Dispo- I agree this is likely to happen again- and is not over this episode yet with albumin in the 1's - poor prognosis- will replete phos as well   Megan Vasquez   08/28/2015, 10:31 AM   Recent Labs Lab 08/25/15 1515 08/26/15 0433 08/26/15 2024 08/27/15 0515  NA 132* 134* 132* 131*  K 4.1 3.9 4.7 4.4  CL 98* 99* 99* 98*  CO2 GLUCOSE 311* 122* 259* 140*  BUN CREATININE 1.33* 1.23* 2.06* 2.73*  CALCIUM 7.2* 7.6* 7.9* 7.5*  PHOS 3.6 2.0* 1.6*  --     Recent Labs Lab 08/24/15 0510  08/26/15 0433 08/27/15 0515 08/28/15 0257  WBC 17.8*  < > 13.7* 17.0* 15.0*  NEUTROABS 16.4*  --   --   --   --   HGB 9.3*  < > 8.4* 8.6* 7.4*  HCT 30.2*  < > 26.9* 27.8* 23.4*  MCV 93.5  < > 94.7 93.9 94.0  PLT 111*  < > 121* 144* 131*  < > = values in this interval not displayed.

## 2015-08-28 NOTE — Progress Notes (Signed)
    Called daughter and daughter-in-law  Updated on change in status ? Abd pain, ?Hb from earlier today  Nursing notified orange coloured stool earlier today-all concerning for probable meseneteric ischemia    P  D/w Dr. Wende BushyWakefield-patient is at high risk for any procedure wityh mortality being close to 100 % D/w family extensively GOC and they will decide again and go over thoughts about this Transfuse 1 U Prbc stat now Might hold off on Heparin Gtt given possible active bleed Patient's family wishes to have FULL CODE and to do everything possible and would want surgery going forward if needed.  Megan KochJai Jeshurun [place], MD [facility] [phone]([phone]

## 2015-08-28 NOTE — Clinical Social Work Placement (Signed)
   CLINICAL SOCIAL WORK PLACEMENT  NOTE  Date:  08/28/2015  Patient Details  Name: Fredirick MaudlinMarjorie Galeno MRN: [id number] Date of Birth: 05/22/1941  Clinical Social Work is seeking post-discharge placement for this patient at the Skilled  Nursing Facility level of care (*CSW will initial, date and re-position this form in  chart as items are completed):  Yes   Patient/family provided with [place] Clinical Social Work Department's list of facilities offering this level of care within the geographic area requested by the patient (or if unable, by the patient's family).  Yes   Patient/family informed of their freedom to choose among providers that offer the needed level of care, that participate in Medicare, Medicaid or managed care program needed by the patient, have an available bed and are willing to accept the patient.  Yes   Patient/family informed of [facility]'s ownership interest [facility] and [facility], as well as of the fact that they are under no obligation to receive care at these facilities.  PASRR submitted to EDS on 08/28/15     PASRR number received on 08/28/15     Existing PASRR number confirmed on       FL2 transmitted to all facilities in geographic area requested by pt/family on 08/28/15     FL2 transmitted to all facilities within larger geographic area on       Patient informed that his/her managed care company has contracts with or will negotiate with certain facilities, including the following:            Patient/family informed of bed offers received.  Patient chooses bed at       Physician recommends and patient chooses bed at      Patient to be transferred to   on  .  Patient to be transferred to facility by       Patient family notified on   of transfer.  Name of family member notified:        PHYSICIAN Please prepare priority discharge summary, including medications, Please sign FL2, Please prepare prescriptions     Additional  Comment:    _______________________________________________ Roddie McBryant Khila Papp MSW, LCSW, [place], [phone]

## 2015-08-28 NOTE — Clinical Social Work Note (Signed)
Clinical Social Work Assessment  Patient Details  Name: Megan Vasquez MRN: [id number] Date of Birth: Nov 17, 1940  Date of referral:  08/28/15               Reason for consult:  Facility Placement, Discharge Planning                Permission sought to share information with:  Family Supports, Oceanographer granted to share information::  Yes, Verbal Permission Granted  Name::     Megan Vasquez and Educational psychologist::  SNFs  Relationship::     Contact Information:     Housing/Transportation Living arrangements for the past 2 months:  Single Family Home (Patient lives with her grandson who is her caregiver in a two story town home) Source of Information:  Other (Comment Required) (Patient's daughter in Company secretary provided information for assessment. ) Patient Interpreter Needed:  None Criminal Activity/Legal Involvement Pertinent to Current Situation/Hospitalization:  No - Comment as needed Significant Relationships:  Adult Children, Other Family Members Lives with:  Other (Comment) (Lives with grandson) Do you feel safe going back to the place where you live?  No Need for family participation in patient care:  No (Coment)  Care giving concerns:  Patient's daughter in law Megan Vasquez states that the family would be concerned about the patient returning home in her current condition since she lives in a two story town home.   Social Worker assessment / plan:  CSW spoke with patient's daughter in law by phone to complete assessment as this is the bold contact on face sheet. Megan Vasquez states that the patient lives at home with her grandson who is her main caregiver. She states that the grandson would not be able to assist her to the second floor of the town home in her current condition and that the patient needs to be able to ambulate and transfer. CSW explained that per the PT evaluations the patient is not capable of this as of 10/12. Megan Vasquez states she will need to discuss this with  her partner Megan Vasquez (patient's daughter) prior to making a decision about the patient going to SNF. CSW has prepared referral and will just need to hear back from the family before SNFs can be contacted. Megan Vasquez states that the family plans to speak with palliative tomorrow to make some decisions regarding goals of care. Patient will likely be ready for DC this weekend so she will need to be placed in a SNF that is willing to take a letter of guarantee pending [employer] Authorization. CSW will wait for call back from family.   UPDATE: Megan Vasquez has called back and requests referral be made. CSW thoroughly explained the LOG placement process in the event this needs to be used.   Employment status:  Disabled (Comment on whether or not currently receiving Disability) Insurance information:  Teacher, English as a foreign language PT Recommendations:  Skilled Nursing Facility, 24 Hour Supervision Information / Referral to community resources:  Skilled Nursing Facility  Patient/Family's Response to care:  Patient's daughter in Company secretary did voice concern about not being told that the patient was working with PT. Beside this they appear to be happy with the care the patient has received.   Patient/Family's Understanding of and Emotional Response to Diagnosis, Current Treatment, and Prognosis:  Patient's family appears to have good understanding of reason for admission, diagnosis, and seem to have realistic expectations of patient's post DC needs.   Emotional Assessment Appearance:  Appears stated age Attitude/Demeanor/Rapport:  Unable to  Assess Affect (typically observed):  Unable to Assess Orientation:  Oriented to Self Alcohol / Substance use:  Tobacco Use (Hx of) Psych involvement (Current and /or in the community):  No (Comment)  Discharge Needs  Concerns to be addressed:  Discharge Planning Concerns Readmission within the last 30 days:  Yes Current discharge risk:  Chronically ill, Physical Impairment, Cognitively  Impaired Barriers to Discharge:  Continued Medical Work up   Megan Vasquez MSW, SheridaThe Krogern LakeLCSW, [place], [phone]

## 2015-08-28 NOTE — Progress Notes (Signed)
Subjective: Eating breakfast, awake more this am, does not complain of abd pain  Objective: Vital signs in last 24 hours: Temp:  [97.8 F (36.6 C)-98.7 F (37.1 C)] 98.7 F (37.1 C) (10/14 0723) Pulse Rate:  [95-105] 105 (10/14 0500) Resp:  [23-26] 26 (10/14 0500) BP: (104-115)/(49-58) 104/49 mmHg (10/14 0500) SpO2:  [98 %-100 %] 100 % (10/14 0723) Weight:  [70.943 kg (156 lb 6.4 oz)] 70.943 kg (156 lb 6.4 oz) (10/14 0500) Last BM Date: 08/25/15  Intake/Output from previous day: 10/13 0701 - 10/14 0700 In: 240 [P.O.:240] Out: -  Intake/Output this shift:    GI: soft nontender  Lab Results:   Recent Labs  08/27/15 0515 08/28/15 0257  WBC 17.0* 15.0*  HGB 8.6* 7.4*  HCT 27.8* 23.4*  PLT 144* 131*   BMET  Recent Labs  08/26/15 2024 08/27/15 0515  NA 132* 131*  K 4.7 4.4  CL 99* 98*  CO2 26 26  GLUCOSE 259* 140*  BUN 9 11  CREATININE 2.06* 2.73*  CALCIUM 7.9* 7.5*   PT/INR  Recent Labs  08/26/15 0433 08/27/15 0515  LABPROT 24.2* 21.8*  INR 2.19* 1.91*   ABG No results for input(s): PHART, HCO3 in the last 72 hours.  Invalid input(s): PCO2, PO2  Studies/Results: No results found.  Anti-infectives: Anti-infectives    Start     Dose/Rate Route Frequency Ordered Stop   08/23/15 1800  vancomycin (VANCOCIN) IVPB 750 mg/150 ml premix  Status:  Discontinued     750 mg 150 mL/hr over 60 Minutes Intravenous Every 24 hours 08/22/15 1419 08/22/15 1422   08/23/15 1300  anidulafungin (ERAXIS) 100 mg in sodium chloride 0.9 % 100 mL IVPB  Status:  Discontinued     100 mg over 90 Minutes Intravenous Every 24 hours 08/22/15 1236 08/24/15 1001   08/23/15 0800  vancomycin (VANCOCIN) IVPB 750 mg/150 ml premix  Status:  Discontinued     750 mg 150 mL/hr over 60 Minutes Intravenous Every 24 hours 08/22/15 1422 08/24/15 1331   08/22/15 1600  piperacillin-tazobactam (ZOSYN) IVPB 2.25 g  Status:  Discontinued     2.25 g 100 mL/hr over 30 Minutes Intravenous  Every 6 hours 08/22/15 1419 08/25/15 0955   08/22/15 1300  anidulafungin (ERAXIS) 200 mg in sodium chloride 0.9 % 200 mL IVPB     200 mg over 180 Minutes Intravenous  Once 08/22/15 1234 08/22/15 2029   08/22/15 1215  anidulafungin (ERAXIS) 200 mg in sodium chloride 0.9 % 200 mL IVPB  Status:  Discontinued     200 mg over 180 Minutes Intravenous Every 24 hours 08/22/15 1208 08/22/15 1234   08/22/15 1200  vancomycin (VANCOCIN) IVPB 750 mg/150 ml premix  Status:  Discontinued     750 mg 150 mL/hr over 60 Minutes Intravenous Every T-Th-Sa (Hemodialysis) 08/21/15 0602 08/22/15 1407   08/21/15 0615  vancomycin (VANCOCIN) 1,500 mg in sodium chloride 0.9 % 500 mL IVPB     1,500 mg 250 mL/hr over 120 Minutes Intravenous  Once 08/21/15 0602 08/21/15 0844   08/19/15 0600  piperacillin-tazobactam (ZOSYN) IVPB 2.25 g  Status:  Discontinued    Comments:  Zosyn 2.25 g IV q8h in ESRD   2.25 g 100 mL/hr over 30 Minutes Intravenous 3 times per day 08/19/15 0539 08/22/15 1407      Assessment/Plan: Abdominal pain/pneumatosis-CT revealed pneumatosis, colonic thickening. wbc a little better, exam normal today. Would continue to follow clinically and allow her to take diet.  She is at  high risk of these issues again due to underlying disease. I think a conversation with palliative care about goals and plans for if this happens again would be appropriate. I do not think she would do well with colectomy and ileostomy long term  [facility] 08/28/2015

## 2015-08-28 NOTE — Progress Notes (Signed)
Physical Therapy Treatment Patient Details Name: Megan Vasquez MRN: [id number] DOB: 08-Dec-1940 Today's Date: 08/28/2015    History of Present Illness pt presents with colitis and abdominal pain.  pt with hx of ESRD, A-fib, CKD, DM, HTN, CVA, and CABG.    PT Comments    Pt unsteady, but did attempt to A with mobility today to get up to recliner.  Continue to feel pt will need SNF at D/C to maximize independence and decrease burden of care.    Follow Up Recommendations  SNF     Equipment Recommendations  None recommended by PT    Recommendations for Other Services       Precautions / Restrictions Precautions Precautions: Fall Restrictions Weight Bearing Restrictions: No    Mobility  Bed Mobility Overal bed mobility: Needs Assistance;+2 for physical assistance Bed Mobility: Supine to Sit     Supine to sit: Max assist;+2 for physical assistance     General bed mobility comments: pt did seem to attempt to move LEs when asked, but needed A for all aspects of bed mobility.    Transfers Overall transfer level: Needs assistance Equipment used: 2 person hand held assist Transfers: Sit to/from [employer] Sit to Stand: Max assist;+2 physical assistance Stand pivot transfers: Max assist;+2 physical assistance       General transfer comment: pt needs increased A to initiate mobility, but did attempt to help come to stand and moved LEs through pivot to recliner.  Utilized pad under hips to A with coming to stand.  Ambulation/Gait                 Stairs            Wheelchair Mobility    Modified Rankin (Stroke Patients Only)       Balance Overall balance assessment: Needs assistance Sitting-balance support: Bilateral upper extremity supported;Feet supported Sitting balance-Leahy Scale: Poor     Standing balance support: During functional activity Standing balance-Leahy Scale: Zero                      Cognition  Arousal/Alertness: Lethargic Behavior During Therapy: Flat affect Overall Cognitive Status: No family/caregiver present to determine baseline cognitive functioning Area of Impairment: Orientation;Attention;Memory;Following commands;Safety/judgement;Awareness;Problem solving Orientation Level: Disoriented to;Situation;Time Current Attention Level: Sustained Memory: Decreased short-term memory Following Commands: Follows one step commands inconsistently Safety/Judgement: Decreased awareness of safety;Decreased awareness of deficits Awareness: Intellectual Problem Solving: Slow processing;Decreased initiation;Difficulty sequencing;Requires verbal cues;Requires tactile cues General Comments: No family to determine baseline level of cognition.      Exercises      General Comments        Pertinent Vitals/Pain Pain Assessment: Faces Faces Pain Scale: Hurts little more Pain Location: pt grimaces, but does not state where pain is located when asked.   Pain Descriptors / Indicators: Grimacing Pain Intervention(s): Monitored during session;Repositioned    Home Living                      Prior Function            PT Goals (current goals can now be found in the care plan section) Acute Rehab PT Goals Patient Stated Goal: pt did not state. PT Goal Formulation: Patient unable to participate in goal setting Time For Goal Achievement: 08/25/2015 Potential to Achieve Goals: Good Progress towards PT goals: Progressing toward goals    Frequency  Min 2X/week    PT Plan Frequency needs to be updated  Co-evaluation             End of Session Equipment Utilized During Treatment: Gait belt;Oxygen Activity Tolerance: Patient limited by fatigue Patient left: in chair;with call bell/phone within reach;with chair alarm set     Time: [id number] PT Time Calculation (min) (ACUTE ONLY): 19 min  Charges:  $Therapeutic Activity: 8-22 mins                    G CodesSunny Schlein:       Shakeena Kafer F, [place] CarolinaPT [phone] 08/28/2015, 12:36 PM

## 2015-08-28 NOTE — Progress Notes (Signed)
Megan Vasquez XMD:[id number] DOB: 1941-09-17 DOA: 08/22/2015 PCP: Maximino Greenland, MD  Brief narrative:  74 y/o ? ESRD dialsyes m/w/f @ [facility] Afib on coumadin Ty ii DM Htn   Admitted c Afib /RVR and cardiology consulted CT scan date of admission=?Nec entercolitis and seen by Gen surgery subsequently  Due to transient MS changes on 10/8 and hypotension CCM evaluated the patient and subsequently had dialysis catheter placd and needed CVVH and was transitioned back to HD  Has stabilized but still tenuous state Poor operative candidate hence palliative care has been consulted    Past medical history-As per Problem list Chart reviewed as below-   Consultants:  Nephro  Cardiology s/o 10/11  Procedures:    Antibiotics:  Vanc + Zosyn 10/4-->10/11   Subjective   Daughter in room today tells me her MS fluctuates with her CBG's She is more alert when i see her later in day.  Tol some diet no CP/sob/bluured vision Can orient somewhat compared to prior No f/chill/rigors Mild abd pain   Objective    Interim History:   Telemetry:    Objective: Filed Vitals:   08/28/15 0500 08/28/15 0723 08/28/15 1113 08/28/15 1114  BP: 104/49   107/58  Pulse: 105     Temp: 98 F (36.7 C) 98.7 F (37.1 C) 99.2 F (37.3 C)   TempSrc: Oral Oral Oral   Resp: 26   24  Height:      Weight: 70.943 kg (156 lb 6.4 oz)     SpO2: 100% 100% 100%     Intake/Output Summary (Last 24 hours) at 08/28/15 1255 Last data filed at 08/28/15 1231  Gross per 24 hour  Intake    295 ml  Output      0 ml  Net    295 ml    Exam:  General: eomi ncat Cardiovascular: s1 s 2 tachy Respiratory:  clea rno added soudn Abdomen:  Soft nt slight distension, no rebound Skin no le edema Neuro intact  Data Reviewed: Basic Metabolic Panel:  Recent Labs Lab 08/23/15 0506  08/24/15 0510 08/24/15 1520 08/25/15 0436 08/25/15 1515 08/26/15 0433 08/26/15 2024 08/27/15 0515  NA 135  < >  134* 132* 132* 132* 134* 132* 131*  K 3.8  < > 4.3 4.1 3.9 4.1 3.9 4.7 4.4  CL 96*  < > 100* 98* 98* 98* 99* 99* 98*  CO2 28  < > 26 25 27 26 27 26 26   GLUCOSE 181*  < > 228* 233* 158* 311* 122* 259* 140*  BUN 10  < > 8 8 8 10 8 9 11   CREATININE 2.05*  < > 1.53* 1.49* 1.38* 1.33* 1.23* 2.06* 2.73*  CALCIUM 7.3*  < > 7.6* 7.2* 7.2* 7.2* 7.6* 7.9* 7.5*  MG 1.9  --  2.2  --  2.2  --  2.3  --   --   PHOS 2.2*  < > 1.8* 3.5 1.9* 3.6 2.0* 1.6*  --   < > = values in this interval not displayed. Liver Function Tests:  Recent Labs Lab 08/22/15 0148  08/24/15 1520 08/25/15 0436 08/25/15 1515 08/26/15 0433 08/26/15 2024  AST 33  --   --   --   --   --   --   ALT 22  --   --   --   --   --   --   ALKPHOS 79  --   --   --   --   --   --  BILITOT 0.9  --   --   --   --   --   --   PROT 5.4*  --   --   --   --   --   --   ALBUMIN 1.4*  < > 1.4* 1.4* 1.4* 1.5* 1.5*  < > = values in this interval not displayed. No results for input(s): LIPASE, AMYLASE in the last 168 hours. No results for input(s): AMMONIA in the last 168 hours. CBC:  Recent Labs Lab 08/24/15 0510 08/25/15 0436 08/26/15 0433 08/27/15 0515 08/28/15 0257  WBC 17.8* 16.4* 13.7* 17.0* 15.0*  NEUTROABS 16.4*  --   --   --   --   HGB 9.3* 8.5* 8.4* 8.6* 7.4*  HCT 30.2* 26.6* 26.9* 27.8* 23.4*  MCV 93.5 93.3 94.7 93.9 94.0  PLT 111* 115* 121* 144* 131*   Cardiac Enzymes: No results for input(s): CKTOTAL, CKMB, CKMBINDEX, TROPONINI in the last 168 hours. BNP: Invalid input(s): POCBNP CBG:  Recent Labs Lab 08/28/15 0044 08/28/15 1655 08/28/15 0721 08/28/15 0831 08/28/15 1115  GLUCAP 168* 70 56* 85 194*    Recent Results (from the past 240 hour(s))  Culture, blood (routine x 2)     Status: None   Collection Time: 09/06/2015  8:20 PM  Result Value Ref Range Status   Specimen Description BLOOD RIGHT WRIST  Final   Special Requests IN PEDIATRIC BOTTLE 3CC  Final   Culture NO GROWTH 5 DAYS  Final   Report Status  08/23/2015 FINAL  Final  Culture, blood (routine x 2)     Status: None   Collection Time: 08/16/2015  8:30 PM  Result Value Ref Range Status   Specimen Description BLOOD RIGHT HAND  Final   Special Requests IN PEDIATRIC BOTTLE 3CC  Final   Culture NO GROWTH 5 DAYS  Final   Report Status 08/23/2015 FINAL  Final  Culture, blood (routine x 2)     Status: None   Collection Time: 08/21/15  7:50 AM  Result Value Ref Range Status   Specimen Description BLOOD HAND RIGHT  Final   Special Requests BOTTLES DRAWN AEROBIC AND ANAEROBIC 5CC  Final   Culture NO GROWTH 5 DAYS  Final   Report Status 08/26/2015 FINAL  Final  Culture, blood (routine x 2)     Status: None   Collection Time: 08/21/15  8:00 AM  Result Value Ref Range Status   Specimen Description BLOOD HAND RIGHT  Final   Special Requests BOTTLES DRAWN AEROBIC ONLY 10CC  Final   Culture NO GROWTH 5 DAYS  Final   Report Status 08/26/2015 FINAL  Final  MRSA PCR Screening     Status: None   Collection Time: 08/22/15  3:26 PM  Result Value Ref Range Status   MRSA by PCR NEGATIVE NEGATIVE Final    Comment:        The GeneXpert MRSA Assay (FDA approved for NASAL specimens only), is one component of a comprehensive MRSA colonization surveillance program. It is not intended to diagnose MRSA infection nor to guide or monitor treatment for MRSA infections.      Studies:              All Imaging reviewed and is as per above notation   Scheduled Meds: . carvedilol  10 mg Oral Daily  . darbepoetin (ARANESP) injection - DIALYSIS  150 mcg Intravenous Q Tue-HD  . doxercalciferol  1 mcg Intravenous Q T,Th,Sa-HD  . feeding supplement  1  Container Oral TID BM  . insulin aspart  0-15 Units Subcutaneous TID WC & HS  . insulin glargine  10 Units Subcutaneous QHS  . levothyroxine  250 mcg Oral QAC breakfast  . midodrine  10 mg Oral TID WC  . multivitamin  1 tablet Oral QHS  . pneumococcal 23 valent vaccine  0.5 mL Intramuscular Tomorrow-1000    . pravastatin  40 mg Oral q1800  . sodium chloride  3 mL Intravenous Q12H  . sodium phosphate  Dextrose 5% IVPB  30 mmol Intravenous Once   Continuous Infusions: . heparin 1,100 Units/hr (08/27/15 1720)     Assessment/Plan:  1. Nec enterocolitis-appreciate surg input.  Currently on no abx. Low grade temp overnight Diet graduation and other recs dependant on them.  Poor overall surgical candidate.  would not re-image at present.  Consulted palliative care to discuss with patient and family goals of care-note that his hemoglobin is up from 8.6-7 .4 this a.m. and I'll repeat one stat--this transfer the we may need surgery to reevaluate 2. ESRD TTS-[facility].  dialysing 10/14.  Continue Midrodrine 10 tid for BP support. TDC for removal.  Dry wght 65kg       Afib + RVR-Cards s/o 10/11-continue prn metoprolol-added Coreg 10 XL in am.  Give 1/2 this dose on dialysis days?  if difficulty controlling rate may need input re: cardiology medical management. Currently rate is controlled below 120.  Coumadin held 2/2 ? risk GIB-INR 1.9. Start heparin [high risk for surgery but may need it done]-do not transition to Coumadin yet.  3. DM tyii-change to QID coverage, continue lantus 10.  CBG's are 100-200.  improved  4. Hypothyroidism Levothyroxine 250 mcg.  Large dose.  Close recheck TFT's as op 5. Mod-severe MAR/PAH-medical management-not candidate operative 6. AOCD/Renal disease-ARanesp q tues.  No drop in hemoglobin 8.6--7.4. Rechecking today. See above 7. 2/2 hypoparathyroidism-cont doxercalcif 1 mcg tts.  Per renal 8. hld-cont prvachol 40 daily 9. Met family states that this is secondary to blood sugar fluctuations.  We will monitor    Code Status: Full code Family Communication: Discussed with daughter earlier today. Disposition Plan: Out of bed, possible skilled facility placement DVT prophylaxis: SCD Consultants: renal  Verneita Griffes, MD  [facility] Pager [phone] 08/28/2015,  12:55 PM    LOS: 10 days

## 2015-08-28 NOTE — Progress Notes (Signed)
Inpatient Diabetes Program Recommendations  AACE/ADA: New Consensus Statement on Inpatient Glycemic Control (2015)  Target Ranges:  Prepandial:   less than 140 mg/dL      Peak postprandial:   less than 180 mg/dL (1-2 hours)      Critically ill patients:  140 - 180 mg/dL   Results for Megan Vasquez, Megan Vasquez (MRN [id number]) as of 08/28/2015 08:44  Ref. Range 08/27/2015 07:45 08/27/2015 11:23 08/27/2015 16:29 08/27/2015 20:02 08/27/2015 22:04 08/28/2015 00:44 08/28/2015 06:13 08/28/2015 07:21 08/28/2015 08:31  Glucose-Capillary Latest Ref Range: 65-99 mg/dL 409100 (H) 811197 (H) 914317 (H) 318 (H) 244 (H) 168 (H) 70 56 (L) 85   Review of Glycemic Control  Diabetes history: DM 2 Outpatient Diabetes medications: Lantus 10 units BID Current orders for Inpatient glycemic control: Lantus 10 units QHS, Novolog Moderate TID  Inpatient Diabetes Program Recommendations: Insulin - Basal: Patient had hypoglycemia in the 50's this am. Please consider decreasing basal slightly to 8 units QHS. Insulin - Meal Coverage: Patient had hyperglycemia in the 300's around meal times yesterday. Please consider adding Novolog 5 units TID of meal coverage if patient is eating at least 50% of meals.  Thanks,  Christena DeemShannon Lyrik Dockstader RN, MSN, [facility] Inpatient Diabetes Coordinator Team Pager [phone] (8a-5p)

## 2015-08-28 NOTE — Progress Notes (Signed)
Family meeting set with pt's daughter for goals of care discussion on 08-29-15 at 0830 Thank you for consulting Palliative Medicine Eduard RouxSarah Analei Whinery, ANP-ACHPN

## 2015-08-28 NOTE — Progress Notes (Addendum)
ANTICOAGULATION CONSULT NOTE - Follow Up Consult  Pharmacy Consult for Heparin  Indication: atrial fibrillation  Labs:  Recent Labs  08/25/15 0436  08/26/15 0433 08/26/15 2024 08/27/15 0515 08/28/15 0257  HGB 8.5*  --  8.4*  --  8.6* 7.4*  HCT 26.6*  --  26.9*  --  27.8* 23.4*  PLT 115*  --  121*  --  144* 131*  APTT 68*  --  63*  --   --   --   LABPROT 29.2*  --  24.2*  --  21.8*  --   INR 2.82*  --  2.19*  --  1.91*  --   HEPARINUNFRC  --   --   --   --   --  >2.20*  CREATININE 1.38*  < > 1.23* 2.06* 2.73*  --   < > = values in this interval not displayed.   Assessment: Inaccurate heparin level, lab was drawn next to heparin infusion site  Goal of Therapy:  Heparin level 0.3-0.7 units/ml Monitor platelets by anticoagulation protocol: Yes   Plan:  -Re-drawn HL  Megan Vasquez 08/28/2015,4:07 AM  =========================== Addendum 6:28 AM Repeat HL is good at 0.51 -Continue heparin at 1100 units/hr -1400 HL to confirm Wilmer FloorJLedford, PharmD

## 2015-08-29 LAB — CBC
HCT: 27.5 % — ABNORMAL LOW (ref 36.0–46.0)
Hemoglobin: 8.8 g/dL — ABNORMAL LOW (ref 12.0–15.0)
MCH: 29.7 pg (ref 26.0–34.0)
MCHC: 32 g/dL (ref 30.0–36.0)
MCV: 92.9 fL (ref 78.0–100.0)
PLATELETS: 158 10*3/uL (ref 150–400)
RBC: 2.96 MIL/uL — ABNORMAL LOW (ref 3.87–5.11)
RDW: 16.2 % — AB (ref 11.5–15.5)
WBC: 12.2 10*3/uL — AB (ref 4.0–10.5)

## 2015-08-29 LAB — GLUCOSE, CAPILLARY
GLUCOSE-CAPILLARY: 389 mg/dL — AB (ref 65–99)
GLUCOSE-CAPILLARY: 257 mg/dL — AB (ref 65–99)
GLUCOSE-CAPILLARY: 261 mg/dL — AB (ref 65–99)
GLUCOSE-CAPILLARY: 85 mg/dL (ref 65–99)
GLUCOSE-CAPILLARY: 129 mg/dL — AB (ref 65–99)

## 2015-08-29 MED ORDER — INSULIN ASPART 100 UNIT/ML ~~LOC~~ SOLN
0.0000 [IU] | Freq: Three times a day (TID) | SUBCUTANEOUS | Status: DC
  Administered 2015-08-30: 3 [IU] via SUBCUTANEOUS

## 2015-08-29 MED ORDER — INSULIN ASPART 100 UNIT/ML ~~LOC~~ SOLN: 0.0000 [IU] | SUBCUTANEOUS | Status: DC

## 2015-08-29 MED ORDER — INSULIN ASPART 100 UNIT/ML ~~LOC~~ SOLN
5.0000 [IU] | Freq: Once | SUBCUTANEOUS | Status: AC
  Administered 2015-08-29: 5 [IU] via SUBCUTANEOUS

## 2015-08-29 NOTE — Progress Notes (Signed)
Admit: 08/30/2015 LOS: 11  71F ESRD [facility] via AVF w/ pulmonary edema/hypervolemia, colonic pneumatosis likeyl 2/2 ischemia, permAFib, known ASCVD.   Subjective:  Had HD yest- removed 1900- no really low BP recorded Says no belly pain but distended and tender on exam- soft spoken WBC up last PM but down this AM    10/14 0701 - 10/15 0700 In: 982.8 [P.O.:477; I.V.:170.8; Blood:335] Out: 1905   [facility]   08/28/15 1710 08/28/15 2109 08/29/15 0426  Weight: 73.1 kg (161 lb 2.5 oz) 70.6 kg (155 lb 10.3 oz) 77.565 kg (171 lb)    Scheduled Meds: . sodium chloride   Intravenous Once  . carvedilol  10 mg Oral Daily  . darbepoetin (ARANESP) injection - DIALYSIS  150 mcg Intravenous Q Tue-HD  . doxercalciferol  1 mcg Intravenous Q T,Th,Sa-HD  . feeding supplement  1 Container Oral TID BM  . insulin aspart  0-15 Units Subcutaneous TID WC & HS  . insulin glargine  10 Units Subcutaneous QHS  . levothyroxine  250 mcg Oral QAC breakfast  . midodrine  10 mg Oral TID WC  . multivitamin  1 tablet Oral QHS  . pneumococcal 23 valent vaccine  0.5 mL Intramuscular Tomorrow-1000  . pravastatin  40 mg Oral q1800  . sodium chloride  3 mL Intravenous Q12H  . sodium phosphate  Dextrose 5% IVPB  30 mmol Intravenous Once   Continuous Infusions:   PRN Meds:.sodium chloride, acetaminophen, metoprolol, ondansetron **OR** ondansetron (ZOFRAN) IV, sodium chloride, sodium chloride  Current Labs: reviewed   Dialysis Orders: TTS [facility] 4 hrs 65.5kgs 2K/2Ca L AVF 1000u hep hectorol 2 venofor 100 q HD until 10/8 then 50 q week Micera 100 q2 weeks - to start 10/6  Physical Exam:  Blood pressure 118/49, pulse 115, temperature 99.6 F (37.6 C), temperature source Oral, resp. rate 23, height 5\' 7"  (1.702 m), weight 77.565 kg (171 lb), SpO2 100 %. Chronically ill appearing abd w/ diffuse TTP no R/G IRIR, tachy, no rub CTAB LUE AVF + B/T Pitting edema to dependent areas   A/P 1. ESRD 1. Off  CRRT since 10/12, BP improved 2.  done Friday off schedule- will plan for next on Monday (m, thurs, sat next week?) 2. Outpt LUE AVF, TDC R IJ now placed for CRRT -- will order to remove TDC , used AVF yest 3. Hypotension  1. Improved today, no pressors- put back on coreg ( coreg and midodrine ?) 2. Cont with mild RVR   3. On midodrine- given sodium is likely volume overloaded and that is problem 4. ?colonic ischemia -- CCS following, better- no abx - palliative care has talked to family- no surgery will be done 5. Permanent AFib: Cardiology has followed 6. ASCVD/hx/o CABG 7. Anemia, stable, monitor 1. ESA 150mc ASranesp qTues- given one unit PRBC yest with HD  8. Hypophos- given repletion Friday- will check level in AM  Dispo- I agree this is likely to happen again- and is not over this episode yet with albumin in the 1's - poor prognosis- will need to be able to sit for HD on Monday in order for OP dialysis to continue- if she is unable to do this may be another talking point for possible comfort care moving forward.  Working  toward a NCB status with palliative care  Marji Kuehnel A   08/29/2015, 9:50 AM   Recent Labs Lab 08/25/15 1515 08/26/15 0433 08/26/15 2024 08/27/15 0515  NA 132* 134* 132* 131*  K  4.1 3.9 4.7 4.4  CL 98* 99* 99* 98*  CO2 GLUCOSE 311* 122* 259* 140*  BUN CREATININE 1.33* 1.23* 2.06* 2.73*  CALCIUM 7.2* 7.6* 7.9* 7.5*  PHOS 3.6 2.0* 1.6*  --     Recent Labs Lab 08/24/15 0510  08/28/15 0257 08/28/15 1413 08/29/15 0318  WBC 17.8*  < > 15.0* 22.5* 12.2*  NEUTROABS 16.4*  --   --  19.4*  --   HGB 9.3*  < > 7.4* 7.2* 8.8*  HCT 30.2*  < > 23.4* 23.0* 27.5*  MCV 93.5  < > 94.0 93.1 92.9  PLT 111*  < > 131* 175 158  < > = values in this interval not displayed.

## 2015-08-29 NOTE — Progress Notes (Signed)
Palliative Care note reviewed. Patient is a partial code but at present- SNF search continues as d/c plan of choice rather than Hospice services. CSW spoke with patient's nurse who indicated that patient is currently unable to sit up for a full dialysis treatment; they will try again on Monday. For outpatient dialysis- she must be able to sit up for full treatment in chair.  Per MD-patient is not medically stable for d/c. Will continue to monitor and assist with d/c as indicated.  Lorri Frederickonna T. Jaci LazierCrowder, KentuckyLCSW [phone] (weekend coverage)

## 2015-08-29 NOTE — Progress Notes (Signed)
Megan Vasquez Callahan NP made aware of pt c/o R foot pain.Foot warm to touch. + pulses. Pt says she has had this pain off and on 8/10 at present time.

## 2015-08-29 NOTE — Progress Notes (Signed)
Megan Vasquez OLM:[id number] DOB: 01/04/1941 DOA: 08/19/2015 PCP: Maximino Greenland, MD  Brief narrative:  74 y/o ? ESRD dialsyes m/w/f [facility] Afib on coumadin Ty ii DM Htn   Admitted c Afib /RVR and cardiology consulted CT scan date of admission=?Nec entercolitis and seen by Gen surgery subsequently  Due to transient MS changes on 10/8 and hypotension CCM evaluated the patient and subsequently had dialysis catheter placd and needed CVVH and was transitioned back to HD  Has stabilized but still tenuous state Poor operative candidate hence palliative care has been consulted    Past medical history-As per Problem list Chart reviewed as below-   Consultants:  Nephro  Cardiology s/o 10/11  Procedures:    Antibiotics:  Vanc + Zosyn 10/4-->10/11   Subjective   Fair Clear liquids gong ok No further stool No cp Can only tell me her name and Hospital and city--cannot orient further to county no state No f no chill    Objective    Interim History:   Telemetry:    Objective: Filed Vitals:   08/29/15 0200 08/29/15 0426 08/29/15 0900 08/29/15 1151  BP: 118/49  124/54 127/64  Pulse: 115   106  Temp: 99.9 F (37.7 C) 99.6 F (37.6 C)    TempSrc: Oral Oral    Resp: 23  30 24   Height:      Weight:  77.565 kg (171 lb)    SpO2: 100%   100%    Intake/Output Summary (Last 24 hours) at 08/29/15 1512 Last data filed at 08/29/15 1326  Gross per 24 hour  Intake 1254.75 ml  Output   1905 ml  Net -650.25 ml    Exam:  General: eomi ncat Cardiovascular: s1 s 2 tachy Respiratory:  clea rno added soudn Abdomen:  Soft nt slight distension, tender in RLQ Skin no le edema Neuro intact  Data Reviewed: Basic Metabolic Panel:  Recent Labs Lab 08/23/15 0506  08/24/15 0510 08/24/15 1520 08/25/15 0436 08/25/15 1515 08/26/15 0433 08/26/15 2024 08/27/15 0515  NA 135  < > 134* 132* 132* 132* 134* 132* 131*  K 3.8  < > 4.3 4.1 3.9 4.1 3.9 4.7 4.4  CL  96*  < > 100* 98* 98* 98* 99* 99* 98*  CO2 28  < > 26 25 27 26 27 26 26   GLUCOSE 181*  < > 228* 233* 158* 311* 122* 259* 140*  BUN 10  < > 8 8 8 10 8 9 11   CREATININE 2.05*  < > 1.53* 1.49* 1.38* 1.33* 1.23* 2.06* 2.73*  CALCIUM 7.3*  < > 7.6* 7.2* 7.2* 7.2* 7.6* 7.9* 7.5*  MG 1.9  --  2.2  --  2.2  --  2.3  --   --   PHOS 2.2*  < > 1.8* 3.5 1.9* 3.6 2.0* 1.6*  --   < > = values in this interval not displayed. Liver Function Tests:  Recent Labs Lab 08/24/15 1520 08/25/15 0436 08/25/15 1515 08/26/15 0433 08/26/15 2024  ALBUMIN 1.4* 1.4* 1.4* 1.5* 1.5*   No results for input(s): LIPASE, AMYLASE in the last 168 hours. No results for input(s): AMMONIA in the last 168 hours. CBC:  Recent Labs Lab 08/24/15 0510  08/26/15 0433 08/27/15 0515 08/28/15 0257 08/28/15 1413 08/29/15 0318  WBC 17.8*  < > 13.7* 17.0* 15.0* 22.5* 12.2*  NEUTROABS 16.4*  --   --   --   --  19.4*  --   HGB 9.3*  < >  8.4* 8.6* 7.4* 7.2* 8.8*  HCT 30.2*  < > 26.9* 27.8* 23.4* 23.0* 27.5*  MCV 93.5  < > 94.7 93.9 94.0 93.1 92.9  PLT 111*  < > 121* 144* 131* 175 158  < > = values in this interval not displayed. Cardiac Enzymes: No results for input(s): CKTOTAL, CKMB, CKMBINDEX, TROPONINI in the last 168 hours. BNP: Invalid input(s): POCBNP CBG:  Recent Labs Lab 08/28/15 1618 08/28/15 2233 08/29/15 0356 08/29/15 0731 08/29/15 1149  GLUCAP 209* 162* 389* 257* 261*    Recent Results (from the past 240 hour(s))  Culture, blood (routine x 2)     Status: None   Collection Time: 08/21/15  7:50 AM  Result Value Ref Range Status   Specimen Description BLOOD HAND RIGHT  Final   Special Requests BOTTLES DRAWN AEROBIC AND ANAEROBIC 5CC  Final   Culture NO GROWTH 5 DAYS  Final   Report Status 08/26/2015 FINAL  Final  Culture, blood (routine x 2)     Status: None   Collection Time: 08/21/15  8:00 AM  Result Value Ref Range Status   Specimen Description BLOOD HAND RIGHT  Final   Special Requests BOTTLES  DRAWN AEROBIC ONLY 10CC  Final   Culture NO GROWTH 5 DAYS  Final   Report Status 08/26/2015 FINAL  Final  MRSA PCR Screening     Status: None   Collection Time: 08/22/15  3:26 PM  Result Value Ref Range Status   MRSA by PCR NEGATIVE NEGATIVE Final    Comment:        The GeneXpert MRSA Assay (FDA approved for NASAL specimens only), is one component of a comprehensive MRSA colonization surveillance program. It is not intended to diagnose MRSA infection nor to guide or monitor treatment for MRSA infections.      Studies:              All Imaging reviewed and is as per above notation   Scheduled Meds: . sodium chloride   Intravenous Once  . carvedilol  10 mg Oral Daily  . darbepoetin (ARANESP) injection - DIALYSIS  150 mcg Intravenous Q Tue-HD  . doxercalciferol  1 mcg Intravenous Q T,Th,Sa-HD  . feeding supplement  1 Container Oral TID BM  . insulin aspart  0-15 Units Subcutaneous TID WC & HS  . insulin glargine  10 Units Subcutaneous QHS  . levothyroxine  250 mcg Oral QAC breakfast  . midodrine  10 mg Oral TID WC  . multivitamin  1 tablet Oral QHS  . pneumococcal 23 valent vaccine  0.5 mL Intramuscular Tomorrow-1000  . pravastatin  40 mg Oral q1800  . sodium chloride  3 mL Intravenous Q12H  . sodium phosphate  Dextrose 5% IVPB  30 mmol Intravenous Once   Continuous Infusions:     Assessment/Plan:  1. Necr enterocolitis-appreciate surg input.   Poor overall surgical candidate.  would not re-image at present.  Appreciate Palliative input palliative care to discuss with patient and family goals of care.  trransfused 10/14 and Hb now 8.8.  Keep on clear liquids today and try full liquids tomorrow 2. ESRD TTS[facility].  dialysing 10/14.  Continue Midrodrine 10 tid for BP support. TDC for removal.  Dry wght 65kg 3.       Afib + RVR, CHad2-Vasc2~4 [8%/yr risk] Cards s/o 10/11-continue prn metoprolol-added Coreg 10 XL in am.   Currently rate is controlled below 120.   Coumadin held 2/2 ? risk GIB-INR 1.9. Would no t Anticoagulate  as hi risk for bleeding-will discuss with family relative stroke risk 4. DM ty ii-change to Q 4 hr coverage, continue lantus 10.  CBG's are 250's. 5. Hypothyroidism Levothyroxine 250 mcg.  Large dose.  Close recheck TFT's as op 6. Mod-severe MAR/PAH-medical management-not candidate operative 7. AOCD/Renal disease-Aranesp q tues.  See above. Poor surgical candidate.  8. 2/2 hypoparathyroidism-cont doxercalcif 1 mcg tts.  Per renal 9. hld-cont prvachol 40 daily 10. Met encephalopathy family states that this is secondary to blood sugar fluctuations.      Code Status: Partial Code Family Communication: Discussed with daughter extensively as per my conversation 10/15 Disposition Plan:  skilled facility placement vs hopsice?--discussions ongoing DVT prophylaxis: SCD Consultants: renal  Verneita Griffes, MD  [facility] Pager [phone] 08/29/2015, 3:12 PM    LOS: 11 days

## 2015-08-29 NOTE — Progress Notes (Signed)
Rec'd pt from dialysis via bed. Placed back on bedside monitor . VS obtained. CBG obtained. HS medications administered and [place] sandwich and applesauce provided.

## 2015-08-29 NOTE — Progress Notes (Signed)
Subjective: Loose BM w probable blood yesterday - transfused in HD Pt does not complain of abd pain Palliative care, daughter & other family at bedside  Objective: Vital signs in last 24 hours: Temp:  [98.2 F (36.8 C)-100.3 F (37.9 C)] 99.6 F (37.6 C) (10/15 0426) Pulse Rate:  [98-127] 115 (10/15 0200) Resp:  [19-27] 23 (10/15 0200) BP: (93-140)/(49-71) 118/49 mmHg (10/15 0200) SpO2:  [97 %-100 %] 100 % (10/15 0200) Weight:  [70.6 kg (155 lb 10.3 oz)-77.565 kg (171 lb)] 77.565 kg (171 lb) (10/15 0426) Last BM Date: 08/28/15  Intake/Output from previous day: 10/14 0701 - 10/15 0700 In: 982.8 [P.O.:477; I.V.:170.8; Blood:335] Out: 1905  Intake/Output this shift:    General: Pt awake/alert/oriented x4 in no major acute distress Eyes: PERRL, normal EOM. Sclera nonicteric Neuro: CN II-XII intact w/o focal sensory/motor deficits. Lymph: No head/neck/groin lymphadenopathy Psych:  No delerium/psychosis/paranoia.  Quiet, guarded HENT: Normocephalic, Mucus membranes moist.  No thrush Neck: Supple, No tracheal deviation Chest: No pain.  Good respiratory excursion. CV:  Pulses intact.  HR 100-105 Regular rhythm MS: Normal AROM mjr joints.  No obvious deformity Abdomen: Soft, Nondistended.  Mildly form R side but again denies pain.  No RT/guarding peritonitis.  No incarcerated hernias. Ext:  SCDs BLE.  No significant edema.  No cyanosis Skin: No petechiae / purpura   Lab Results:   Recent Labs  08/28/15 1413 08/29/15 0318  WBC 22.5* 12.2*  HGB 7.2* 8.8*  HCT 23.0* 27.5*  PLT 175 158   BMET  Recent Labs  08/26/15 2024 08/27/15 0515  NA 132* 131*  K 4.7 4.4  CL 99* 98*  CO2 26 26  GLUCOSE 259* 140*  BUN 9 11  CREATININE 2.06* 2.73*  CALCIUM 7.9* 7.5*   PT/INR  Recent Labs  08/27/15 0515  LABPROT 21.8*  INR 1.91*   ABG No results for input(s): PHART, HCO3 in the last 72 hours.  Invalid input(s): PCO2, PO2  Studies/Results: No results  found.  Anti-infectives: Anti-infectives    Start     Dose/Rate Route Frequency Ordered Stop   08/23/15 1800  vancomycin (VANCOCIN) IVPB 750 mg/150 ml premix  Status:  Discontinued     750 mg 150 mL/hr over 60 Minutes Intravenous Every 24 hours 08/22/15 1419 08/22/15 1422   08/23/15 1300  anidulafungin (ERAXIS) 100 mg in sodium chloride 0.9 % 100 mL IVPB  Status:  Discontinued     100 mg over 90 Minutes Intravenous Every 24 hours 08/22/15 1236 08/24/15 1001   08/23/15 0800  vancomycin (VANCOCIN) IVPB 750 mg/150 ml premix  Status:  Discontinued     750 mg 150 mL/hr over 60 Minutes Intravenous Every 24 hours 08/22/15 1422 08/24/15 1331   08/22/15 1600  piperacillin-tazobactam (ZOSYN) IVPB 2.25 g  Status:  Discontinued     2.25 g 100 mL/hr over 30 Minutes Intravenous Every 6 hours 08/22/15 1419 08/25/15 0955   08/22/15 1300  anidulafungin (ERAXIS) 200 mg in sodium chloride 0.9 % 200 mL IVPB     200 mg over 180 Minutes Intravenous  Once 08/22/15 1234 08/22/15 2029   08/22/15 1215  anidulafungin (ERAXIS) 200 mg in sodium chloride 0.9 % 200 mL IVPB  Status:  Discontinued     200 mg over 180 Minutes Intravenous Every 24 hours 08/22/15 1208 08/22/15 1234   08/22/15 1200  vancomycin (VANCOCIN) IVPB 750 mg/150 ml premix  Status:  Discontinued     750 mg 150 mL/hr over 60 Minutes Intravenous Every T-Th-Sa (  Hemodialysis) 08/21/15 0602 08/22/15 1407   08/21/15 0615  vancomycin (VANCOCIN) 1,500 mg in sodium chloride 0.9 % 500 mL IVPB     1,500 mg 250 mL/hr over 120 Minutes Intravenous  Once 08/21/15 0602 08/21/15 0844   08/19/15 0600  piperacillin-tazobactam (ZOSYN) IVPB 2.25 g  Status:  Discontinued    Comments:  Zosyn 2.25 g IV q8h in ESRD   2.25 g 100 mL/hr over 30 Minutes Intravenous 3 times per day 08/19/15 0539 08/22/15 1407      Assessment/Plan: Abdominal pain/right colon pneumatosis c/w ischemic colitis- CT revealed pneumatosis, colonic thickening.  WBC improved this AM but  recurrent hematochezia & tachycardia & worsening limb pain concerning  Exam OK but I suspect she is underplaying pain & masked w DM.   I am concerned that she is not resolving w nonop management.  Retry NPO & liquids only  She would not do well with colectomy and ileostomy long term-not safe for anastomosis in this condition as leak rate would be very high.  Fluid losses a challenge as well.  Long term risk of stricture/obstruction even if resolves from infection   Family d/w palliative medicine whom is at bedside.  Pt & family want NO SURGERY even if nonop management leads to her death.  That is reasonable given her NUMEROUS comorbidities of poorly controlled DM, ESRD, AFib, ASCVD w limb pain & expected poor QOL postop even if surveived.  OR risks high.   WILL SIGN OFF.  Call w questions   Rayman Petrosian C. 08/29/2015

## 2015-08-29 NOTE — Progress Notes (Signed)
Einar Pheasantom Calahan made aware of CBG 386. This reflects meal she had eaten at 2245 and the 2 boosts she had throughout the night. New orders received.

## 2015-08-29 NOTE — Consult Note (Signed)
Consultation Note Date: 08/29/2015   Patient Name: Megan Vasquez  DOB: 1940-12-07  MRN: [id number]  Age / Sex: 74 y.o., female   PCP: Glendale Chard, MD Referring Physician: Nita Sells, MD  Reason for Consultation: Establishing goals of care  Palliative Care Assessment and Plan Summary of Established Goals of Care and Medical Treatment Preferences   Clinical Assessment/Narrative: Pt is a 74 yo female extensive PMH: CAD with CABG, CVA, DM, ESRD on HD, PAD, HTN, HLD, a-fib in RVR, now admitted after period of feeling unwell, decreased PO intake as well as decreased LOC. She was found to be in a-fib with RVR. Pt also c/o abd pain and abdomen very tender to palpation on exam. CT of abd shows ischemia, pneumatosis. Pt had a drop in hgb and was transfused 1 unit and hgb now 8.8 up from 7.2. She is more alert today per family but far from her baseline. She is tolerating a clear liquid diet well, but belly still very tender to palpation. She also is c/o right heel pain. Her right heel is discolored and tissue mushy. Met with dtr Tammy Sours, her partner Jettie Booze and pt's grandson with whom she lives, [place]. Pt has another daughter who lives in ALF in [place] Clarene Critchley is HCPOA. Spoke at length about pt's fragile state as well as PMH and how this impacts her options to address bowel ischemia as well as tolerating aggressive resuscitative efforts. Daughter is struggling with decisions as her mother as told her in the past to " do everything even life support" but sees how her mother is declining. I did share that I felt the time was rapidly coming where Clarene Critchley will have to help her mother make informed health care decisions. She acknowledged this. Pt did readily state no surgery even in an emergent situation in order to save her life. I spoke to [place] and [place] privately later more in depth about code status. Clarene Critchley has had to make the decision to remove her brother and Aunt from life support.  She does not want that for her mother bit also wants to feel as though she has given her every chance. At this point, partial code discussed , to treat everything we can but stop at ventilation if her condition where to worsen overnight. Clarene Critchley did agree to this.Family receptive to further conversations with Palliative Medicine as this goes forward.  Contacts/Participants in Discussion: Primary Decision Maker: Daughter but family still trying to include pt in decision making.   HCPOA: yes  Met with pt, daughter Ramon Dredge, her spouse, Jettie Booze and grandson [name]   Code Status/Advance Care Planning:  Partial. All interventions except ventilation  Would want further antibiotics  Would want further transfusions  Continue HD  No surgery even in an emergent situation to save her life  Symptom Management:   Pain: Pt reporting abd pain. Very reluctant to take RX. Cont as directed by attending. Palliative happy to help if pain worsens and or if goals change  Palliative Prophylaxis: Bowel regimen  Additional Recommendations (Limitations, Scope, Preferences):  SNF for deconditioning. Pt very adverse to hospice discussion Psycho-social/Spiritual:   Support System: yes  Desire for further Chaplaincy support:no  Prognosis: Unable to determine . Pt very high risk for an acute event 2/2 to her compromised vascular system, bowel ischemia, a-fib and unable to take anti-coagulation at this point because of bleeding concern. Discussed with family Discharge Planning:  [place] for rehab with Palliative care service follow-up  Chief Complaint/History of Present Illness: Pt is a 74 yo female with extensive medical history admitted after feeling unwell, decreased PO intake, decreased LOC, lethargy. She was found to be in a-fib with RVR. Pt also c/o abd pain and CT scan revealed areas of bowel with inflammation concerning for ischemia  Primary Diagnoses  Present on  Admission:  . HTN (hypertension) . RLQ abdominal pain . Atrial fibrillation with RVR ([place]) . HLD (hyperlipidemia) . Coronary artery disease . Severe mitral regurgitation . Thrombocytopenia ([place]) . Anemia, chronic renal failure . Hypothyroidism . Metabolic encephalopathy . Sepsis ([place]) . PAD (peripheral artery disease) (H[facility])  Palliative Review of Systems: Denies abd pain at rest, no n/v, or dyspnea I have reviewed the medical record, interviewed the patient and family, and examined the patient. The following aspects are pertinent.  Past Medical History  Diagnosis Date  . Diabetes mellitus without complication ([place])   . Hypertension   . Hyperlipidemia   . Atrial fibrillation ([place])   . ESRD (end stage renal disease) ([place])     HD since 06/2000  . CAD (coronary artery disease)   . 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 History of nuclear stress test 10/2012    lexiscan; normal stress test; post-stress EF72%  . Peripheral arterial disease ([place])   . Osteoporosis   . Mitral regurgitation     severe by 2-D echocardiogram performed 10/22/14   Social History   Social History  . Marital Status: Divorced    Spouse Name: N/A  . Number of Children: 3  . Years of Education: 56   Social History Main Topics  . Smoking status: Former Smoker    Quit date: 06/10/2009  . Smokeless tobacco: Never Used  . Alcohol Use: No  . Drug Use: No  . Sexual Activity: Not Asked   Other Topics Concern  . None   Social History Narrative   Family History  Problem Relation Age of Onset  . Heart disease Mother   . Heart attack Brother   . Breast cancer Sister   . Diabetes Sister   . Heart attack Sister   . Diabetes Child    Scheduled Meds: . sodium chloride   Intravenous Once  . carvedilol  10 mg Oral Daily  . darbepoetin (ARANESP) injection - DIALYSIS  150 mcg  Intravenous Q Tue-HD  . doxercalciferol  1 mcg Intravenous Q T,Th,Sa-HD  . feeding supplement  1 Container Oral TID BM  . insulin aspart  0-15 Units Subcutaneous TID WC & HS  . insulin glargine  10 Units Subcutaneous QHS  . levothyroxine  250 mcg Oral QAC breakfast  . midodrine  10 mg Oral TID WC  . multivitamin  1 tablet Oral QHS  . pneumococcal 23 valent vaccine  0.5 mL Intramuscular Tomorrow-1000  . pravastatin  40 mg Oral q1800  . sodium chloride  3 mL Intravenous Q12H  . sodium phosphate  Dextrose 5% IVPB  30 mmol Intravenous Once   Continuous Infusions:  PRN Meds:.sodium chloride, acetaminophen, metoprolol, ondansetron **OR** ondansetron (ZOFRAN) IV, sodium chloride, sodium chloride Medications Prior to Admission:  Prior to Admission medications   Medication Sig Start Date End Date Taking? Authorizing Provider  calcium acetate (PHOSLO) 667 MG capsule Take 1,334 mg by mouth 3 (three) times daily with meals.  12/21/13  Yes Historical Provider, MD  diltiazem (CARDIZEM CD) 240 MG 24 hr capsule TAKE ONE CAPSULE BY MOUTH DAILY 05/19/15  Yes Lorretta Harp, MD  insulin glargine (LANTUS) 100 UNIT/ML injection Inject 10 Units into the skin 2 (two) times daily. 10 units twice a day   Yes Historical Provider, MD  levothyroxine (SYNTHROID, LEVOTHROID) 200 MCG tablet Take 200 mcg by mouth daily before breakfast. Take with 51mg to equal total dose of 2577m   Yes Historical Provider, MD  levothyroxine (SYNTHROID, LEVOTHROID) 50 MCG tablet Take 50 mcg by mouth daily. Take  With 20030mto equal 250m10m2/1/15  Yes Historical Provider, MD  metoprolol succinate (TOPROL-XL) 50 MG 24 hr tablet Take 1/2 tablet (25mg70m mouth daily. Patient taking differently: Take 25 mg by mouth daily.  11/17/14  Yes JonatLorretta Harp pravastatin (PRAVACHOL) 40 MG tablet Take 40 mg by mouth every morning.    Yes Historical Provider, MD  warfarin (COUMADIN) 3 MG tablet Take 4.5-6 mg by mouth daily. Take 2 tablets (6mg) 46m Monday, and 1 1/2 (4.5MG) tablet all other days   Yes Historical Provider, MD  acetaminophen (TYLENOL) 325 MG tablet Take 325 mg by mouth every 6 (six) hours as needed for moderate pain.    Historical Provider, MD  Calcium Carbonate (CALCIUM 600 PO) Take 1 tablet by mouth 3 (three) times daily.     Historical Provider, MD  diltiazem (CARDIZEM CD) 240 MG 24 hr capsule Take 240 mg by mouth daily.    Historical Provider, MD  lidocaine-prilocaine (EMLA) cream Apply 1 application topically Every Tuesday,Thursday,and Saturday with dialysis.    Historical Provider, MD  metoprolol succinate (TOPROL-XL) 50 MG 24 hr tablet Take 25 mg by mouth daily. Take with or immediately following a meal.    Historical Provider, MD  OVER TAdenalication topically Every Tuesday,Thursday,and Saturday with dialysis. Diabetic foot cream used for HD    Historical Provider, MD  warfarin (COUMADIN) 3 MG tablet Take 1.5 to 2 tablets by mouth daily as directed by coumadin clinic Patient not taking: Reported on 09/08/2015 01/05/15   ThomasTroy Sinewarfarin (COUMADIN) 3 MG tablet TAKE 1.5 TO 2 TABLETS BY MOUTH DAILY AS DIRECTED BY COUMADIN CLINIC Patient not taking: Reported on 08/26/2015 08/17/15   JonathLorretta Harp No Known Allergies CBC:    Component Value Date/Time   WBC 12.2* 08/29/2015 0318   HGB 8.8* 08/29/2015 0318   HCT 27.5* 08/29/2015 0318   PLT 158 08/29/2015 0318   MCV 92.9 08/29/2015 0318   NEUTROABS 19.4* 08/28/2015 1413   LYMPHSABS 1.2 08/28/2015 1413   MONOABS 1.6* 08/28/2015 1413   EOSABS 0.3 08/28/2015 1413   BASOSABS 0.0 08/28/2015 1413   Comprehensive Metabolic Panel:    Component Value Date/Time   NA 131* 08/27/2015 0515   K 4.4 08/27/2015 0515   CL 98* 08/27/2015 0515   CO2 26 08/27/2015 0515   BUN 11 08/27/2015 0515   CREATININE 2.73* 08/27/2015 0515   GLUCOSE 140* 08/27/2015 0515   CALCIUM 7.5* 08/27/2015 0515   CALCIUM 8.4 02/24/2010 1106   AST 33  08/22/2015 0148   ALT 22 08/22/2015 0148   ALKPHOS 79 08/22/2015 0148   BILITOT 0.9 08/22/2015 0148   PROT 5.4* 08/22/2015 0148   ALBUMIN 1.5* 08/26/2015 2024    Physical Exam: Vital Signs: BP 118/49 mmHg  Pulse 115  Temp(Src) 99.6 F (37.6 C) (Oral)  Resp 23  Ht _0  (1.702 m)  Wt 77.565 kg (171 lb)  BMI 26.78 kg/m2  SpO2 100% SpO2: SpO2: 100 % O2 Device: O2 Device: Nasal Cannula O2 Flow Rate: O2 Flow Rate (L/min): 2 L/min Intake/output summary:  Intake/Output Summary (Last 24 hours) at 08/29/15 1017 Last data filed at 08/28/15 2246  Gross per 24 hour  Intake 862.75 ml  Output   1905 ml  Net -1042.25 ml   LBM: Last BM Date: 08/28/15 Baseline Weight: Weight: 64.3 kg (141 lb 12.1 oz) Most recent weight: Weight: 77.565 kg (171 lb)  Exam Findings:  Gen: Chronically ill appearing older female  Resp: No work of breathing Cardiac: a-fib, rate 100, JVD GI: TTP especially RLQ,  Musculoskeletal; Right hell painful to touch. Area dark, tissue mushy         Palliative Performance Scale: 40-50%              Additional Data Reviewed: Recent Labs     08/26/15  2024  08/27/15  0515   08/28/15  1413  08/29/15  0318  WBC   --   17.0*   < >  22.5*  12.2*  HGB   --   8.6*   < >  7.2*  8.8*  PLT   --   144*   < >  175  158  NA  132*  131*   --    --    --   BUN  9  11   --    --    --   CREATININE  2.06*  2.73*   --    --    --    < > = values in this interval not displayed.     Time In: 0830 Time Out: 1000 Time Total: 90 min Greater than 50%  of this time was spent counseling and coordinating care related to the above assessment and plan. Staffed with Dr. Verlon Au. Palliative Medicine to continue to follow and assist family with goals   Signed by: Dory Horn, NP  Dory Horn, NP  08/29/2015, 10:17 AM  Please contact Palliative Medicine Team phone at [phone] for questions and concerns.

## 2015-08-30 LAB — ALBUMIN: Albumin: 1.4 g/dL — ABNORMAL LOW (ref 3.5–5.0)

## 2015-08-30 LAB — BASIC METABOLIC PANEL
Anion gap: 9 (ref 5–15)
BUN: 22 mg/dL — ABNORMAL HIGH (ref 6–20)
CALCIUM: 7.8 mg/dL — AB (ref 8.9–10.3)
CHLORIDE: 96 mmol/L — AB (ref 101–111)
CO2: 27 mmol/L (ref 22–32)
Creatinine, Ser: 3.86 mg/dL — ABNORMAL HIGH (ref 0.44–1.00)
GFR calc Af Amer: 12 mL/min — ABNORMAL LOW (ref >60)
GFR, EST NON AFRICAN AMERICAN: 10 mL/min — AB (ref >60)
GLUCOSE: 127 mg/dL — AB (ref 65–99)
POTASSIUM: 4.6 mmol/L (ref 3.5–5.1)
Sodium: 132 mmol/L — ABNORMAL LOW (ref 135–145)

## 2015-08-30 LAB — GLUCOSE, CAPILLARY
GLUCOSE-CAPILLARY: 131 mg/dL — AB (ref 65–99)
GLUCOSE-CAPILLARY: 147 mg/dL — AB (ref 65–99)
Glucose-Capillary: 41 mg/dL — CL (ref 65–99)
Glucose-Capillary: 65 mg/dL (ref 65–99)
Glucose-Capillary: 110 mg/dL — ABNORMAL HIGH (ref 65–99)
Glucose-Capillary: 162 mg/dL — ABNORMAL HIGH (ref 65–99)

## 2015-08-30 LAB — CBC
HEMATOCRIT: 28.5 % — AB (ref 36.0–46.0)
HEMOGLOBIN: 9.2 g/dL — AB (ref 12.0–15.0)
MCH: 29.4 pg (ref 26.0–34.0)
MCHC: 32.3 g/dL (ref 30.0–36.0)
MCV: 91.1 fL (ref 78.0–100.0)
Platelets: 169 10*3/uL (ref 150–400)
RBC: 3.13 MIL/uL — ABNORMAL LOW (ref 3.87–5.11)
RDW: 16.1 % — ABNORMAL HIGH (ref 11.5–15.5)
WBC: 13.4 10*3/uL — ABNORMAL HIGH (ref 4.0–10.5)

## 2015-08-30 LAB — PHOSPHORUS: Phosphorus: 2.8 mg/dL (ref 2.5–4.6)

## 2015-08-30 MED ORDER — INSULIN GLARGINE 100 UNIT/ML ~~LOC~~ SOLN
2.0000 [IU] | Freq: Every day | SUBCUTANEOUS | Status: DC
  Administered 2015-08-30 – 2015-08-31 (×2): 2 [IU] via SUBCUTANEOUS
  Filled 2015-08-30 (×3): qty 0.02

## 2015-08-30 MED ORDER — INSULIN ASPART 100 UNIT/ML ~~LOC~~ SOLN
0.0000 [IU] | SUBCUTANEOUS | Status: DC
  Administered 2015-08-30: 2 [IU] via SUBCUTANEOUS
  Administered 2015-08-31 (×2): 3 [IU] via SUBCUTANEOUS
  Administered 2015-09-01 (×2): 2 [IU] via SUBCUTANEOUS
  Administered 2015-09-01: 5 [IU] via SUBCUTANEOUS
  Administered 2015-09-02: 3 [IU] via SUBCUTANEOUS
  Administered 2015-09-02: 2 [IU] via SUBCUTANEOUS

## 2015-08-30 NOTE — Progress Notes (Signed)
Admit: 09/03/2015 LOS: 12  10F ESRD [facility] via AVF w/ pulmonary edema/hypervolemia, colonic pneumatosis likeyl 2/2 ischemia, AFib, known ASCVD.   Subjective:  Says no belly pain but distended and tender on exam- soft spoken WBC stable- having low grade temps Conversations with pall care ongoing- partial code- no intubation and no surgery    10/15 0701 - 10/16 0700 In: 933 [P.O.:930; I.V.:3] Out: -   Filed Weights   08/28/15 2109 08/29/15 0426 08/30/15 0540  Weight: 70.6 kg (155 lb 10.3 oz) 77.565 kg (171 lb) 70.4 kg (155 lb 3.3 oz)    Scheduled Meds: . sodium chloride   Intravenous Once  . carvedilol  10 mg Oral Daily  . darbepoetin (ARANESP) injection - DIALYSIS  150 mcg Intravenous Q Tue-HD  . doxercalciferol  1 mcg Intravenous Q T,Th,Sa-HD  . feeding supplement  1 Container Oral TID BM  . insulin aspart  0-15 Units Subcutaneous TID WC  . insulin glargine  10 Units Subcutaneous QHS  . levothyroxine  250 mcg Oral QAC breakfast  . midodrine  10 mg Oral TID WC  . multivitamin  1 tablet Oral QHS  . pneumococcal 23 valent vaccine  0.5 mL Intramuscular Tomorrow-1000  . pravastatin  40 mg Oral q1800  . sodium chloride  3 mL Intravenous Q12H  . sodium phosphate  Dextrose 5% IVPB  30 mmol Intravenous Once   Continuous Infusions:   PRN Meds:.sodium chloride, acetaminophen, metoprolol, ondansetron **OR** ondansetron (ZOFRAN) IV, sodium chloride, sodium chloride  Current Labs: reviewed   Dialysis Orders: TTS [facility] 4 hrs 65.5kgs 2K/2Ca L AVF 1000u hep hectorol 2 venofor 100 q HD until 10/8 then 50 q week Micera 100 q2 weeks - to start 10/6  Physical Exam:  Blood pressure 132/75, pulse 101, temperature 99.5 F (37.5 C), temperature source Oral, resp. rate 33, height 5\' 7"  (1.702 m), weight 70.4 kg (155 lb 3.3 oz), SpO2 99 %. Chronically ill appearing abd w/ diffuse TTP no R/G IRIR, tachy, no rub CTAB LUE AVF + B/T Pitting edema to dependent areas    A/P 1. ESRD 1. Off CRRT since 10/12, BP improved 2.  IHD done Friday off schedule- will plan for next on Monday (m, thurs, sat next week?) 2. Outpt LUE AVF- now using 3. Hypotension  1. Improved today, no pressors- put back on coreg ( coreg and midodrine ?) 2. Cont with mild RVR   3. On midodrine- given sodium is likely volume overloaded and that is problem 4. ?colonic ischemia -- CCS following, better- no abx - palliative care has talked to family- no surgery will be done 5. Permanent AFib: Cardiology has followed 6. ASCVD/hx/o CABG 7. Anemia, stable, monitor 1. ESA 150mc ASranesp qTues- given one unit PRBC on 10/14  8. Hypophos- given repletion Friday- also on hectorol   Dispo- I agree this is likely to happen again- and is not over this episode yet with albumin in the 1's - poor prognosis- will need to be able to sit for HD on Monday in order for OP dialysis to continue- if she is unable to do this may be another talking point for possible comfort care moving forward.  Working  toward a NCB status with palliative care  Megan Vasquez A   08/30/2015, 8:55 AM   Recent Labs Lab 08/26/15 0433 08/26/15 2024 08/27/15 0515 08/30/15 0327  NA 134* 132* 131* 132*  K 3.9 4.7 4.4 4.6  CL 99* 99* 98* 96*  CO2 27 26 26  27  GLUCOSE 122* 259* 140* 127*  BUN 22*  CREATININE 1.23* 2.06* 2.73* 3.86*  CALCIUM 7.6* 7.9* 7.5* 7.8*  PHOS 2.0* 1.6*  --  2.8    Recent Labs Lab 08/24/15 0510  08/28/15 1413 08/29/15 0318 08/30/15 0327  WBC 17.8*  < > 22.5* 12.2* 13.4*  NEUTROABS 16.4*  --  19.4*  --   --   HGB 9.3*  < > 7.2* 8.8* 9.2*  HCT 30.2*  < > 23.0* 27.5* 28.5*  MCV 93.5  < > 93.1 92.9 91.1  PLT 111*  < > 175 158 169  < > = values in this interval not displayed.

## 2015-08-30 NOTE — Progress Notes (Signed)
When I was feeding the patient, she seemed to be having a congested cough. I listened to her lungs and she had fine crackles in her lower lobes. Her sats are in the 90s on RA. I notified Dr. Mahala MenghiniSamtani and Dr. Hyman HopesWebb. Pt not in any distress but Dr. Hyman HopesWebb instructed for charge nurse to call dialysis at 0530 to have them schedule her first thing. If pt gets into any distress, we will notify MD immediately. Family at bedside and updated and were satisfied. Will continue to monitor and keep pt on continuous pulse ox.

## 2015-08-30 NOTE — Progress Notes (Signed)
Daily Progress Note   Patient Name: Megan Vasquez       Date: 08/30/2015 DOB: 12/23/1940  Age: 74 y.o. MRN#: [id number] Attending Physician: Rhetta MuraJai-Gurmukh Samtani, MD Primary Care Physician: Gwynneth AlimentSANDERS,ROBYN N, MD Admit Date: 08/25/2015  Reason for Consultation/Follow-up: Establishing goals of care  Subjective: Pt appears weaker, less conversant. Only able to sit on side of bed briefly with support. Could not assist in turning in the bed. Eating clear liquid diet. Abd soft but TTP, bowel sounds high pitched. Nephrology note reviewed and discussed concerns with pt's daughter Aggie Cosierheresa and her spouse Denny Peonrin. Pt very weak and now concerned that unless she can sit up for 3-4 hours that she can no longer do community HD. Daughter asked if this can continue to be provided in the hospital or SNF and I informed her no. I did state that given her fragile condition, new bowel ischemia, with associated worsening weakness that she may not be able to continue on HD and recommended considering hospice. Daughter politely listened. She is overwhelmed with her mother's decline, stated " she's a fighter".  She is still taking a liquid diet. No n/v Interval Events: Increased weakness Length of Stay: 12 days  Current Medications: Scheduled Meds:  . sodium chloride   Intravenous Once  . carvedilol  10 mg Oral Daily  . darbepoetin (ARANESP) injection - DIALYSIS  150 mcg Intravenous Q Tue-HD  . doxercalciferol  1 mcg Intravenous Q T,Th,Sa-HD  . feeding supplement  1 Container Oral TID BM  . insulin aspart  0-15 Units Subcutaneous TID WC  . insulin glargine  10 Units Subcutaneous QHS  . levothyroxine  250 mcg Oral QAC breakfast  . midodrine  10 mg Oral TID WC  . multivitamin  1 tablet Oral QHS  . pneumococcal 23 valent vaccine  0.5 mL Intramuscular Tomorrow-1000  . pravastatin  40 mg Oral q1800  . sodium chloride  3 mL Intravenous Q12H  . sodium phosphate  Dextrose 5% IVPB  30 mmol Intravenous Once     Continuous Infusions:    PRN Meds: sodium chloride, acetaminophen, metoprolol, ondansetron **OR** ondansetron (ZOFRAN) IV, sodium chloride, sodium chloride  Palliative Performance Scale: 30%     Vital Signs: BP 131/64 mmHg  Pulse 104  Temp(Src) 98.7 F (37.1 C) (Oral)  Resp 30  Ht 5\' 7"  (1.702 m)  Wt 70.4 kg (155 lb 3.3 oz)  BMI 24.30 kg/m2  SpO2 95% SpO2: SpO2: 95 % O2 Device: O2 Device: Not Delivered O2 Flow Rate: O2 Flow Rate (L/min): 2 L/min  Intake/output summary:  Intake/Output Summary (Last 24 hours) at 08/30/15 1516 Last data filed at 08/30/15 1402  Gross per 24 hour  Intake    573 ml  Output      0 ml  Net    573 ml   LBM:   Baseline Weight: Weight: 64.3 kg (141 lb 12.1 oz) Most recent weight: Weight: 70.4 kg (155 lb 3.3 oz)  Physical Exam: General: Older woman, somnolent Resp: Mild increased work of breathing Cardiac: Tachy and irrg              Additional Data Reviewed: Recent Labs     08/29/15  0318  08/30/15  0327  WBC  12.2*  13.4*  HGB  8.8*  9.2*  PLT  158  169  NA   --   132*  BUN   --   22*  CREATININE   --   3.86*     Problem List:  Patient Active Problem List   Diagnosis Date Noted  . Palliative care encounter   . Hypotension   . Ischemia, bowel (HCC)   . Pneumatosis coli   . Acute respiratory failure with hypoxia (HCC)   . Hemodialysis catheter dysfunction (HCC)   . Sepsis (HCC) 08/21/2015  . Fever, unspecified 08/21/2015  . Leukocytosis 08/21/2015  . PAD (peripheral artery disease) (HCC) 08/21/2015  . Right foot pain 08/21/2015  . RLQ abdominal pain 09/08/2015  . Thrombocytopenia (HCC) 08/30/2015  . Anemia, chronic renal failure 08/15/2015  . Hypothyroidism 09/07/2015  . Metabolic encephalopathy 09/04/2015  . Severe mitral regurgitation 10/15/2014  . Coronary artery disease 10/18/2013  . Peripheral arterial disease (HCC) 10/18/2013  . Diabetes mellitus, insulin dependent (IDDM), controlled (HCC) 06/10/2013  .  HTN (hypertension) 06/10/2013  . CKD (chronic kidney disease) stage V requiring chronic dialysis (HCC) 06/10/2013  . HLD (hyperlipidemia) 06/10/2013  . Atrial fibrillation with RVR (HCC) 02/01/2013  . Long term current use of anticoagulant therapy 02/01/2013     Palliative Care Assessment & Plan    Code Status:  Limited code. No intubation  Goals of Care:  No surgery for bowel ischemia  No other changes to pt goals at this point. Daughter struggling with decline in light of new bowel ischemia but sounded very worried that there is a possibility pt may not be able to have HD anymore  Symptom Management:  Pain: Pt still seems resistant to pain medication. She denies that she is having abd pain but grimaces with touch. Cont with tylenol for now but low dose opioid likely needed if pt and daughter become agreeable. Recommend oxycodone or dilaudid   Dyspnea; Pt having mild increased work of breathing this am. She denies. . Cont 02 for now and monitor for need for low dose opioids as condition declines  Palliative Prophylaxis:  Bowel protocol  Psycho-social/Spiritual:  Desire for further Chaplaincy support:no   Prognosis: Pt appears acutely ill. Prognosis in flux especially if she cannot cont HD. Then her prognosis would be about 2 weeks and she wold be eligible for in-patient hospice. Pt at high risk for an acute event 2/2 new bowel ischemia  Discharge Planning: TBD   Care plan was discussed with Dr. Mahala Menghini  Thank you for allowing the Palliative Medicine Team to assist in the care of this patient.   Time In: 1130 Time Out: 1155 Total Time 25 min Prolonged Time Billed  no     Greater than 50%  of this time was spent counseling and coordinating care related to the above assessment and plan.   Irean Hong, NP  08/30/2015, 3:16 PM  Please contact Palliative Medicine Team phone at [phone] for questions and concerns.

## 2015-08-30 NOTE — Progress Notes (Signed)
Hypoglycemic Event  CBG: 41  Treatment:ate breakfast and boost  Symptoms : sweating  Follow-up CBG: Time:0815 CBG Result:65, 110  Possible Reasons for Event:inadequate eating  Comments/MD notified:Dr. Mahala MenghiniSamtani on rounds    Burnis Kingfisheravis, Dionta Larke Simerly

## 2015-08-30 NOTE — Progress Notes (Signed)
Megan Vasquez GYJ:[id number] DOB: 12-15-1940 DOA: 09/08/2015 PCP: Maximino Greenland, MD  Brief narrative:  74 y/o ? ESRD dialsyes m/w/f @ [facility] Afib on coumadin Ty ii DM Htn   Admitted c Afib /RVR and cardiology consulted CT scan date of admission=?Nec entercolitis and seen by Gen surgery subsequently  Due to transient MS changes on 10/8 and hypotension CCM evaluated the patient and subsequently had dialysis catheter placd and needed CVVH and was transitioned back to HD  Still enuous statet Poor operative candidate hence palliative care has been consulted    Past medical history-As per Problem list Chart reviewed as below-   Consultants:  Nephro  Cardiology s/o 10/11  Procedures:    Antibiotics:  Vanc + Zosyn 10/4-->10/11   Subjective   Very weak and deconditioned Low-grade temps Clear liquid diet which she is tolerating and no overt dark stool noted    Objective    Interim History:   Telemetry:    Objective: Filed Vitals:   08/30/15 0245 08/30/15 0540 08/30/15 0735 08/30/15 1400  BP: 101/54 101/50 132/75 131/64  Pulse: 89 98 101 104  Temp: 99.5 F (37.5 C)   98.7 F (37.1 C)  TempSrc: Oral   Oral  Resp: 29 19 33 30  Height:  5' 7"  (1.702 m)    Weight:  70.4 kg (155 lb 3.3 oz)    SpO2: 100% 97% 99% 95%    Intake/Output Summary (Last 24 hours) at 08/30/15 1555 Last data filed at 08/30/15 1402  Gross per 24 hour  Intake    573 ml  Output      0 ml  Net    573 ml    Exam:  General: eomi ncat Cardiovascular: s1 s 2 tachy Respiratory:  clea rno added soudn Abdomen:  Soft nt slight distension, tender in RLQ with high-pitched sounds  Skin no le edema Neuro intact  Data Reviewed: Basic Metabolic Panel:  Recent Labs Lab 08/24/15 0510  08/25/15 0436 08/25/15 1515 08/26/15 0433 08/26/15 2024 08/27/15 0515 08/30/15 0327  NA 134*  < > 132* 132* 134* 132* 131* 132*  K 4.3  < > 3.9 4.1 3.9 4.7 4.4 4.6  CL 100*  < > 98* 98* 99*  99* 98* 96*  CO2 26  < > 27 26 27 26 26 27   GLUCOSE 228*  < > 158* 311* 122* 259* 140* 127*  BUN 8  < > 8 10 8 9 11  22*  CREATININE 1.53*  < > 1.38* 1.33* 1.23* 2.06* 2.73* 3.86*  CALCIUM 7.6*  < > 7.2* 7.2* 7.6* 7.9* 7.5* 7.8*  MG 2.2  --  2.2  --  2.3  --   --   --   PHOS 1.8*  < > 1.9* 3.6 2.0* 1.6*  --  2.8  < > = values in this interval not displayed. Liver Function Tests:  Recent Labs Lab 08/25/15 0436 08/25/15 1515 08/26/15 0433 08/26/15 2024 08/30/15 0327  ALBUMIN 1.4* 1.4* 1.5* 1.5* 1.4*   No results for input(s): LIPASE, AMYLASE in the last 168 hours. No results for input(s): AMMONIA in the last 168 hours. CBC:  Recent Labs Lab 08/24/15 0510  08/27/15 0515 08/28/15 0257 08/28/15 1413 08/29/15 0318 08/30/15 0327  WBC 17.8*  < > 17.0* 15.0* 22.5* 12.2* 13.4*  NEUTROABS 16.4*  --   --   --  19.4*  --   --   HGB 9.3*  < > 8.6* 7.4* 7.2* 8.8* 9.2*  HCT 30.2*  < >  27.8* 23.4* 23.0* 27.5* 28.5*  MCV 93.5  < > 93.9 94.0 93.1 92.9 91.1  PLT 111*  < > 144* 131* 175 158 169  < > = values in this interval not displayed. Cardiac Enzymes: No results for input(s): CKTOTAL, CKMB, CKMBINDEX, TROPONINI in the last 168 hours. BNP: Invalid input(s): POCBNP CBG:  Recent Labs Lab 08/29/15 2046 08/30/15 0753 08/30/15 0823 08/30/15 0905 08/30/15 1129  GLUCAP 129* 41* 65 110* 162*    Recent Results (from the past 240 hour(s))  Culture, blood (routine x 2)     Status: None   Collection Time: 08/21/15  7:50 AM  Result Value Ref Range Status   Specimen Description BLOOD HAND RIGHT  Final   Special Requests BOTTLES DRAWN AEROBIC AND ANAEROBIC 5CC  Final   Culture NO GROWTH 5 DAYS  Final   Report Status 08/26/2015 FINAL  Final  Culture, blood (routine x 2)     Status: None   Collection Time: 08/21/15  8:00 AM  Result Value Ref Range Status   Specimen Description BLOOD HAND RIGHT  Final   Special Requests BOTTLES DRAWN AEROBIC ONLY 10CC  Final   Culture NO GROWTH 5 DAYS   Final   Report Status 08/26/2015 FINAL  Final  MRSA PCR Screening     Status: None   Collection Time: 08/22/15  3:26 PM  Result Value Ref Range Status   MRSA by PCR NEGATIVE NEGATIVE Final    Comment:        The GeneXpert MRSA Assay (FDA approved for NASAL specimens only), is one component of a comprehensive MRSA colonization surveillance program. It is not intended to diagnose MRSA infection nor to guide or monitor treatment for MRSA infections.      Studies:              All Imaging reviewed and is as per above notation   Scheduled Meds: . sodium chloride   Intravenous Once  . carvedilol  10 mg Oral Daily  . darbepoetin (ARANESP) injection - DIALYSIS  150 mcg Intravenous Q Tue-HD  . doxercalciferol  1 mcg Intravenous Q T,Th,Sa-HD  . feeding supplement  1 Container Oral TID BM  . insulin aspart  0-15 Units Subcutaneous TID WC  . insulin glargine  10 Units Subcutaneous QHS  . levothyroxine  250 mcg Oral QAC breakfast  . midodrine  10 mg Oral TID WC  . multivitamin  1 tablet Oral QHS  . pneumococcal 23 valent vaccine  0.5 mL Intramuscular Tomorrow-1000  . pravastatin  40 mg Oral q1800  . sodium chloride  3 mL Intravenous Q12H  . 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  would not re-image at present.  Appreciate Palliative input palliative care to discuss with patient and family goals of care.  transfused 10/14 and Hb now 8.8.  Keep on clear liquids. 2. ESRD TTS-[facility].  dialysing 10/14.  Continue Midrodrine 10 tid for BP support. TDC for removal.  Dry wght 65kg 3. Afib + RVR, CHad2-Vasc2~4 [8%/yr risk] Cards s/o 10/11-continue prn metoprolol-cont Coreg 10 XL for rate control-Dig contraindicated in ESRD.  Currently rate is controlled    below 120.  Coumadin held 2/2 ? risk GIB-INR 1.9. Would not Anticoagulate as hi risk for  bleeding 4. DM ty ii-change to Q4 hr at request of daughter who wishes [place] to check  the cbg q4 despite being fed cld meals .  CBG's low overnight so cut back Lantus 2 units. 5. Hypothyroidism Levothyroxine 250 mcg.  Large dose.  Close recheck TFT's as op 6. Mod-severe MAR/PAH-medical management-not candidate operative 7. AOCD/Renal disease-Aranesp q tues.  See above. Poor surgical candidate.  8. 2/2 hypoparathyroidism-cont doxercalcif 1 mcg tts.  Per renal 9. hld-cont prvachol 40 daily 10. Met encephalopathy family states that this is secondary to blood sugar fluctuations.     Code Status: Partial Code Family Communication: Discussed with daughter extensively as per my conversation 10/16-she is perseverating on her CBG.  Checks which are not really the highest acuity issue.  Patient has spiked 2 temps of 101.  I do not think further antibiotic therapy without an end-point is meaningful and have explained this.  Patient still acutely  ill and no firm end-point in mind.  We will follow plans as per Palliative care--I suspect goals may have to be further clarified and solidified as an OP at SNF Disposition Plan:  Unclear-probable eventual SNF with Palliative following DVT prophylaxis: SCD Consultants: renal, palliative  Verneita Griffes, MD  [facility] Pager [phone] 08/30/2015, 3:55 PM    LOS: 12 days

## 2015-08-31 LAB — CBC
HCT: 28.7 % — ABNORMAL LOW (ref 36.0–46.0)
HEMOGLOBIN: 9.4 g/dL — AB (ref 12.0–15.0)
MCH: 29.6 pg (ref 26.0–34.0)
MCHC: 32.8 g/dL (ref 30.0–36.0)
MCV: 90.3 fL (ref 78.0–100.0)
Platelets: 210 10*3/uL (ref 150–400)
RBC: 3.18 MIL/uL — AB (ref 3.87–5.11)
RDW: 15.9 % — ABNORMAL HIGH (ref 11.5–15.5)
WBC: 15.4 10*3/uL — AB (ref 4.0–10.5)

## 2015-08-31 LAB — BASIC METABOLIC PANEL
ANION GAP: 9 (ref 5–15)
BUN: 29 mg/dL — ABNORMAL HIGH (ref 6–20)
CHLORIDE: 95 mmol/L — AB (ref 101–111)
CO2: 24 mmol/L (ref 22–32)
CREATININE: 4.77 mg/dL — AB (ref 0.44–1.00)
Calcium: 7.4 mg/dL — ABNORMAL LOW (ref 8.9–10.3)
GFR calc non Af Amer: 8 mL/min — ABNORMAL LOW (ref >60)
GFR, EST AFRICAN AMERICAN: 9 mL/min — AB (ref >60)
Glucose, Bld: 120 mg/dL — ABNORMAL HIGH (ref 65–99)
Potassium: 4.6 mmol/L (ref 3.5–5.1)
SODIUM: 128 mmol/L — AB (ref 135–145)

## 2015-08-31 LAB — GLUCOSE, CAPILLARY
GLUCOSE-CAPILLARY: 115 mg/dL — AB (ref 65–99)
GLUCOSE-CAPILLARY: 153 mg/dL — AB (ref 65–99)
Glucose-Capillary: 140 mg/dL — ABNORMAL HIGH (ref 65–99)
Glucose-Capillary: 194 mg/dL — ABNORMAL HIGH (ref 65–99)
Glucose-Capillary: 97 mg/dL (ref 65–99)

## 2015-08-31 MED ORDER — ALTEPLASE 2 MG IJ SOLR
2.0000 mg | Freq: Once | INTRAMUSCULAR | Status: DC | PRN
Start: 2015-08-31 — End: 2015-08-31

## 2015-08-31 MED ORDER — HEPARIN SODIUM (PORCINE) 1000 UNIT/ML DIALYSIS: 1000.0000 [IU] | INTRAMUSCULAR | Status: DC | PRN

## 2015-08-31 MED ORDER — LIDOCAINE-PRILOCAINE 2.5-2.5 % EX CREA: 1.0000 "application " | TOPICAL_CREAM | CUTANEOUS | Status: DC | PRN

## 2015-08-31 MED ORDER — SODIUM CHLORIDE 0.9 % IV SOLN: 100.0000 mL | INTRAVENOUS | Status: DC | PRN

## 2015-08-31 MED ORDER — PENTAFLUOROPROP-TETRAFLUOROETH EX AERO: 1.0000 "application " | INHALATION_SPRAY | CUTANEOUS | Status: DC | PRN

## 2015-08-31 MED ORDER — HEPARIN SODIUM (PORCINE) 1000 UNIT/ML DIALYSIS: 1000.0000 [IU] | Freq: Once | INTRAMUSCULAR | Status: DC

## 2015-08-31 MED ORDER — LIDOCAINE HCL (PF) 1 % IJ SOLN: 5.0000 mL | INTRAMUSCULAR | Status: DC | PRN

## 2015-08-31 NOTE — Consult Note (Signed)
[facility] CON[facility] Reason for consult: Ischemic Bowel Referring Physician: [facility].  Megan Vasquez is an 74 y.o. female.  HPI: She has a history of atrial fibrillation and had been on Coumadin. In spite of that she presented to the hospital with what was felt to be an embolic events to her cecum and right colon. She is on hemodialysis and has significant vascular disease. She has had peripheral vascular disease and been evaluated in the past by vascular surgery and not felt to be a surgical candidate. She has been on hemodialysis for approximately 15 years. She's had coronary disease has had previous CABG. CT scan on her admission showed pneumatosis of the right colon and cecal area.. This was felt to be consistent with acute ischemic colitis. At this point the patient has been evaluated by general surgery, vascular surgery, cardiology, critical care and palliative care. She is not felt to be a surgical candidate. She was started on diet and the diet advanced to solids and with some pain and drop in hemoglobin this was pushed back to clear liquids. Her anticoagulation is been on hold because she has had a drop in hemoglobin.  Past Medical History  Diagnosis Date  . Diabetes mellitus without complication ([place])   . Hypertension   . Hyperlipidemia   . Atrial fibrillation ([place])   . ESRD (end stage renal disease) ([place])     HD since 06/2000  . CAD (coronary artery disease)   . 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 History of nuclear stress test 10/2012    lexiscan; normal stress test; post-stress EF72%  . Peripheral arterial disease ([place])   . Osteoporosis   . Mitral regurgitation     severe by 2-D echocardiogram performed 10/22/14    Past Surgical History  Procedure Laterality Date  . Av fistula placement  2007    L brachial cephalic   .  Transthoracic echocardiogram  12/2010    EF=>55%, mod conc LVH; mild mitral annular calcif; mild MR; trace TR; trace PV regurg  . Lower extremity arterial doppler  10/02/2013    bilat ABIs could not be obtained (occluded vessels); bilat TBIs - no flow visualized; R mid SFA 70-99% diameter reduction; bilat runoff with posterior tibial, anterior tibial, peroneal arteries with occlusive disease   . Cardiac catheterization  12/19/2005    3 vessel disease with normal LV function, positive Cardiolite --> subsequent CABG (Dr. Adora Fridge)  . Coronary artery bypass graft  01/18/2006    LIMA to LAD, vein to diagonal, vein to marginal branch of Cfx, vein to distal RCA (Dr. Ellison Hughs)  . Carotid doppler  02/24/2009    R bulb 0-49% diameter refuction, R & L ICAs with significatn tortuosity (source of bruit?)   . Abdominal aortagram N/A 03/24/2014    Procedure: ABDOMINAL Maxcine Ham;  Surgeon: Angelia Mould, MD;  Location[facility] C[facility] LAB;  Service: Cardiovascular;  Laterality: N/A;    Family History  Problem Relation Age of Onset  . Heart disease Mother   . Heart attack Brother   . Breast cancer Sister   . Diabetes Sister   . Heart attack Sister   . Diabetes Child     Social History:  reports that she quit smoking about 6 years ago. She has never used smokeless tobacco. She reports that she does not drink  alcohol or use illicit drugs.  Allergies: No Known Allergies  Medications; Prior to Admission medications   Medication Sig Start Date End Date Taking? Authorizing Provider  calcium acetate (PHOSLO) 667 MG capsule Take 1,334 mg by mouth 3 (three) times daily with meals.  12/21/13  Yes Historical Provider, MD  diltiazem (CARDIZEM CD) 240 MG 24 hr capsule TAKE ONE CAPSULE BY MOUTH DAILY 05/19/15  Yes Lorretta Harp, MD  insulin glargine (LANTUS) 100 UNIT/ML injection Inject 10 Units into the skin 2 (two) times daily. 10 units twice a day   Yes Historical Provider, MD  levothyroxine (SYNTHROID, LEVOTHROID)  200 MCG tablet Take 200 mcg by mouth daily before breakfast. Take with 59mg to equal total dose of 253m   Yes Historical Provider, MD  levothyroxine (SYNTHROID, LEVOTHROID) 50 MCG tablet Take 50 mcg by mouth daily. Take  With 20031mto equal 250m37m2/1/15  Yes Historical Provider, MD  metoprolol succinate (TOPROL-XL) 50 MG 24 hr tablet Take 1/2 tablet (25mg58m mouth daily. Patient taking differently: Take 25 mg by mouth daily.  11/17/14  Yes JonatLorretta Harp pravastatin (PRAVACHOL) 40 MG tablet Take 40 mg by mouth every morning.    Yes Historical Provider, MD  warfarin (COUMADIN) 3 MG tablet Take 4.5-6 mg by mouth daily. Take 2 tablets (6mg) 91mMonday, and 1 1/2 (4.5MG) tablet all other days   Yes Historical Provider, MD  acetaminophen (TYLENOL) 325 MG tablet Take 325 mg by mouth every 6 (six) hours as needed for moderate pain.    Historical Provider, MD  Calcium Carbonate (CALCIUM 600 PO) Take 1 tablet by mouth 3 (three) times daily.     Historical Provider, MD  diltiazem (CARDIZEM CD) 240 MG 24 hr capsule Take 240 mg by mouth daily.    Historical Provider, MD  lidocaine-prilocaine (EMLA) cream Apply 1 application topically Every Tuesday,Thursday,and Saturday with dialysis.    Historical Provider, MD  metoprolol succinate (TOPROL-XL) 50 MG 24 hr tablet Take 25 mg by mouth daily. Take with or immediately following a meal.    Historical Provider, MD  OVER TIron Riverlication topically Every Tuesday,Thursday,and Saturday with dialysis. Diabetic foot cream used for HD    Historical Provider, MD  warfarin (COUMADIN) 3 MG tablet Take 1.5 to 2 tablets by mouth daily as directed by coumadin clinic Patient not taking: Reported on 08/26/2015 01/05/15   [place] Sinewarfarin (COUMADIN) 3 MG tablet TAKE 1.5 TO 2 TABLETS BY MOUTH DAILY AS DIRECTED BY COUMADIN CLINIC Patient not taking: Reported on 08/16/2015 08/17/15   JonathLorretta Harp . sodium chloride   Intravenous Once   . carvedilol  10 mg Oral Daily  . darbepoetin (ARANESP) injection - DIALYSIS  150 mcg Intravenous Q Tue-HD  . doxercalciferol  1 mcg Intravenous Q T,Th,Sa-HD  . feeding supplement  1 Container Oral TID BM  . insulin aspart  0-15 Units Subcutaneous Q4H  . insulin glargine  2 Units Subcutaneous QHS  . levothyroxine  250 mcg Oral QAC breakfast  . midodrine  10 mg Oral TID WC  . multivitamin  1 tablet Oral QHS  . pneumococcal 23 valent vaccine  0.5 mL Intramuscular Tomorrow-1000  . pravastatin  40 mg Oral q1800  . sodium chloride  3 mL Intravenous Q12H  . sodium phosphate  Dextrose 5% IVPB  30 mmol Intravenous Once   PRN Meds sodium chloride, acetaminophen, metoprolol, ondansetron **OR** ondansetron (ZOFRAN) IV, sodium chloride,  sodium chloride Results for orders placed or performed during the hospital encounter of 08/17/2015 (from the past 48 hour(s))  Glucose, capillary     Status: None   Collection Time: 08/29/15  4:19 PM  Result Value Ref Range   Glucose-Capillary 85 65 - 99 mg/dL  Glucose, capillary     Status: Abnormal   Collection Time: 08/29/15  8:46 PM  Result Value Ref Range   Glucose-Capillary 129 (H) 65 - 99 mg/dL  CBC     Status: Abnormal   Collection Time: 08/30/15  3:27 AM  Result Value Ref Range   WBC 13.4 (H) 4.0 - 10.5 K/uL   RBC 3.13 (L) 3.87 - 5.11 MIL/uL   Hemoglobin 9.2 (L) 12.0 - 15.0 g/dL   HCT 28.5 (L) 36.0 - 46.0 %   MCV 91.1 78.0 - 100.0 fL   MCH 29.4 26.0 - 34.0 pg   MCHC 32.3 30.0 - 36.0 g/dL   RDW 16.1 (H) 11.5 - 15.5 %   Platelets 169 150 - 400 K/uL  Basic metabolic panel     Status: Abnormal   Collection Time: 08/30/15  3:27 AM  Result Value Ref Range   Sodium 132 (L) 135 - 145 mmol/L   Potassium 4.6 3.5 - 5.1 mmol/L   Chloride 96 (L) 101 - 111 mmol/L   CO2 27 22 - 32 mmol/L   Glucose, Bld 127 (H) 65 - 99 mg/dL   BUN 22 (H) 6 - 20 mg/dL   Creatinine, Ser 3.86 (H) 0.44 - 1.00 mg/dL   Calcium 7.8 (L) 8.9 - 10.3 mg/dL   GFR calc non Af Amer 10  (L) >60 mL/min   GFR calc Af Amer 12 (L) >60 mL/min    Comment: (NOTE) The eGFR has been calculated using the CKD EPI equation. This calculation has not been validated in all clinical situations. eGFR's persistently <60 mL/min signify possible Chronic Kidney Disease.    Anion gap 9 5 - 15  Albumin     Status: Abnormal   Collection Time: 08/30/15  3:27 AM  Result Value Ref Range   Albumin 1.4 (L) 3.5 - 5.0 g/dL  Phosphorus     Status: None   Collection Time: 08/30/15  3:27 AM  Result Value Ref Range   Phosphorus 2.8 2.5 - 4.6 mg/dL  Glucose, capillary     Status: Abnormal   Collection Time: 08/30/15  7:53 AM  Result Value Ref Range   Glucose-Capillary 41 (LL) 65 - 99 mg/dL  Glucose, capillary     Status: None   Collection Time: 08/30/15  8:23 AM  Result Value Ref Range   Glucose-Capillary 65 65 - 99 mg/dL  Glucose, capillary     Status: Abnormal   Collection Time: 08/30/15  9:05 AM  Result Value Ref Range   Glucose-Capillary 110 (H) 65 - 99 mg/dL  Glucose, capillary     Status: Abnormal   Collection Time: 08/30/15 11:29 AM  Result Value Ref Range   Glucose-Capillary 162 (H) 65 - 99 mg/dL  Glucose, capillary     Status: Abnormal   Collection Time: 08/30/15  4:26 PM  Result Value Ref Range   Glucose-Capillary 131 (H) 65 - 99 mg/dL  Glucose, capillary     Status: Abnormal   Collection Time: 08/30/15  9:36 PM  Result Value Ref Range   Glucose-Capillary 147 (H) 65 - 99 mg/dL  Glucose, capillary     Status: Abnormal   Collection Time: 08/31/15 12:37 AM  Result Value  Ref Range   Glucose-Capillary 140 (H) 65 - 99 mg/dL  Glucose, capillary     Status: Abnormal   Collection Time: 08/31/15  6:13 AM  Result Value Ref Range   Glucose-Capillary 115 (H) 65 - 99 mg/dL  CBC     Status: Abnormal   Collection Time: 08/31/15  6:15 AM  Result Value Ref Range   WBC 15.4 (H) 4.0 - 10.5 K/uL   RBC 3.18 (L) 3.87 - 5.11 MIL/uL   Hemoglobin 9.4 (L) 12.0 - 15.0 g/dL   HCT 28.7 (L) 36.0 -  46.0 %   MCV 90.3 78.0 - 100.0 fL   MCH 29.6 26.0 - 34.0 pg   MCHC 32.8 30.0 - 36.0 g/dL   RDW 15.9 (H) 11.5 - 15.5 %   Platelets 210 150 - 400 K/uL  Basic metabolic panel     Status: Abnormal   Collection Time: 08/31/15  6:15 AM  Result Value Ref Range   Sodium 128 (L) 135 - 145 mmol/L   Potassium 4.6 3.5 - 5.1 mmol/L   Chloride 95 (L) 101 - 111 mmol/L   CO2 24 22 - 32 mmol/L   Glucose, Bld 120 (H) 65 - 99 mg/dL   BUN 29 (H) 6 - 20 mg/dL   Creatinine, Ser 4.77 (H) 0.44 - 1.00 mg/dL   Calcium 7.4 (L) 8.9 - 10.3 mg/dL   GFR calc non Af Amer 8 (L) >60 mL/min   GFR calc Af Amer 9 (L) >60 mL/min    Comment: (NOTE) The eGFR has been calculated using the CKD EPI equation. This calculation has not been validated in all clinical situations. eGFR's persistently <60 mL/min signify possible Chronic Kidney Disease.    Anion gap 9 5 - 15  Glucose, capillary     Status: None   Collection Time: 08/31/15 12:25 PM  Result Value Ref Range   Glucose-Capillary 97 65 - 99 mg/dL   Comment 1 Notify RN     No results found.             Blood pressure 149/71, pulse 123, temperature 99.5 F (37.5 C), temperature source Oral, resp. rate 24, height 5' 7"  (1.702 m), weight 68.9 kg (151 lb 14.4 oz), SpO2 100 %.  Physical exam:   General-- fairly alert African-American female in no acute distress ENT-- nonicteric  Heart-- regular rate and rhythm Lungs-- grossly clear Abdomen-- nondistended with bowel sounds present and generally soft Psych-- oriented and appears to be appropriate   Assessment: 1. Acute enterocolitis. Presentation CT scan along with her history certainly suggest ischemia due to symbolic event is etiology. Repeat CT is still shown some edema and signs of ischemia. She has been having bowel movements and tolerating diet to some extent. I completely agree she's not a surgical candidate and have expressed this clearly to her daughter and daughter-in-law. She does seem to be  tolerating diet somewhat and I think the issue will be how quickly to advance it. 2. Atrial fibrillation and RVR. She probably had symbolic event in spite of being on anticoagulation. 3. Drop in hemoglobin. Probably due to bleeding in her G.I. tract from ischemic injury that hopefully should be improving 4. Type II diabetes 5. ESRD. On hemodialysis  Plan: long discussion with the family. I've told him that I basically did not feel that she was any type of candidate for any invasive procedure or diagnostic test. I would keep her own liquids for now advance very slowly. We can check her stool  for blood and if she's tolerating diet and her stools are negative at some point we could consider resuming her anticoagulation. We'll go ahead and order stool test for FOB.   Kaylub Detienne JR,Duncan Alejandro L 08/31/2015, 3:18 PM   Pager: [phone] If no answer or after hours call [phone]

## 2015-08-31 NOTE — Progress Notes (Signed)
Megan Vasquez YFV:[id number] DOB: 17-Sep-1941 DOA: 08/27/2015 PCP: Maximino Greenland, MD  Brief narrative:  74 y/o ? ESRD dialsyes m/w/f [facility] on coumadin Ty ii DM Htn   Admitted c Afib /RVR and cardiology consulted CT scan date of admission=?Nec entercolitis and seen by Gen surgery subsequently  Due to transient MS changes on 10/8 and hypotension CCM evaluated the patient and subsequently had dialysis catheter placd and needed CVVH and was transitioned back to HD  Still enuous statet Poor operative candidate hence palliative care has been consulted    Past medical history-As per Problem list Chart reviewed as below-   Consultants:  Nephro  Cardiology s/o 10/11  Procedures:    Antibiotics:  Vanc + Zosyn 10/4-->10/11   Subjective   Seems more alert and conversant Compared to yesterday can tell me now date, city, time, year and [place] when she contaminated this yesterday No spontaneous complaints She thinks she is doing well-I'm not sure how much she understands No further spikes in temperature.   Objective    Interim History:   Telemetry:    Objective: Filed Vitals:   08/31/15 0337 08/31/15 0711 08/31/15 0728 08/31/15 0800  BP: 129/58 134/71 142/71 132/59  Pulse:   106 103  Temp: 98.8 F (37.1 C) 98.3 F (36.8 C)    TempSrc:  Oral    Resp: 26  22 22   Height:      Weight: 72.349 kg (159 lb 8 oz) 73.2 kg (161 lb 6 oz)    SpO2: 99%       Intake/Output Summary (Last 24 hours) at 08/31/15 0818 Last data filed at 08/30/15 1402  Gross per 24 hour  Intake    240 ml  Output      0 ml  Net    240 ml    Exam:  General: eomi ncat Cardiovascular: s1 s 2 tachy Respiratory:  Clear than prior Abdomen:  Soft nt slight distension, tender in RLQ with high-pitched sounds  Skin no le edema Neuro intact  Data Reviewed: Basic Metabolic Panel:  Recent Labs Lab 08/25/15 0436 08/25/15 1515 08/26/15 0433 08/26/15 2024 08/27/15 0515  08/30/15 0327 08/31/15 0615  NA 132* 132* 134* 132* 131* 132* 128*  K 3.9 4.1 3.9 4.7 4.4 4.6 4.6  CL 98* 98* 99* 99* 98* 96* 95*  CO2 27 26 27 26 26 27 24   GLUCOSE 158* 311* 122* 259* 140* 127* 120*  BUN 8 10 8 9 11  22* 29*  CREATININE 1.38* 1.33* 1.23* 2.06* 2.73* 3.86* 4.77*  CALCIUM 7.2* 7.2* 7.6* 7.9* 7.5* 7.8* 7.4*  MG 2.2  --  2.3  --   --   --   --   PHOS 1.9* 3.6 2.0* 1.6*  --  2.8  --    Liver Function Tests:  Recent Labs Lab 08/25/15 0436 08/25/15 1515 08/26/15 0433 08/26/15 2024 08/30/15 0327  ALBUMIN 1.4* 1.4* 1.5* 1.5* 1.4*   No results for input(s): LIPASE, AMYLASE in the last 168 hours. No results for input(s): AMMONIA in the last 168 hours. CBC:  Recent Labs Lab 08/28/15 0257 08/28/15 1413 08/29/15 0318 08/30/15 0327 08/31/15 0615  WBC 15.0* 22.5* 12.2* 13.4* 15.4*  NEUTROABS  --  19.4*  --   --   --   HGB 7.4* 7.2* 8.8* 9.2* 9.4*  HCT 23.4* 23.0* 27.5* 28.5* 28.7*  MCV 94.0 93.1 92.9 91.1 90.3  PLT 131* 175 158 169 210   Cardiac Enzymes: No results for input(s):  CKTOTAL, CKMB, CKMBINDEX, TROPONINI in the last 168 hours. BNP: Invalid input(s): POCBNP CBG:  Recent Labs Lab 08/30/15 1129 08/30/15 1626 08/30/15 2136 08/31/15 0037 08/31/15 0613  GLUCAP 162* 131* 147* 140* 115*    Recent Results (from the past 240 hour(s))  MRSA PCR Screening     Status: None   Collection Time: 08/22/15  3:26 PM  Result Value Ref Range Status   MRSA by PCR NEGATIVE NEGATIVE Final    Comment:        The GeneXpert MRSA Assay (FDA approved for NASAL specimens only), is one component of a comprehensive MRSA colonization surveillance program. It is not intended to diagnose MRSA infection nor to guide or monitor treatment for MRSA infections.      Studies:              All Imaging reviewed and is as per above notation   Scheduled Meds: . sodium chloride   Intravenous Once  . carvedilol  10 mg Oral Daily  . darbepoetin (ARANESP) injection -  DIALYSIS  150 mcg Intravenous Q Tue-HD  . doxercalciferol  1 mcg Intravenous Q T,Th,Sa-HD  . feeding supplement  1 Container Oral TID BM  . insulin aspart  0-15 Units Subcutaneous Q4H  . insulin glargine  2 Units Subcutaneous QHS  . levothyroxine  250 mcg Oral QAC breakfast  . midodrine  10 mg Oral TID WC  . multivitamin  1 tablet Oral QHS  . pneumococcal 23 valent vaccine  0.5 mL Intramuscular Tomorrow-1000  . pravastatin  40 mg Oral q1800  . sodium chloride  3 mL Intravenous Q12H  . 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 Leukocytosis likely secondary to low-grade infection. Appreciate Palliative input .  transfused 10/14. Hemoglobin has remained stable in the 9 range.  keep on clear liquid diet and monitor without graduation of diet 2. ESRD TTS-[facility].  dialysed 10/17 on early shift as somewhat more short of breath on 10/60 with crackles  Continue Midrodrine 10 tid for BP support. TDC for removal.  Dry wght 65kg-she is slightly over that at 73 kg. Nephrology to adjust fluid status 3. Afib + RVR, CHad2-Vasc2~4 [8%/yr risk] Cards s/o 10/11-continue prn metoprolol-cont Coreg 10 XL for rate control-Dig contraindicated in ESRD.  Currently rate is controlled  Around 120.  Coumadin held 2/2 ? risk GIB-INR 1.9. Would not Anticoagulate as hi risk for bleeding 4. DM ty ii-change to Q4 hr at request of daughter who wishes [place] to check the cbg q4 despite being fed cld meals .CBG's low overnight so cut back Lantus 2 units. Blood sugars have stabilized 115-80 range 5. Hypothyroidism Levothyroxine 250 mcg.  Large dose.  Close recheck TFT's as op 6. Mod-severe MAR/PAH-medical management-not candidate operative 7. AOCD/Renal disease-Aranesp q tues.  See above. Poor surgical candidate.  8. 2/2 hypoparathyroidism-cont doxercalcif 1 mcg tts.  Per  renal 9. hld-cont prvachol 40 daily 10. Met encephalopathy family states that this is secondary to blood sugar fluctuations.     Code Status: Partial Code Family Communication: No family present at bedside.  Discussions are ongoing with palliative care Disposition Plan:  ?SNF with Palliative following in the next couple of days DVT prophylaxis: SCD Consultants: renal, palliative  Verneita Griffes, MD  [facility] Pager [phone] 08/31/2015, 8:18 AM    LOS: 13 days

## 2015-08-31 NOTE — Progress Notes (Addendum)
Admit: 09/14/2015 LOS: 13  1F ESRD [facility] via AVF w/ pulmonary edema/hypervolemia, colonic pneumatosis likeyl 2/2 ischemia, AFib, known ASCVD.   Subjective:  No complaints   10/16 0701 - 10/17 0700 In: 240 [P.O.:240] Out: -   Filed Weights   08/30/15 0540 08/31/15 0337 08/31/15 0711  Weight: 70.4 kg (155 lb 3.3 oz) 72.349 kg (159 lb 8 oz) 73.2 kg (161 lb 6 oz)    Scheduled Meds: . sodium chloride   Intravenous Once  . carvedilol  10 mg Oral Daily  . darbepoetin (ARANESP) injection - DIALYSIS  150 mcg Intravenous Q Tue-HD  . doxercalciferol  1 mcg Intravenous Q T,Th,Sa-HD  . feeding supplement  1 Container Oral TID BM  . insulin aspart  0-15 Units Subcutaneous Q4H  . insulin glargine  2 Units Subcutaneous QHS  . levothyroxine  250 mcg Oral QAC breakfast  . midodrine  10 mg Oral TID WC  . multivitamin  1 tablet Oral QHS  . pneumococcal 23 valent vaccine  0.5 mL Intramuscular Tomorrow-1000  . pravastatin  40 mg Oral q1800  . sodium chloride  3 mL Intravenous Q12H  . sodium phosphate  Dextrose 5% IVPB  30 mmol Intravenous Once   Continuous Infusions:   PRN Meds:.sodium chloride, acetaminophen, metoprolol, ondansetron **OR** ondansetron (ZOFRAN) IV, sodium chloride, sodium chloride  Current Labs: reviewed   Dialysis Orders: [facility] 4 hrs 65.5kgs 2K/2Ca L AVF 1000u hep hectorol 2 venofor 100 q HD until 10/8 then 50 q week Micera 100 q2 weeks - to start 10/6  Physical Exam:  Blood pressure 132/59, pulse 103, temperature 98.3 F (36.8 C), temperature source Oral, resp. rate 22, height 5\' 7"  (1.702 m), weight 73.2 kg (161 lb 6 oz), SpO2 99 %. Chronically ill appearing abd w/ diffuse TTP no R/G IRIR, tachy, no rub CTAB LUE AVF + B/T Pitting edema to dependent areas   Assessment: 1 ESRD s/p CRRT. Using LUE AVF 2 Hypotension, improved on midodrine 3 Vol excess up 8kg since admission 4 Colitis / abnl CT - suspected ischemic, better, no abx; not surg  candidate 5 Perm afib on coreg 6 hx CABG 7 Anemia cont darbe 8 MBD low P, cont hect 9 Malnutrition alb 1's 10 Debility major issue 11 Partial code  Plan - HD today. If R IJ HD cath not needed for IV access, please ask IV team to remove.   Vinson Moselleob Iktan Aikman MD [facility] pager [phone]    cell [phone] 08/31/2015, 9:08 AM       Recent Labs Lab 08/26/15 0433 08/26/15 2024 08/27/15 0515 08/30/15 0327 08/31/15 0615  NA 134* 132* 131* 132* 128*  K 3.9 4.7 4.4 4.6 4.6  CL 99* 99* 98* 96* 95*  CO2 27 26 26 27 24   GLUCOSE 122* 259* 140* 127* 120*  BUN 8 9 11  22* 29*  CREATININE 1.23* 2.06* 2.73* 3.86* 4.77*  CALCIUM 7.6* 7.9* 7.5* 7.8* 7.4*  PHOS 2.0* 1.6*  --  2.8  --     Recent Labs Lab 08/28/15 1413 08/29/15 0318 08/30/15 0327 08/31/15 0615  WBC 22.5* 12.2* 13.4* 15.4*  NEUTROABS 19.4*  --   --   --   HGB 7.2* 8.8* 9.2* 9.4*  HCT 23.0* 27.5* 28.5* 28.7*  MCV 93.1 92.9 91.1 90.3  PLT 175 158 169 210

## 2015-08-31 NOTE — Progress Notes (Signed)
Daily Progress Note   Patient Name: Megan Vasquez       Date: 08/31/2015 DOB: Jun 02, 1941  Age: 74 y.o. MRN#: [id number] Attending Physician: Rhetta Mura, MD Primary Care Physician: [name], MD Admit Date: 08/29/2015  Reason for Consultation/Follow-up: Establishing goals of care  Subjective:     I spoke RN and Dr. Mahala Menghini for update. GI to see patient today. Family not at bedside. I visited with Ms. Lorraine Lax and she tells me she feels "fine." Only complaint is occasional pain in foot - denies any abdominal pain. She complains about dialysis taking so long today. She is very delayed in her responses and sometimes does not respond to me. Unable to tell me what the doctors have been saying is wrong with her. Unfortunately she is unable to sit up for dialysis at this point and is very debilitated. I will follow up tomorrow and attempt to touch base with family as well.    Length of Stay: 13 days  Current Medications: Scheduled Meds:  . sodium chloride   Intravenous Once  . carvedilol  10 mg Oral Daily  . darbepoetin (ARANESP) injection - DIALYSIS  150 mcg Intravenous Q Tue-HD  . doxercalciferol  1 mcg Intravenous Q T,Th,Sa-HD  . feeding supplement  1 Container Oral TID BM  . insulin aspart  0-15 Units Subcutaneous Q4H  . insulin glargine  2 Units Subcutaneous QHS  . levothyroxine  250 mcg Oral QAC breakfast  . midodrine  10 mg Oral TID WC  . multivitamin  1 tablet Oral QHS  . pneumococcal 23 valent vaccine  0.5 mL Intramuscular Tomorrow-1000  . pravastatin  40 mg Oral q1800  . sodium chloride  3 mL Intravenous Q12H  . sodium phosphate  Dextrose 5% IVPB  30 mmol Intravenous Once    Continuous Infusions:    PRN Meds: sodium chloride, acetaminophen, metoprolol, ondansetron **OR** ondansetron (ZOFRAN) IV, sodium chloride, sodium chloride  Palliative Performance Scale: 20-30%     Vital Signs: BP 149/71 mmHg  Pulse 123  Temp(Src) 99.5 F (37.5 C) (Oral)   Resp 24  Ht  (1.702 m)  Wt 68.9 kg (151 lb 14.4 oz)  BMI 23.78 kg/m2  SpO2 100% SpO2: SpO2: 100 % O2 Device: O2 Device: Not Delivered O2 Flow Rate: O2 Flow Rate (L/min): 2 L/min  Intake/output summary:  Intake/Output Summary (Last 24 hours) at 08/31/15 1656 Last data filed at 08/31/15 1300  Gross per 24 hour  Intake    360 ml  Output   3000 ml  Net  -2640 ml   LBM: Last BM Date: 08/30/15 Baseline Weight: Weight: 64.3 kg (141 lb 12.1 oz) Most recent weight: Weight: 68.9 kg (151 lb 14.4 oz)  Physical Exam: General: NAD, sleepy HEENT: [place]/AT CVS: Tachy Resp: No labored breathing, room air Abd: Soft, ND, tender to palpation, +BS Neuro: Confused, delayed response   Additional Data Reviewed: Recent Labs     08/30/15  0327  08/31/15  0615  WBC  13.4*  15.4*  HGB  9.2*  9.4*  PLT  169  210  NA  132*  128*  BUN  22*  29*  CREATININE  3.86*  4.77*     Problem List:  Patient Active Problem List   Diagnosis Date Noted  . Palliative care encounter   . Hypotension   . Ischemia, bowel (HCC)   . Pneumatosis coli   . Acute respiratory failure with hypoxia (HCC)   . Hemodialysis catheter dysfunction (HCC)   .  Sepsis (HCC) 08/21/2015  . Fever, unspecified 08/21/2015  . Leukocytosis 08/21/2015  . PAD (peripheral artery disease) (HCC) 08/21/2015  . Right foot pain 08/21/2015  . RLQ abdominal pain 03-25-15  . Thrombocytopenia (HCC) 03-25-15  . Anemia, chronic renal failure 03-25-15  . Hypothyroidism 03-25-15  . Metabolic encephalopathy 03-25-15  . Severe mitral regurgitation 10/15/2014  . Coronary artery disease 10/18/2013  . Peripheral arterial disease (HCC) 10/18/2013  . Diabetes mellitus, insulin dependent (IDDM), controlled (HCC) 06/10/2013  . HTN (hypertension) 06/10/2013  . CKD (chronic kidney disease) stage V requiring chronic dialysis (HCC) 06/10/2013  . HLD (hyperlipidemia) 06/10/2013  . Atrial fibrillation with RVR (HCC) 02/01/2013  . Long term  current use of anticoagulant therapy 02/01/2013     Palliative Care Assessment & Plan    Code Status:  Limited code - no intubation per documented, did not discuss today  Goals of Care:  Continue care with hopes of improvement. They do not want surgery. Unclear the expectations of improvement at this point.   3. Symptom Management:  Pain: Continue tylenol prn.    5. Prognosis: Prognosis is certainly guarded with debilitation and questionable ability to continue HD outpatient and depending on progress of ischemic bowel and response to medical management.  5. Discharge Planning: To be determined.    Thank you for allowing the Palliative Medicine Team to assist in the care of this patient.   Time In: 1420 Time Out: 1440 Total Time 20min Prolonged Time Billed  no     Greater than 50%  of this time was spent counseling and coordinating care related to the above assessment and plan.     Yong ChannelAlicia Shawana Knoch, NP Palliative Medicine Team Pager # [phone] (M-F 8a-5p) Team Phone # [phone] (Nights/Weekends)  08/31/2015, 4:56 PM

## 2015-09-01 LAB — BASIC METABOLIC PANEL
ANION GAP: 10 (ref 5–15)
BUN: 17 mg/dL (ref 6–20)
CALCIUM: 7.3 mg/dL — AB (ref 8.9–10.3)
CO2: 25 mmol/L (ref 22–32)
CREATININE: 3.65 mg/dL — AB (ref 0.44–1.00)
Chloride: 93 mmol/L — ABNORMAL LOW (ref 101–111)
GFR calc non Af Amer: 11 mL/min — ABNORMAL LOW (ref >60)
GFR, EST AFRICAN AMERICAN: 13 mL/min — AB (ref >60)
Glucose, Bld: 203 mg/dL — ABNORMAL HIGH (ref 65–99)
Potassium: 3.9 mmol/L (ref 3.5–5.1)
SODIUM: 128 mmol/L — AB (ref 135–145)

## 2015-09-01 LAB — GLUCOSE, CAPILLARY
GLUCOSE-CAPILLARY: 110 mg/dL — AB (ref 65–99)
GLUCOSE-CAPILLARY: 110 mg/dL — AB (ref 65–99)
GLUCOSE-CAPILLARY: 228 mg/dL — AB (ref 65–99)
Glucose-Capillary: 106 mg/dL — ABNORMAL HIGH (ref 65–99)
Glucose-Capillary: 123 mg/dL — ABNORMAL HIGH (ref 65–99)
Glucose-Capillary: 133 mg/dL — ABNORMAL HIGH (ref 65–99)
Glucose-Capillary: 179 mg/dL — ABNORMAL HIGH (ref 65–99)
Glucose-Capillary: 54 mg/dL — ABNORMAL LOW (ref 65–99)
Glucose-Capillary: 94 mg/dL (ref 65–99)

## 2015-09-01 LAB — CBC
HCT: 27.6 % — ABNORMAL LOW (ref 36.0–46.0)
HEMOGLOBIN: 9 g/dL — AB (ref 12.0–15.0)
MCH: 29.9 pg (ref 26.0–34.0)
MCHC: 32.6 g/dL (ref 30.0–36.0)
MCV: 91.7 fL (ref 78.0–100.0)
Platelets: 196 10*3/uL (ref 150–400)
RBC: 3.01 MIL/uL — ABNORMAL LOW (ref 3.87–5.11)
RDW: 15.9 % — AB (ref 11.5–15.5)
WBC: 15.8 10*3/uL — AB (ref 4.0–10.5)

## 2015-09-01 LAB — TYPE AND SCREEN
ABO/RH(D): A POS
Antibody Screen: NEGATIVE
UNIT DIVISION: 0
UNIT DIVISION: 0
Unit division: 0

## 2015-09-01 LAB — HEPARIN LEVEL (UNFRACTIONATED): Heparin Unfractionated: 0.27 IU/mL — ABNORMAL LOW (ref 0.30–0.70)

## 2015-09-01 LAB — OCCULT BLOOD X 1 CARD TO LAB, STOOL: Fecal Occult Bld: NEGATIVE

## 2015-09-01 MED ORDER — HEPARIN (PORCINE) IN NACL 100-0.45 UNIT/ML-% IJ SOLN
1250.0000 [IU]/h | INTRAMUSCULAR | Status: DC
  Administered 2015-09-01: 1000 [IU]/h via INTRAVENOUS
  Filled 2015-09-01 (×5): qty 250

## 2015-09-01 MED ORDER — MIDODRINE HCL 5 MG PO TABS
ORAL_TABLET | ORAL | Status: AC
  Administered 2015-09-01: 13:00:00
  Filled 2015-09-01: qty 2

## 2015-09-01 MED ORDER — DOXERCALCIFEROL 4 MCG/2ML IV SOLN
INTRAVENOUS | Status: AC
  Filled 2015-09-01: qty 2

## 2015-09-01 MED ORDER — DARBEPOETIN ALFA 150 MCG/0.3ML IJ SOSY
PREFILLED_SYRINGE | INTRAMUSCULAR | Status: AC
  Filled 2015-09-01: qty 0.3

## 2015-09-01 MED ORDER — INSULIN GLARGINE 100 UNIT/ML ~~LOC~~ SOLN
4.0000 [IU] | Freq: Every day | SUBCUTANEOUS | Status: DC
  Administered 2015-09-01 – 2015-09-02 (×2): 4 [IU] via SUBCUTANEOUS
  Filled 2015-09-01 (×3): qty 0.04

## 2015-09-01 NOTE — Progress Notes (Signed)
Megan Vasquez GYJ:[id number] DOB: 08-Nov-1941 DOA: 08/29/2015 PCP: Maximino Greenland, MD  Brief narrative:  74 y/o ? ESRD dialsyes [facility]/f [facility] on coumadin Ty ii DM Htn  Admitted c Afib /RVR and cardiology consulted CT scan date of admission=?Nec entercolitis and seen by Gen surgery subsequently  Due to transient MS changes on 10/8 and hypotension CCM evaluated the patient and subsequently had dialysis catheter placd and needed CVVH and was transitioned back to HD  Still enuous statet Poor operative candidate hence palliative care has been consulted  Past medical history-As per Problem list Chart reviewed as below-   Consultants:  Nephro  Cardiology s/o 10/11  Procedures:    Antibiotics:  Vanc + Zosyn 10/4-->10/11   Subjective   Spiking temps. No f now No n/v tol clear Hemoccult neg Sat up in dilaysis for sometime   Objective    Interim History:   Telemetry:    Objective: Filed Vitals:   09/01/15 1100 09/01/15 1200 09/01/15 1233 09/01/15 1324  BP: 127/62 135/68 129/72   Pulse: 108 101 117   Temp: 97.7 F (36.5 C)  97 F (36.1 C) 97.8 F (36.6 C)  TempSrc: Oral  Oral Oral  Resp: _0 Height:      Weight:   66.2 kg (145 lb 15.1 oz)   SpO2:        Intake/Output Summary (Last 24 hours) at 09/01/15 1614 Last data filed at 09/01/15 1233  Gross per 24 hour  Intake    600 ml  Output   3012 ml  Net  -2412 ml    Exam:  General: eomi ncat Cardiovascular: s1 s 2 tachy Respiratory:  Clear than prior Abdomen:  Soft nt slight distension, tender in RLQ with high-pitched sounds  Skin no le edema Neuro intact  Data Reviewed: Basic Metabolic Panel:  Recent Labs Lab 08/26/15 0433 08/26/15 2024 08/27/15 0515 08/30/15 0327 08/31/15 0615 09/01/15 0610  NA 134* 132* 131* 132* 128* 128*  K 3.9 4.7 4.4 4.6 4.6 3.9  CL 99* 99* 98* 96* 95* 93*  CO2 _1 GLUCOSE 122* 259* 140* 127* 120* 203*  BUN _2 22* 29* 17   CREATININE 1.23* 2.06* 2.73* 3.86* 4.77* 3.65*  CALCIUM 7.6* 7.9* 7.5* 7.8* 7.4* 7.3*  MG 2.3  --   --   --   --   --   PHOS 2.0* 1.6*  --  2.8  --   --    Liver Function Tests:  Recent Labs Lab 08/26/15 0433 08/26/15 2024 08/30/15 0327  ALBUMIN 1.5* 1.5* 1.4*   No results for input(s): LIPASE, AMYLASE in the last 168 hours. No results for input(s): AMMONIA in the last 168 hours. CBC:  Recent Labs Lab 08/28/15 1413 08/29/15 0318 08/30/15 0327 08/31/15 0615 09/01/15 0610  WBC 22.5* 12.2* 13.4* 15.4* 15.8*  NEUTROABS 19.4*  --   --   --   --   HGB 7.2* 8.8* 9.2* 9.4* 9.0*  HCT 23.0* 27.5* 28.5* 28.7* 27.6*  MCV 93.1 92.9 91.1 90.3 91.7  PLT 175 158 169 210 196   Cardiac Enzymes: No results for input(s): CKTOTAL, CKMB, CKMBINDEX, TROPONINI in the last 168 hours. BNP: Invalid input(s): POCBNP CBG:  Recent Labs Lab 08/31/15 2003 09/01/15 0011 09/01/15 0400 09/01/15 0720 09/01/15 1402  GLUCAP 194* 110* 228* 133* 94    No results found for this or any previous visit (from the past 240 hour(s)).  Studies:              All Imaging reviewed and is as per above notation   Scheduled Meds: . sodium chloride   Intravenous Once  . carvedilol  10 mg Oral Daily  . darbepoetin (ARANESP) injection - DIALYSIS  150 mcg Intravenous Q Tue-HD  . doxercalciferol  1 mcg Intravenous Q T,Th,Sa-HD  . insulin aspart  0-15 Units Subcutaneous Q4H  . insulin glargine  2 Units Subcutaneous QHS  . levothyroxine  250 mcg Oral QAC breakfast  . midodrine  10 mg Oral TID WC  . multivitamin  1 tablet Oral QHS  . pneumococcal 23 valent vaccine  0.5 mL Intramuscular Tomorrow-1000  . pravastatin  40 mg Oral q1800  . sodium chloride  3 mL Intravenous Q12H   Continuous Infusions: . 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 Leukocytosis likely secondary to low-grade infection.  Appreciate  Palliative input .  transfused 10/14. Hemoglobin has remained stable in the 9 range.  keep on clear liquid diet.  I have attempted to graduate the diet to full liquids as of 10/18.  She has rallied some and is slightly stronger.  I suspect this will be a long period of recovery if she makes a recovery.   2. ESRD TTS-[facility] Neph[facility].  Continue Midrodrine 10 tid for BP support. TDC for removal.  Dry wght 65kg--now 66.2 kg 3. Afib + RVR, CHad2-Vasc2~4 [8%/yr risk] Cards s/o 10/11-continue prn metoprolol-cont Coreg 10 XL for rate control-Dig contraindicated in ESRD.  Currently rate is controlled  Around 120.  Coumadin held 2/2 ? risk GIB Re-started Iv heparin as hemoccults are neg and non-suggestive bloody stool.  Nursing to notify for severe abd pain, Dark stool or drop in HB 4. DM ty ii-change to Q4 hr at request of daughter who wishes [place] to check the cbg q4 despite being fed cld meals .CBG's low earlier, now up ? Lantus 2 units-->4 u 10/18. Blood sugars have stabilized 94-203 range range 5. Hypothyroidism Levothyroxine 250 mcg.  Large dose.  Close recheck TFT's as op 6. Mod-severe MAR/PAH-medical management-not candidate operative 7. AOCD/Renal disease-Aranesp q tues.  See above. Poor surgical candidate.  8. 2/2 hypoparathyroidism-cont doxercalcif 1 mcg tts.  Per renal 9. hld-cont prvachol 40 daily 10. Met encephalopathy family states that this is secondary to blood sugar fluctuations.  She has had spiking fevers   Code Status: Partial Code Family Communication: No family present at bedside.  Discussions are ongoing with palliative care--[place] HCPOA and Girlfriend not present at bedside but I did call Daughter-in-law [name] [phone] Disposition Plan:  unclear DVT prophylaxis:  heparin Consultants: renal, palliative  Verneita Griffes, MD  [facility] Pager [phone] 09/01/2015, 4:14 PM    LOS: 14 days

## 2015-09-01 NOTE — Progress Notes (Signed)
Daily Progress Note   Patient Name: Megan Vasquez       Date: 09/01/2015 DOB: 1941/06/12  Age: 74 y.o. MRN#: [id number] Attending Physician: Rhetta Mura, MD Primary Care Physician: [name], MD Admit Date: 29-Aug-2015  Reason for Consultation/Follow-up: GOC  Subjective:     Ms. Dunlow has recently returned from dialysis and nurses working with her when I came in. She appears tired. I asked her how she was feeling and about dialysis and abd pain. She is not very talkative and nods head or states yes/no. She has no questions or concerns. I did speak with her daughter in law, [name], who has many concerns. She has concerns that they keep feeling pressure to make decisions when decisions have already been made. She confirms that Ms. Sorto does NOT want surgery and does not want to be intubated but was clear that she was not ready to die (acknowledging that we may not be able to prevent this) and wanted to continue all other treatments including dialysis. Life support is a difficult subject as she had a son die a couple years ago after being on life support a week and this brings back a lot of memories for them as a family. I will follow for support and readiness for further discussion. For now we will take one day at a time.    Length of Stay: 14 days  Current Medications: Scheduled Meds:  . sodium chloride   Intravenous Once  . carvedilol  10 mg Oral Daily  . darbepoetin (ARANESP) injection - DIALYSIS  150 mcg Intravenous Q Tue-HD  . doxercalciferol  1 mcg Intravenous Q T,Th,Sa-HD  . insulin aspart  0-15 Units Subcutaneous Q4H  . insulin glargine  2 Units Subcutaneous QHS  . levothyroxine  250 mcg Oral QAC breakfast  . midodrine  10 mg Oral TID WC  . multivitamin  1 tablet Oral QHS  . pneumococcal 23 valent vaccine  0.5 mL Intramuscular Tomorrow-1000  . pravastatin  40 mg Oral q1800  . sodium chloride  3 mL Intravenous Q12H    Continuous Infusions: . heparin  1,000 Units/hr (09/01/15 1353)    PRN Meds: sodium chloride, acetaminophen, metoprolol, ondansetron **OR** ondansetron (ZOFRAN) IV, sodium chloride, sodium chloride  Palliative Performance Scale: 30%     Vital Signs: BP 129/72 mmHg  Pulse 117  Temp(Src) 97.8 F (36.6 C) (Oral)  Resp 21  Ht  (1.702 m)  Wt 66.2 kg (145 lb 15.1 oz)  BMI 22.85 kg/m2  SpO2 79%  Intake/output summary:  Intake/Output Summary (Last 24 hours) at 09/01/15 1537 Last data filed at 09/01/15 1233  Gross per 24 hour  Intake    600 ml  Output   3012 ml  Net  -2412 ml   LBM: Last BM Date: 09/01/15 Baseline Weight: Weight: 64.3 kg (141 lb 12.1 oz) Most recent weight: Weight: 66.2 kg (145 lb 15.1 oz)  Physical Exam: General: NAD, sleepy HEENT: [place]/AT CVS: Tachy Resp: No labored breathing Abd: Soft, ND, tender to palpation, +BS Neuro: Delayed response   Additional Data Reviewed: Recent Labs     08/31/15  0615  09/01/15  0610  WBC  15.4*  15.8*  HGB  9.4*  9.0*  PLT  210  196  NA  128*  128*  BUN  29*  17  CREATININE  4.77*  3.65*     Problem List:  Patient Active Problem List   Diagnosis Date Noted  . Palliative care encounter   .  Hypotension   . Ischemia, bowel (HCC)   . Pneumatosis coli   . Acute respiratory failure with hypoxia (HCC)   . Hemodialysis catheter dysfunction (HCC)   . Sepsis (HCC) 08/21/2015  . Fever, unspecified 08/21/2015  . Leukocytosis 08/21/2015  . PAD (peripheral artery disease) (HCC) 08/21/2015  . Right foot pain 08/21/2015  . RLQ abdominal pain 03-11-15  . Thrombocytopenia (HCC) 03-11-15  . Anemia, chronic renal failure 03-11-15  . Hypothyroidism 03-11-15  . Metabolic encephalopathy 03-11-15  . Severe mitral regurgitation 10/15/2014  . Coronary artery disease 10/18/2013  . Peripheral arterial disease (HCC) 10/18/2013  . Diabetes mellitus, insulin dependent (IDDM), controlled (HCC) 06/10/2013  . HTN (hypertension) 06/10/2013  . CKD  (chronic kidney disease) stage V requiring chronic dialysis (HCC) 06/10/2013  . HLD (hyperlipidemia) 06/10/2013  . Atrial fibrillation with RVR (HCC) 02/01/2013  . Long term current use of anticoagulant therapy 02/01/2013     Palliative Care Assessment & Plan    Code Status:  Partial - no intubation. This will need to be revisited at some point.   Goals of Care:  Continue all therapy short of surgery and intubation for hopes of some improvement.   3. Symptom Management:  Pain: Continue tylenol prn.   4. Prognosis: Prognosis is certainly guarded with debilitation and questionable ability to continue HD outpatient and depending on progress of ischemic bowel and response to medical management.  5. Discharge Planning: To be determined.    Care plan was discussed with Dr. Mahala MenghiniSamtani.   Thank you for allowing the Palliative Medicine Team to assist in the care of this patient.   Time In: 1500 Time Out: 1530 Total Time 30min Prolonged Time Billed  no     Greater than 50%  of this time was spent counseling and coordinating care related to the above assessment and plan.   Yong ChannelAlicia Riyana Biel, NP Palliative Medicine Team Pager # [phone] (M-F 8a-5p) Team Phone # [phone] (Nights/Weekends)  09/01/2015, 3:37 PM

## 2015-09-01 NOTE — Clinical Social Work Note (Signed)
Clinical Social Worker continuing to follow patient and family for support and discharge planning needs.  Patient continues to be unable to sit up for a fully dialysis treatment.  For patient to participate in outpatient dialysis she must be able to sit up for full treatment.  CM aware and working with [facility] as a potential option for placement unitl patient is able to sit up.  CSW remains available for support and to assist with discharge needs if determined appropriate.  Macario GoldsJesse Adelae Yodice, [place] [phone]

## 2015-09-01 NOTE — Progress Notes (Signed)
PT Cancellation Note  Patient Details Name: Megan MaudlinMarjorie Harmsen MRN: [id number] DOB: 01/10/1941   Cancelled Treatment:    Reason Eval/Treat Not Completed: Patient at procedure or test/unavailable.  Pt continues to be OOF at HD.  Will f/u tomorrow.     Jamere Stidham, Alison MurrayMegan F 09/01/2015, 1:04 PM

## 2015-09-01 NOTE — Progress Notes (Signed)
Subjective:  On hd no cos   Objective Vital signs in last 24 hours: Filed Vitals:   09/01/15 0900 09/01/15 0930 09/01/15 1000 09/01/15 1030  BP: 98/53 110/63 114/61 104/57  Pulse: 103 106 107 104  Temp:      TempSrc:      Resp: 19 20 23 26   Height:      Weight:      SpO2:       Weight change: 0.851 kg (1 lb 14 oz)  Physical Exam: General: thin chronically appearing  BF ,NAD soft spoken , confused to date, knows she is at "[facility].'' Heart: Irreg . irreg no rub  Lungs: CTA  bilat Abdomen: BS pos, soft , nontender and nondistended Extremities: Dialysis Access: LUA avf patent on hd   Dialysis Orders: TTS NW 4 hrs 65.5kgs 2K/2Ca L AVF 1000u hep hectorol 2 venofor 100 q HD until 10/8 then 50 q week Micera 100 q2 weeks - to start 10/6  Problem/Plan: 1 ESRD s/p CRRT. Using LUE AVF 2 Hypotension, improved on midodrine 3 Vol excess -  3kg over today by wt  4 Colitis / abnl CT - suspected ischemic, better, no abx; not surg candidate 5 Perm afib on coreg/ anticoag restarted 6 hx CABG 7 Anemia cont darbe 8 MBD low P,no binders for now/ cont hect 9 Malnutrition alb 1's 10 Debility major issue- seems to be doing better, sat up in side of bed, eating some now per family 11 Partial code- palliative care seeing    Lenny Pastelavid Zeyfang, PA-C [facility] [phone] 09/01/2015,10:55 AM  LOS: 14 days    Pt seen, examined and agree w A/P as above.  Vinson Moselleob Viviana Trimble MD [facility] pager [phone]    cell [phone] 09/01/2015, 11:29 AM      Labs: Basic Metabolic Panel:  Recent Labs Lab 08/26/15 0433 08/26/15 2024  08/30/15 0327 08/31/15 0615 09/01/15 0610  NA 134* 132*  < > 132* 128* 128*  K 3.9 4.7  < > 4.6 4.6 3.9  CL 99* 99*  < > 96* 95* 93*  CO2 27 26  < > 27 24 25   GLUCOSE 122* 259*  < > 127* 120* 203*  BUN 8 9  < > 22* 29* 17  CREATININE 1.23* 2.06*  < > 3.86* 4.77* 3.65*  CALCIUM 7.6* 7.9*  < > 7.8* 7.4* 7.3*  PHOS 2.0*  1.6*  --  2.8  --   --   < > = values in this interval not displayed. Liver Function Tests:  Recent Labs Lab 08/26/15 0433 08/26/15 2024 08/30/15 0327  ALBUMIN 1.5* 1.5* 1.4*   No results for input(s): LIPASE, AMYLASE in the last 168 hours. No results for input(s): AMMONIA in the last 168 hours. CBC:  Recent Labs Lab 08/28/15 1413 08/29/15 0318 08/30/15 0327 08/31/15 0615 09/01/15 0610  WBC 22.5* 12.2* 13.4* 15.4* 15.8*  NEUTROABS 19.4*  --   --   --   --   HGB 7.2* 8.8* 9.2* 9.4* 9.0*  HCT 23.0* 27.5* 28.5* 28.7* 27.6*  MCV 93.1 92.9 91.1 90.3 91.7  PLT 175 158 169 210 196   Cardiac Enzymes: No results for input(s): CKTOTAL, CKMB, CKMBINDEX, TROPONINI in the last 168 hours. CBG:  Recent Labs Lab 08/31/15 1636 08/31/15 2003 09/01/15 0011 09/01/15 0400 09/01/15 0720  GLUCAP 153* 194* 110* 228* 133*    Studies/Results: No results found. Medications: . heparin     . sodium chloride   Intravenous  Once  . carvedilol  10 mg Oral Daily  . darbepoetin (ARANESP) injection - DIALYSIS  150 mcg Intravenous Q Tue-HD  . doxercalciferol  1 mcg Intravenous Q T,Th,Sa-HD  . insulin aspart  0-15 Units Subcutaneous Q4H  . insulin glargine  2 Units Subcutaneous QHS  . levothyroxine  250 mcg Oral QAC breakfast  . midodrine  10 mg Oral TID WC  . multivitamin  1 tablet Oral QHS  . pneumococcal 23 valent vaccine  0.5 mL Intramuscular Tomorrow-1000  . pravastatin  40 mg Oral q1800  . sodium chloride  3 mL Intravenous Q12H     '

## 2015-09-01 NOTE — Progress Notes (Signed)
PHARMACY CONSULT NOTE   Pharmacy Consult for :  Heparin Indication:  VTE Treatment, embolic events to cecum and colon  Heparin Dosing weight 69 kg  Labs:  Recent Labs  08/30/15 0327 08/31/15 0615 09/01/15 0610 09/01/15 1942  HGB 9.2* 9.4* 9.0*  --   HCT 28.5* 28.7* 27.6*  --   PLT 169 210 196  --   HEPARINUNFRC  --   --   --  0.27*  CREATININE 3.86* 4.77* 3.65*  --    Estimated Creatinine Clearance: 12.8 mL/min (by C-G formula based on Cr of 3.65).  Current Infusion[s]: Infusions:  . heparin 1,000 Units/hr (09/01/15 1353)   Assessment:  Patient on Heparin infusion for treatment of embolic events to cecum and colon in place of Coumadin which is on hold for possible surgery.  Heparin infusing at 1000 units/hr.  Heparin level sub-therapeutic, 0.27 units/ml. No evidence of bleeding complications noted   Last FOB results negative.  Goal of Therapy:   Heparin level 0.3-0.7 units/ml    Plan:  1. Increase Heparin infusion to 1150 units/hr. 2. Continue Daily Heparin Levels, Platelet counts, CBC.  Monitor for bleeding complications   Laurena BeringStramoski, Graden Hoshino J,  Pharm.D  09/01/2015, 8:27 PM

## 2015-09-01 NOTE — Progress Notes (Signed)
PT Cancellation Note  Patient Details Name: Fredirick MaudlinMarjorie Riel MRN: [id number] DOB: 10/31/1941   Cancelled Treatment:    Reason Eval/Treat Not Completed: Patient at procedure or test/unavailable.  Pt currently at HD.  Will f/u another time.   Lexys Milliner, Alison MurrayMegan F 09/01/2015, 8:41 AM

## 2015-09-01 NOTE — Progress Notes (Signed)
ANTICOAGULATION CONSULT NOTE - Initial Consult  Pharmacy Consult for heparin Indication: VTE treatment  No Known Allergies  Patient Measurements: Height: 5\' 7"  (170.2 cm) Weight: 153 lb 14.1 oz (69.8 kg) IBW/kg (Calculated) : 61.6 Heparin Dosing Weight: 69kg  Vital Signs: Temp: 100.5 F (38.1 C) (10/18 0825) Temp Source: Oral (10/18 0825) BP: 114/61 mmHg (10/18 1000) Pulse Rate: 107 (10/18 1000)  Labs:  Recent Labs  08/30/15 0327 08/31/15 0615 09/01/15 0610  HGB 9.2* 9.4* 9.0*  HCT 28.5* 28.7* 27.6*  PLT 169 210 196  CREATININE 3.86* 4.77* 3.65*    Estimated Creatinine Clearance: 12.8 mL/min (by C-G formula based on Cr of 3.65).  Assessment: 74 YOF on warfarin PTA for history of AFib. She presented with embolic events to cecum and colon and was deemed not to be a surgical candidate. Warfarin was restarted, then held d/t possible need for surgery. She was on heparin for a brief amount of time, however this was also discontinued d/t high risk of bleeding. GI saw her yesterday and stated if her fecal occult blood stool test was negative, anticoagulation could be restarted. The FOB was negative this morning, and pharmacy was asked to dose IV heparin. Hgb 9, plts 196- relatively stable over past few days. She is on Aranesp for anemia of chronic disease, and recently completed a course of IV iron.  Goal of Therapy:  Heparin level 0.3-0.7 units/ml Monitor platelets by anticoagulation protocol: Yes   Plan:  -start heparin at 1000 units/hr (had a therapeutic level on 1100, but will be more cautious) -heparin level in 8h -daily HL and CBC -follow very closely for s/s bleeding and any changes to plan as per GI, renal, palliative, or any other providers  Shaindy Reader D. Brittainy Bucker, PharmD, BCPS Clinical Pharmacist Pager: [phone] 09/01/2015 10:12 AM

## 2015-09-02 LAB — RENAL FUNCTION PANEL
ALBUMIN: 1.5 g/dL — AB (ref 3.5–5.0)
Anion gap: 12 (ref 5–15)
BUN: 12 mg/dL (ref 6–20)
CO2: 26 mmol/L (ref 22–32)
Calcium: 7.6 mg/dL — ABNORMAL LOW (ref 8.9–10.3)
Chloride: 96 mmol/L — ABNORMAL LOW (ref 101–111)
Creatinine, Ser: 3.06 mg/dL — ABNORMAL HIGH (ref 0.44–1.00)
GFR calc Af Amer: 16 mL/min — ABNORMAL LOW (ref >60)
GFR calc non Af Amer: 14 mL/min — ABNORMAL LOW (ref >60)
GLUCOSE: 136 mg/dL — AB (ref 65–99)
PHOSPHORUS: 2.2 mg/dL — AB (ref 2.5–4.6)
POTASSIUM: 4 mmol/L (ref 3.5–5.1)
Sodium: 134 mmol/L — ABNORMAL LOW (ref 135–145)

## 2015-09-02 LAB — CBC
HEMATOCRIT: 22.9 % — AB (ref 36.0–46.0)
Hemoglobin: 7.4 g/dL — ABNORMAL LOW (ref 12.0–15.0)
MCH: 29.7 pg (ref 26.0–34.0)
MCHC: 32.3 g/dL (ref 30.0–36.0)
MCV: 92 fL (ref 78.0–100.0)
Platelets: 166 10*3/uL (ref 150–400)
RBC: 2.49 MIL/uL — ABNORMAL LOW (ref 3.87–5.11)
RDW: 16 % — AB (ref 11.5–15.5)
WBC: 16.3 10*3/uL — ABNORMAL HIGH (ref 4.0–10.5)

## 2015-09-02 LAB — C DIFFICILE QUICK SCREEN W PCR REFLEX
C DIFFICLE (CDIFF) ANTIGEN: NEGATIVE
C Diff interpretation: NEGATIVE
C Diff toxin: NEGATIVE

## 2015-09-02 LAB — GLUCOSE, CAPILLARY
GLUCOSE-CAPILLARY: 117 mg/dL — AB (ref 65–99)
GLUCOSE-CAPILLARY: 143 mg/dL — AB (ref 65–99)
Glucose-Capillary: 98 mg/dL (ref 65–99)
Glucose-Capillary: 100 mg/dL — ABNORMAL HIGH (ref 65–99)
Glucose-Capillary: 113 mg/dL — ABNORMAL HIGH (ref 65–99)

## 2015-09-02 LAB — HEPARIN LEVEL (UNFRACTIONATED): Heparin Unfractionated: <0.1 IU/mL — ABNORMAL LOW (ref 0.30–0.70)

## 2015-09-02 LAB — OCCULT BLOOD X 1 CARD TO LAB, STOOL: FECAL OCCULT BLD: NEGATIVE

## 2015-09-02 MED ORDER — SALINE SPRAY 0.65 % NA SOLN
1.0000 | NASAL | Status: DC | PRN
  Filled 2015-09-02: qty 44

## 2015-09-02 MED ORDER — PIPERACILLIN-TAZOBACTAM IN DEX 2-0.25 GM/50ML IV SOLN
2.2500 g | Freq: Three times a day (TID) | INTRAVENOUS | Status: DC
  Administered 2015-09-02 – 2015-09-08 (×17): 2.25 g via INTRAVENOUS
  Filled 2015-09-02 (×21): qty 50

## 2015-09-02 MED ORDER — ACETAMINOPHEN 500 MG PO TABS
500.0000 mg | ORAL_TABLET | Freq: Once | ORAL | Status: AC
  Filled 2015-09-02: qty 1

## 2015-09-02 NOTE — Progress Notes (Signed)
Subjective:  No complaints  Objective Vital signs in last 24 hours: Filed Vitals:   09/01/15 1951 09/02/15 0426 09/02/15 0556 09/02/15 0745  BP: 101/49 120/51    Pulse: 109     Temp: 98 F (36.7 C) 102.8 F (39.3 C) 102.4 F (39.1 C) 100.9 F (38.3 C)  TempSrc: Oral Oral Oral Oral  Resp: 27 35    Height:      Weight:  67.5 kg (148 lb 13 oz)    SpO2: 100% 94%     Weight change: -3.4 kg (-7 lb 7.9 oz)  Physical Exam: General: thin chronically-ill appearing, awake but groggy, tries to make verbal response then fades off , no verbal response to simple questions Heart: Irreg . irreg no rub  Lungs: CTA  bilat Abdomen: BS pos, soft , nontender and nondistended Extremities: Dialysis Access: LUA avf patent on hd   Dialysis Orders: TTS NW 4 hrs 65.5kgs 2K/2Ca L AVF 1000u hep hectorol 2 venofor 100 q HD until 10/8 then 50 q week Micera 100 q2 weeks - to start 10/6  Problem/Plan: 1 Fevers / sepsis / ischemic colitis by CT - s/p 7day abx course; now febrile again 102 deg F; zosyn restarted 2 Hypotension - resolved, will taper midodrine 3 Vol exces -resolving, mild excess 4 Perm afib on coreg/ anticoag restarted 5 hx CABG 6 Anemia cont darbe 7 MBD low P,no binders for now/ cont hect 8 Malnutrition alb 1's 9 Debility major issue- is getting up to chair now w max assist  10 AMS - is not oriented and gives minimal responses to care providers; family told me she was speaking to them the other day and understood what was going on with her colon and wants everything done 11 Partial code- palliative care seeing    Vinson Moselleob Chelsia Serres MD [facility] pager [phone]    cell [phone] 09/02/2015, 12:43 PM   Labs: Basic Metabolic Panel:  Recent Labs Lab 08/26/15 2024  08/30/15 0327 08/31/15 0615 09/01/15 0610 09/02/15 0612  NA 132*  < > 132* 128* 128* 134*  K 4.7  < > 4.6 4.6 3.9 4.0  CL 99*  < > 96* 95* 93* 96*  CO2 26  < > 27 24 25 26   GLUCOSE 259*  < >  127* 120* 203* 136*  BUN 9  < > 22* 29* 17 12  CREATININE 2.06*  < > 3.86* 4.77* 3.65* 3.06*  CALCIUM 7.9*  < > 7.8* 7.4* 7.3* 7.6*  PHOS 1.6*  --  2.8  --   --  2.2*  < > = values in this interval not displayed. Liver Function Tests:  Recent Labs Lab 08/26/15 2024 08/30/15 0327 09/02/15 0612  ALBUMIN 1.5* 1.4* 1.5*   No results for input(s): LIPASE, AMYLASE in the last 168 hours. No results for input(s): AMMONIA in the last 168 hours. CBC:  Recent Labs Lab 08/28/15 1413 08/29/15 0318 08/30/15 0327 08/31/15 0615 09/01/15 0610 09/02/15 0612  WBC 22.5* 12.2* 13.4* 15.4* 15.8* 16.3*  NEUTROABS 19.4*  --   --   --   --   --   HGB 7.2* 8.8* 9.2* 9.4* 9.0* 7.4*  HCT 23.0* 27.5* 28.5* 28.7* 27.6* 22.9*  MCV 93.1 92.9 91.1 90.3 91.7 92.0  PLT 175 158 169 210 196 166   Cardiac Enzymes: No results for input(s): CKTOTAL, CKMB, CKMBINDEX, TROPONINI in the last 168 hours. CBG:  Recent Labs Lab 09/01/15 1928 09/01/15 2348 09/02/15 0356 09/02/15 [id number] 09/02/15 1132  GLUCAP 106* 179* 98 117* 143*    Studies/Results: No results found. Medications: . heparin 1,200 Units/hr (09/02/15 1100)   . sodium chloride   Intravenous Once  . carvedilol  10 mg Oral Daily  . darbepoetin (ARANESP) injection - DIALYSIS  150 mcg Intravenous Q Tue-HD  . doxercalciferol  1 mcg Intravenous Q T,Th,Sa-HD  . insulin aspart  0-15 Units Subcutaneous Q4H  . insulin glargine  4 Units Subcutaneous QHS  . levothyroxine  250 mcg Oral QAC breakfast  . midodrine  10 mg Oral TID WC  . multivitamin  1 tablet Oral QHS  . piperacillin-tazobactam (ZOSYN)  IV  2.25 g Intravenous 3 times per day  . pneumococcal 23 valent vaccine  0.5 mL Intramuscular Tomorrow-1000  . pravastatin  40 mg Oral q1800  . sodium chloride  3 mL Intravenous Q12H     '

## 2015-09-02 NOTE — Progress Notes (Signed)
PROGRESS NOTE  Megan Vasquez ZOX:[id number] DOB: 1940/12/29 DOA: 2015/08/19 PCP: [name], MD  HPI: 74 yo female patient chronic kidney disease on dialysis on Tuesday Thursday Saturday, chronic atrial fibrillation on warfarin and rate control, chronic kidney disease with anemia, mild baseline chronic thrombocytopenia (platelets are typically around 120,000), diabetes on insulin, hypertension, recent diagnosis of severe mitral regurgitation asymptomatic December 2015 per echocardiogram, hypothyroidism, dyslipidemia and known CAD. Patient was brought to the hospital today by her grandson because of altered mentation abrupt onset.  10/4 CT scan on admission with concerns for pneumatosis along the cecum and ascending colon at the right lower quadrant, of uncertain significance, with mild associated wall thickening and soft tissue inflammation, ?early necrotizing enterocolitis 10/8 patient progressively worsening with increased leukocytosis, hypotension and decreasing mentation. Transferred to ICU for CRRT 10/13 improving, transferred to [facility] 10/19 fever curve worsening, restarted antibiotics   Subjective / 24 H Interval events - has no complaints for me this morning - denies abdominal pain, denies nausea/vomiting.   Assessment/Plan: Principal Problem:   Atrial fibrillation with RVR (HCC) Active Problems:   Long term current use of anticoagulant therapy   Diabetes mellitus, insulin dependent (IDDM), controlled (HCC)   HTN (hypertension)   CKD (chronic kidney disease) stage V requiring chronic dialysis (HCC)   HLD (hyperlipidemia)   Coronary artery disease   Severe mitral regurgitation   RLQ abdominal pain   Thrombocytopenia (HCC)   Anemia, chronic renal failure   Hypothyroidism   Metabolic encephalopathy   Sepsis (HCC)   Fever, unspecified   Leukocytosis   PAD (peripheral artery disease) (HCC)   Right foot pain   Acute respiratory failure with hypoxia (HCC)  Hemodialysis catheter dysfunction (HCC)   Pneumatosis coli   Hypotension   Ischemia, bowel (HCC)   Palliative care encounter   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 Michaell Cowing note and on my evaluation today - it appears to be an ongoing issue, with high fever today   Acute blood loss anemia vs anemia of acute illness - possibly due to #1, however FOBT negative, possibly in the setting of febrile illness - transfused 2U pRBC 10/6 and 10/14, 2U FFP  Fever - ?due to #1 vs other source, she has no respiratory complaints - C diff negative - given high fever 102.8 and early septic appearance will start Zosyn.  ESRD  - nephrology following - Continue Midrodrine 10 tid for BP support - TDC for removal  Afib with RVR - CHad2-Vasc2~4 [8%/yr risk]  - continue Coreg - cardiology saw patient and s/o 10/11 - continue heparin, will transition to Coumadin if Hb stable  DM type 2 - change to Q4 hr at request of daughter who wishes [place] to check the cbg q4 d - stable CBGs  Hypothyroidism  - Levothyroxine 250 mcg  Mod-severe MV regurgitation / PAH - medical management-not candidate operative  HLD - cont prvachol 40 daily  Metabolic encephalopathy - mental status close to baseline today    Diet: Diet full liquid Room service appropriate?: Yes; Fluid consistency:: Thin Fluids: none DVT Prophylaxis: heparin  Code Status: Partial Code Family Communication: d/w daughter bedside  Disposition Plan: remain inpatient  Barriers to discharge: fever, anemia  Consultants:  Cardiology  Nephrology  General surgery  Palliative   Procedures:  None    Antibiotics 1. Vanc + Zosyn 10/4-->10/11 2. Zosyn 10/19 >>   Studies  No results found.  Objective  Filed Vitals:   09/01/15 1951 09/02/15 0426 09/02/15 0556 09/02/15 0745  BP: 101/49 120/51    Pulse: 109     Temp: 98 F (36.7  C) 102.8 F (39.3 C) 102.4 F (39.1 C) 100.9 F (38.3 C)  TempSrc: Oral Oral Oral Oral  Resp: 27 35    Height:      Weight:  67.5 kg (148 lb 13 oz)    SpO2: 100% 94%      Intake/Output Summary (Last 24 hours) at 09/02/15 1217 Last data filed at 09/02/15 1000  Gross per 24 hour  Intake 284.17 ml  Output   3012 ml  Net -2727.83 ml   Filed Weights   09/01/15 0825 09/01/15 1233 09/02/15 0426  Weight: 69.8 kg (153 lb 14.1 oz) 66.2 kg (145 lb 15.1 oz) 67.5 kg (148 lb 13 oz)   Exam:  GENERAL: NAD  HEENT: head NCAT, no scleral icterus.   NECK: Supple. No LAD  LUNGS: Clear to auscultation. No wheezing or crackles  HEART: Regular rate and rhythm without murmur. Decreased pulse on right LE, no JVD, no peripheral edema  ABDOMEN: Soft, non-distended, non-tender. Positive bowel sounds.  EXTREMITIES: Without any cyanosis or clubbing. Good muscle tone  NEUROLOGIC: non focal   Data Reviewed: Basic Metabolic Panel:  Recent Labs Lab 08/26/15 2024 08/27/15 0515 08/30/15 0327 08/31/15 0615 09/01/15 0610 09/02/15 0612  NA 132* 131* 132* 128* 128* 134*  K 4.7 4.4 4.6 4.6 3.9 4.0  CL 99* 98* 96* 95* 93* 96*  CO2 26 26 27 24 25 26   GLUCOSE 259* 140* 127* 120* 203* 136*  BUN 9 11 22* 29* 17 12  CREATININE 2.06* 2.73* 3.86* 4.77* 3.65* 3.06*  CALCIUM 7.9* 7.5* 7.8* 7.4* 7.3* 7.6*  PHOS 1.6*  --  2.8  --   --  2.2*   Liver Function Tests:  Recent Labs Lab 08/26/15 2024 08/30/15 0327 09/02/15 0612  ALBUMIN 1.5* 1.4* 1.5*   CBC:  Recent Labs Lab 08/28/15 1413 08/29/15 0318 08/30/15 0327 08/31/15 0615 09/01/15 0610 09/02/15 0612  WBC 22.5* 12.2* 13.4* 15.4* 15.8* 16.3*  NEUTROABS 19.4*  --   --   --   --   --   HGB 7.2* 8.8* 9.2* 9.4* 9.0* 7.4*  HCT 23.0* 27.5* 28.5* 28.7* 27.6* 22.9*  MCV 93.1 92.9 91.1 90.3 91.7 92.0  PLT 175 158 169 210 196 166   BNP (last 3 results)  Recent Labs  09/06/2015 1120  BNP 1556.0*   CBG:  Recent Labs Lab 09/01/15 1928  09/01/15 2348 09/02/15 0356 09/02/15 0738 09/02/15 1132  GLUCAP 106* 179* 98 117* 143*    Recent Results (from the past 240 hour(s))  C difficile quick scan w PCR reflex     Status: None   Collection Time: 09/02/15  7:54 AM  Result Value Ref Range Status   C Diff antigen NEGATIVE NEGATIVE Final   C Diff toxin NEGATIVE NEGATIVE Final   C Diff interpretation Negative for toxigenic C. difficile  Final     Scheduled Meds: . sodium chloride   Intravenous Once  . carvedilol  10 mg Oral Daily  . darbepoetin (ARANESP) injection - DIALYSIS  150 mcg Intravenous Q Tue-HD  . doxercalciferol  1 mcg Intravenous Q T,Th,Sa-HD  . insulin aspart  0-15 Units Subcutaneous Q4H  . insulin glargine  4 Units Subcutaneous QHS  . levothyroxine  250 mcg Oral QAC breakfast  . midodrine  10 mg Oral TID WC  . multivitamin  1 tablet Oral QHS  . piperacillin-tazobactam (ZOSYN)  IV  2.25 g Intravenous 3 times per day  . pneumococcal 23 valent vaccine  0.5 mL Intramuscular Tomorrow-1000  . pravastatin  40 mg Oral q1800  . sodium chloride  3 mL Intravenous Q12H   Continuous Infusions: . heparin 1,200 Units/hr (09/02/15 1100)   Pamella Pert, MD [facility] Pager [phone]. If 7 PM - 7 AM, please contact night-coverage at [email or web address], password [facility] 09/02/2015, 12:17 PM  LOS: 15 days

## 2015-09-02 NOTE — Care Management Note (Addendum)
Case Management Note  Patient Details  Name: Megan MaudlinMarjorie Vasquez MRN: [id number] Date of Birth: 09/19/1941  Subjective/Objective:  Pt initially admitted for Afib RVR. Hx chronic kidney disease on dialysis, chronic atrial fibrillation on warfarin and rate control, chronic kidney disease with anemia. Question early necrotizing enterocolitis. On 10/19  Pt with fever curve worsening, restarted antibiotics. IV Zosyn.                    Action/Plan: Palliative Care is following the case. Pt's family wants to continue with aggressive care per Palliative Care. Options would be SNF with Palliative Care if pt will be able to sit up for HD treatments. So far pt only able to sit up at least 2.5 hours so far. Next option would be [facility]idential Hosp[facility]. CM did touch base with [facility]- they would have to look at long term plan-as of right now not an option. CM will continue to monitor for additional needs.    Expected Discharge Date:                  Expected Discharge Plan:  Home/Self Care  In-House Referral:     Discharge planning Services  CM Consult  Post Acute Care Choice:    Choice offered to:     DME Arranged:    DME Agency:     HH Arranged:    HH Agency:     Status of Service:  In process, will continue to follow  Medicare Important Message Given:  Yes-fourth notification given Date Medicare IM Given:    Medicare IM give by:    Date Additional Medicare IM Given:    Additional Medicare Important Message give by:     If discussed at Long Length of Stay Meetings, dates discussed:    Additional Comments:  1450 09-04-15 Megan BambergerBrenda [name], Megan Vasquez,Megan Vasquez [phone] CM did speak with Liaison for [facility]- still awaiting insurance authorization. CM will continue to monitor.    [address]1521 Megan BambergerBrenda [name], Megan Vasquez,Megan Vasquez [phone] CM did discuss pt in the [facility] meeting. CM did speak with Megan Vasquez Liaison for [facility] and he has a dialysis bed available. Per Arlys JohnBrian he will send to  insurance to see if approved. CM [facility] and [facility] to [facility] to [facility]. Pt is in HD at this time. Unable to speak with her in ref to possible transition of care. CM will continue to monitor for disposition needs.   Gala LewandowskyGraves-Bigelow, [name], Megan Vasquez 09/02/2015, 4:15 PM

## 2015-09-02 NOTE — Progress Notes (Signed)
Daily Progress Note   Patient Name: Megan Vasquez       Date: 09/02/2015 DOB: 16-Nov-1940  Age: 74 y.o. MRN#: [id number] Attending Physician: Leatha Gilding, MD Primary Care Physician: [name], MD Admit Date: 09/04/2015  Reason for Consultation/Follow-up: Establishing goals of care  Subjective:     Megan Vasquez is surrounded by her daughter Daivd Council partner, and grandson. They are very concerned with her fever and Rosey Bath is concerned about her foot pain and this causing infection - testing for C. diff. Fear this may be from bowel ischemia. I reassured them that we are keeping an eye on her feet and monitoring for any signs/sources of infection. They are very optimistic and hopeful for improvement and I encouraged them to take just one day at a time. Briefly discussed barriers to improvement. I told them that we will respect her wishes for no surgery but to do everything we can as she is "not ready to die." Megan Vasquez has a very flat affect and does not engage in conversation with me. She does listen but does not interject without prompting and then it is usually just 1-2 words. I did not press them on decisions and poor prognosis today as they have expressed they are have heard this and are frustrated with continued conversation so I will follow for support and attempt to build rapport as further decline will be very difficult for them all.    Length of Stay: 15 days  Current Medications: Scheduled Meds:  . sodium chloride   Intravenous Once  . carvedilol  10 mg Oral Daily  . darbepoetin (ARANESP) injection - DIALYSIS  150 mcg Intravenous Q Tue-HD  . doxercalciferol  1 mcg Intravenous Q T,Th,Sa-HD  . insulin aspart  0-15 Units Subcutaneous Q4H  . insulin glargine  4 Units Subcutaneous QHS  . levothyroxine  250 mcg Oral QAC breakfast  . midodrine  10 mg Oral TID WC  . multivitamin  1 tablet Oral QHS  . pneumococcal 23 valent vaccine  0.5 mL Intramuscular  Tomorrow-1000  . pravastatin  40 mg Oral q1800  . sodium chloride  3 mL Intravenous Q12H    Continuous Infusions: . heparin 1,200 Units/hr (09/02/15 0819)    PRN Meds: sodium chloride, acetaminophen, metoprolol, ondansetron **OR** ondansetron (ZOFRAN) IV, sodium chloride, sodium chloride  Palliative Performance Scale: 30%     Vital Signs: BP 120/51 mmHg  Pulse 109  Temp(Src) 100.9 F (38.3 C) (Oral)  Resp 35  Ht  (1.702 m)  Wt 67.5 kg (148 lb 13 oz)  BMI 23.30 kg/m2  SpO2 94% SpO2: SpO2: 94 % O2 Device: O2 Device: Not Delivered O2 Flow Rate: O2 Flow Rate (L/min): 2 L/min  Intake/output summary:  Intake/Output Summary (Last 24 hours) at 09/02/15 1125 Last data filed at 09/02/15 1000  Gross per 24 hour  Intake 284.17 ml  Output   3012 ml  Net -2727.83 ml   LBM: Last BM Date: 09/01/15 Baseline Weight: Weight: 64.3 kg (141 lb 12.1 oz) Most recent weight: Weight: 67.5 kg (148 lb 13 oz)  Physical Exam: General: NAD, lying in bed mostly with eyes closed HEENT: Temporal muscle wasting Resp: No labored breathing, room air Neuro: Sleepy, 1-2 word responses appropriate, appears confused to me although she will not engage for me to fully assess (family thinks she can make her own decisions)   Additional Data Reviewed: Recent Labs     09/01/15  0610  09/02/15  0612  WBC  15.8*  16.3*  HGB  9.0*  7.4*  PLT  196  166  NA  128*  134*  BUN  17  12  CREATININE  3.65*  3.06*     Problem List:  Patient Active Problem List   Diagnosis Date Noted  . Palliative care encounter   . Hypotension   . Ischemia, bowel (HCC)   . Pneumatosis coli   . Acute respiratory failure with hypoxia (HCC)   . Hemodialysis catheter dysfunction (HCC)   . Sepsis (HCC) 08/21/2015  . Fever, unspecified 08/21/2015  . Leukocytosis 08/21/2015  . PAD (peripheral artery disease) (HCC) 08/21/2015  . Right foot pain 08/21/2015  . RLQ abdominal pain 08/17/2015  . Thrombocytopenia (HCC)  08/21/2015  . Anemia, chronic renal failure 09/07/2015  . Hypothyroidism 09/01/2015  . Metabolic encephalopathy 09/06/2015  . Severe mitral regurgitation 10/15/2014  . Coronary artery disease 10/18/2013  . Peripheral arterial disease (HCC) 10/18/2013  . Diabetes mellitus, insulin dependent (IDDM), controlled (HCC) 06/10/2013  . HTN (hypertension) 06/10/2013  . CKD (chronic kidney disease) stage V requiring chronic dialysis (HCC) 06/10/2013  . HLD (hyperlipidemia) 06/10/2013  . Atrial fibrillation with RVR (HCC) 02/01/2013  . Long term current use of anticoagulant therapy 02/01/2013     Palliative Care Assessment & Plan    Code Status:  Limited code - no intubation  Goals of Care:  No surgery, no intubation but continue all other aggressive care including HD.   Desire for further Chaplaincy support:no  3. Symptom Management:  Pain: Continue tylenol prn.   4. Prognosis: Prognosis is certainly guarded with debilitation and questionable ability to continue HD outpatient and depending on progress of ischemic bowel and response to medical management.   5. Discharge Planning: To be determined.    Thank you for allowing the Palliative Medicine Team to assist in the care of this patient.   Time In: 1025 Time Out: 1050 Total Time 25min Prolonged Time Billed  no     Greater than 50%  of this time was spent counseling and coordinating care related to the above assessment and plan.   Yong ChannelAlicia Kristen Fromm, NP Palliative Medicine Team Pager # [phone] (M-F 8a-5p) Team Phone # [phone] (Nights/Weekends)  09/02/2015, 11:25 AM

## 2015-09-02 NOTE — Progress Notes (Signed)
ANTIBIOTIC CONSULT NOTE - INITIAL  Pharmacy Consult for Zosyn Indication: intra-abdominal infection  No Known Allergies  Patient Measurements: Height:  (170.2 cm) Weight: 148 lb 13 oz (67.5 kg) IBW/kg (Calculated) : 61.6  Vital Signs: Temp: 100.9 F (38.3 C) (10/19 0745) Temp Source: Oral (10/19 0745) BP: 120/51 mmHg (10/19 0426) Intake/Output from previous day: 10/18 0701 - 10/19 0700 In: 401.2 [P.O.:360; I.V.:41.2] Out: 3012  Intake/Output from this shift: Total I/O In: 3 [I.V.:3] Out: -   Labs:  Recent Labs  08/31/15 0615 09/01/15 0610 09/02/15 0612  WBC 15.4* 15.8* 16.3*  HGB 9.4* 9.0* 7.4*  PLT 210 196 166  CREATININE 4.77* 3.65* 3.06*   Estimated Creatinine Clearance: 15.2 mL/min (by C-G formula based on Cr of 3.06). No results for input(s): VANCOTROUGH, VANCOPEAK, VANCORANDOM, GENTTROUGH, GENTPEAK, GENTRANDOM, TOBRATROUGH, TOBRAPEAK, TOBRARND, AMIKACINPEAK, AMIKACINTROU, AMIKACIN in the last 72 hours.   Microbiology: Recent Results (from the past 720 hour(s))  MRSA PCR Screening     Status: None   Collection Time: 2015/08/27  9:05 AM  Result Value Ref Range Status   MRSA by PCR NEGATIVE NEGATIVE Final    Comment:        The GeneXpert MRSA Assay (FDA approved for NASAL specimens only), is one component of a comprehensive MRSA colonization surveillance program. It is not intended to diagnose MRSA infection nor to guide or monitor treatment for MRSA infections.   Urine culture     Status: None   Collection Time: 08/27/2015 12:27 PM  Result Value Ref Range Status   Specimen Description URINE, CLEAN CATCH  Final   Special Requests NONE  Final   Culture MULTIPLE SPECIES PRESENT, SUGGEST RECOLLECTION  Final   Report Status 08/19/2015 FINAL  Final  Culture, blood (routine x 2)     Status: None   Collection Time: Aug 27, 2015  8:20 PM  Result Value Ref Range Status   Specimen Description BLOOD RIGHT WRIST  Final   Special Requests IN PEDIATRIC BOTTLE  3CC  Final   Culture NO GROWTH 5 DAYS  Final   Report Status 08/23/2015 FINAL  Final  Culture, blood (routine x 2)     Status: None   Collection Time: 08/27/2015  8:30 PM  Result Value Ref Range Status   Specimen Description BLOOD RIGHT HAND  Final   Special Requests IN PEDIATRIC BOTTLE 3CC  Final   Culture NO GROWTH 5 DAYS  Final   Report Status 08/23/2015 FINAL  Final  Culture, blood (routine x 2)     Status: None   Collection Time: 08/21/15  7:50 AM  Result Value Ref Range Status   Specimen Description BLOOD HAND RIGHT  Final   Special Requests BOTTLES DRAWN AEROBIC AND ANAEROBIC 5CC  Final   Culture NO GROWTH 5 DAYS  Final   Report Status 08/26/2015 FINAL  Final  Culture, blood (routine x 2)     Status: None   Collection Time: 08/21/15  8:00 AM  Result Value Ref Range Status   Specimen Description BLOOD HAND RIGHT  Final   Special Requests BOTTLES DRAWN AEROBIC ONLY 10CC  Final   Culture NO GROWTH 5 DAYS  Final   Report Status 08/26/2015 FINAL  Final  MRSA PCR Screening     Status: None   Collection Time: 08/22/15  3:26 PM  Result Value Ref Range Status   MRSA by PCR NEGATIVE NEGATIVE Final    Comment:        The GeneXpert MRSA Assay (FDA approved  for NASAL specimens only), is one component of a comprehensive MRSA colonization surveillance program. It is not intended to diagnose MRSA infection nor to guide or monitor treatment for MRSA infections.   C difficile quick scan w PCR reflex     Status: None   Collection Time: 09/02/15  7:54 AM  Result Value Ref Range Status   C Diff antigen NEGATIVE NEGATIVE Final   C Diff toxin NEGATIVE NEGATIVE Final   C Diff interpretation Negative for toxigenic C. difficile  Final    Medical History: Past Medical History  Diagnosis Date  . Diabetes mellitus without complication (HCC)   . Hypertension   . Hyperlipidemia   . Atrial fibrillation (HCC)   . ESRD (end stage renal disease) (HCC)     HD since 06/2000  . CAD (coronary  artery disease)   . 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 History of nuclear stress test 10/2012    lexiscan; normal stress test; post-stress EF72%  . Peripheral arterial disease (HCC)   . Osteoporosis   . Mitral regurgitation     severe by 2-D echocardiogram performed 10/22/14    Medications:  Scheduled:  . sodium chloride   Intravenous Once  . carvedilol  10 mg Oral Daily  . darbepoetin (ARANESP) injection - DIALYSIS  150 mcg Intravenous Q Tue-HD  . doxercalciferol  1 mcg Intravenous Q T,Th,Sa-HD  . insulin aspart  0-15 Units Subcutaneous Q4H  . insulin glargine  4 Units Subcutaneous QHS  . levothyroxine  250 mcg Oral QAC breakfast  . midodrine  10 mg Oral TID WC  . multivitamin  1 tablet Oral QHS  . pneumococcal 23 valent vaccine  0.5 mL Intramuscular Tomorrow-1000  . pravastatin  40 mg Oral q1800  . sodium chloride  3 mL Intravenous Q12H   Infusions:  . heparin 1,200 Units/hr (09/02/15 [id number])   Assessment: 74 yo F with ESRD presented with RLQ abdominal pain, found to have pneumatosis/infarct to colon.  Pt has continued to have low grade fevers.  All cx negative to date.  To start Zosyn for suspected intra-abdominal infection.  Goal of Therapy:  Renal dose adjustment of antibiotics  Plan:  Zosyn 2.25gm IV q8h  Toys 'R' UsKimberly Taeler Winning, Pharm.D., BCPS Clinical Pharmacist Pager [phone] 09/02/2015 11:26 AM

## 2015-09-02 NOTE — Progress Notes (Signed)
ANTICOAGULATION CONSULT NOTE - Follow-up Consult  Pharmacy Consult for heparin Indication: VTE treatment  No Known Allergies  Patient Measurements: Height: 5\' 7"  (170.2 cm) Weight: 148 lb 13 oz (67.5 kg) IBW/kg (Calculated) : 61.6 Heparin Dosing Weight: 69kg  Vital Signs: Temp: 102.4 F (39.1 C) (10/19 0556) Temp Source: Oral (10/19 0556) BP: 120/51 mmHg (10/19 0426) Pulse Rate: 109 (10/18 1951)  Labs:  Recent Labs  08/31/15 0615 09/01/15 0610 09/01/15 1942 09/02/15 0612  HGB 9.4* 9.0*  --  7.4*  HCT 28.7* 27.6*  --  22.9*  PLT 210 196  --  166  HEPARINUNFRC  --   --  0.27* <0.10*  CREATININE 4.77* 3.65*  --  3.06*    Estimated Creatinine Clearance: 15.2 mL/min (by C-G formula based on Cr of 3.06).  Assessment: 74 YOF on warfarin PTA for history of AFib. She presented with embolic events to cecum and colon and was deemed not to be a surgical candidate. Warfarin was restarted, then held d/t possible need for surgery. GI okayed restarting anticoagulation as fecal occult blood test was negative. Heparin level undetectable this morning on 1000 units/hr. **Heparin rate change not entered by pharmacist last night for subtherapeutic level**. Pt has very limited access so they are holding heparin for 20 minutes and then drawing level from same line. Hgb down to 7.4, plt down to 166. No overt bleeding per RN.  Goal of Therapy:  Heparin level 0.3-0.7 units/ml Monitor platelets by anticoagulation protocol: Yes   Plan:  -Increase heparin to 1200 units/hr -heparin level in 8h -follow very closely for s/s bleeding and risk vs benefit of continuing heparin gtt with dropping Hgb  Christoper Fabianaron Alwin Lanigan, PharmD, BCPS Clinical pharmacist, pager [phone] 09/02/2015 7:41 AM

## 2015-09-02 NOTE — Progress Notes (Signed)
NP notified of pt Temp 102.4 after 650mg  Tylenol admin at 0430. Also reported 3+ loose stools during the shift. Specimens obtained. Will wait for further orders and continue to monitor closely.

## 2015-09-02 NOTE — Progress Notes (Addendum)
Pt observed to have frequent elevations in heart rate above baseline while patient stationary in bed. Vitals taken. Pt has temperature 102.8. Tylenol will be administered. NP notified of pt change. Will reassess and continue to monitor closely.

## 2015-09-02 NOTE — Progress Notes (Signed)
Physical Therapy Treatment Patient Details Name: Megan MaudlinMarjorie Vasquez MRN: [id number] DOB: 04/16/1941 Today's Date: 09/02/2015    History of Present Illness pt presents with colitis and abdominal pain.  pt with hx of ESRD, A-fib, CKD, DM, HTN, CVA, and CABG.    PT Comments    Ms. Pettengill continues to require +2 max assist for all mobility and only occasionally responds to yes/no questions.  Per RN, pt has been most responsive to her family.  Pt will benefit from continued skilled PT services to increase functional independence and safety.   Follow Up Recommendations  SNF     Equipment Recommendations  None recommended by PT    Recommendations for Other Services       Precautions / Restrictions Precautions Precautions: Fall Restrictions Weight Bearing Restrictions: No    Mobility  Bed Mobility Overal bed mobility: Needs Assistance;+2 for physical assistance Bed Mobility: Supine to Sit     Supine to sit: Max assist;+2 for physical assistance     General bed mobility comments: pt did seem to attempt to move LEs when asked, but needed A for all aspects of bed mobility. Bed pad utilized to rotate pelvis and scoot to EOB.  Pt w/ tendency to lean to the Rt but able to push up straight inconsistently w/ tactile and verbal cues.  Transfers Overall transfer level: Needs assistance Equipment used: 2 person hand held assist Transfers: Stand Pivot Transfers;Sit to/from Stand Sit to Stand: Max assist;+2 physical assistance Stand pivot transfers: Max assist;+2 physical assistance       General transfer comment: Pt did not pivot her feet to turn to sit in chair and required max +2 assist to direct body to chair.  She assist ~25% w/ sit>stand but remained in trunk flexion throughout stand pivot.  Ambulation/Gait                 Stairs            Wheelchair Mobility    Modified Rankin (Stroke Patients Only)       Balance Overall balance assessment: Needs  assistance Sitting-balance support: Bilateral upper extremity supported;Feet supported Sitting balance-Leahy Scale: Poor   Postural control: Right lateral lean Standing balance support: Bilateral upper extremity supported;During functional activity Standing balance-Leahy Scale: Zero                      Cognition Arousal/Alertness: Awake/alert Behavior During Therapy: Flat affect Overall Cognitive Status: No family/caregiver present to determine baseline cognitive functioning Area of Impairment: Orientation;Attention;Memory;Following commands;Safety/judgement;Awareness;Problem solving Orientation Level: Disoriented to;Situation;Time;Place;Person Current Attention Level: Sustained Memory: Decreased short-term memory Following Commands: Follows one step commands inconsistently Safety/Judgement: Decreased awareness of safety;Decreased awareness of deficits Awareness: Intellectual Problem Solving: Slow processing;Decreased initiation;Difficulty sequencing;Requires verbal cues;Requires tactile cues General Comments: No family to determine baseline level of cognition.      Exercises      General Comments        Pertinent Vitals/Pain Pain Assessment: Faces Faces Pain Scale: Hurts little more Pain Location: pt grimaces, but does not state where pain is located when asked.   Pain Descriptors / Indicators: Grimacing Pain Intervention(s): Repositioned;Monitored during session    Home Living                      Prior Function            PT Goals (current goals can now be found in the care plan section) Acute Rehab PT Goals Patient Stated Goal:  pt did not state. PT Goal Formulation: Patient unable to participate in goal setting Time For Goal Achievement: 09-12-15 Potential to Achieve Goals: Good Progress towards PT goals: Not progressing toward goals - comment (Pt at same level of function as previous PT session)    Frequency  Min 2X/week    PT Plan  Current plan remains appropriate    Co-evaluation             End of Session Equipment Utilized During Treatment: Gait belt Activity Tolerance: Patient limited by fatigue Patient left: in chair;with call bell/phone within reach;with chair alarm set;with nursing/sitter in room     Time: 1610-9604 PT Time Calculation (min) (ACUTE ONLY): 25 min  Charges:  $Therapeutic Activity: 23-37 mins                    G Codes:      Michail Jewels PT, DPT [phone] Pager: [phone] 09/02/2015, 1:57 PM

## 2015-09-03 ENCOUNTER — Inpatient Hospital Stay (HOSPITAL_COMMUNITY): Payer: Medicare PPO

## 2015-09-03 DIAGNOSIS — L8990 Pressure ulcer of unspecified site, unspecified stage: Secondary | ICD-10-CM | POA: Insufficient documentation

## 2015-09-03 DIAGNOSIS — I959 Hypotension, unspecified: Secondary | ICD-10-CM

## 2015-09-03 LAB — IRON AND TIBC
Iron: 21 ug/dL — ABNORMAL LOW (ref 28–170)
Saturation Ratios: 16 % (ref 10.4–31.8)
TIBC: 130 ug/dL — ABNORMAL LOW (ref 250–450)
UIBC: 109 ug/dL

## 2015-09-03 LAB — HEPARIN LEVEL (UNFRACTIONATED)
HEPARIN UNFRACTIONATED: 1.06 [IU]/mL — AB (ref 0.30–0.70)
Heparin Unfractionated: 2.01 IU/mL — ABNORMAL HIGH (ref 0.30–0.70)

## 2015-09-03 LAB — GLUCOSE, CAPILLARY
GLUCOSE-CAPILLARY: 110 mg/dL — AB (ref 65–99)
GLUCOSE-CAPILLARY: 130 mg/dL — AB (ref 65–99)
GLUCOSE-CAPILLARY: 57 mg/dL — AB (ref 65–99)
GLUCOSE-CAPILLARY: 78 mg/dL (ref 65–99)
Glucose-Capillary: 52 mg/dL — ABNORMAL LOW (ref 65–99)
Glucose-Capillary: 68 mg/dL (ref 65–99)
Glucose-Capillary: 108 mg/dL — ABNORMAL HIGH (ref 65–99)
Glucose-Capillary: 112 mg/dL — ABNORMAL HIGH (ref 65–99)
Glucose-Capillary: 79 mg/dL (ref 65–99)

## 2015-09-03 LAB — CBC
HEMATOCRIT: 25.1 % — AB (ref 36.0–46.0)
Hemoglobin: 7.8 g/dL — ABNORMAL LOW (ref 12.0–15.0)
MCH: 28.8 pg (ref 26.0–34.0)
MCHC: 31.1 g/dL (ref 30.0–36.0)
MCV: 92.6 fL (ref 78.0–100.0)
Platelets: 209 10*3/uL (ref 150–400)
RBC: 2.71 MIL/uL — AB (ref 3.87–5.11)
RDW: 16.3 % — AB (ref 11.5–15.5)
WBC: 20.5 10*3/uL — AB (ref 4.0–10.5)

## 2015-09-03 LAB — BASIC METABOLIC PANEL
ANION GAP: 9 (ref 5–15)
BUN: 22 mg/dL — ABNORMAL HIGH (ref 6–20)
CALCIUM: 7.8 mg/dL — AB (ref 8.9–10.3)
CHLORIDE: 99 mmol/L — AB (ref 101–111)
CO2: 25 mmol/L (ref 22–32)
Creatinine, Ser: 4.5 mg/dL — ABNORMAL HIGH (ref 0.44–1.00)
GFR calc non Af Amer: 9 mL/min — ABNORMAL LOW (ref >60)
GFR, EST AFRICAN AMERICAN: 10 mL/min — AB (ref >60)
Glucose, Bld: 64 mg/dL — ABNORMAL LOW (ref 65–99)
POTASSIUM: 4.3 mmol/L (ref 3.5–5.1)
Sodium: 133 mmol/L — ABNORMAL LOW (ref 135–145)

## 2015-09-03 LAB — OCCULT BLOOD X 1 CARD TO LAB, STOOL: Fecal Occult Bld: NEGATIVE

## 2015-09-03 MED ORDER — ALTEPLASE 2 MG IJ SOLR
2.0000 mg | Freq: Once | INTRAMUSCULAR | Status: DC | PRN
  Filled 2015-09-03: qty 2

## 2015-09-03 MED ORDER — MIDODRINE HCL 5 MG PO TABS
5.0000 mg | ORAL_TABLET | Freq: Three times a day (TID) | ORAL | Status: DC
  Administered 2015-09-03 – 2015-09-06 (×8): 5 mg via ORAL
  Filled 2015-09-03 (×7): qty 1

## 2015-09-03 MED ORDER — LOPERAMIDE HCL 2 MG PO CAPS
2.0000 mg | ORAL_CAPSULE | ORAL | Status: DC | PRN
  Administered 2015-09-03 – 2015-09-05 (×2): 2 mg via ORAL
  Filled 2015-09-03 (×2): qty 1

## 2015-09-03 MED ORDER — PENTAFLUOROPROP-TETRAFLUOROETH EX AERO: 1.0000 "application " | INHALATION_SPRAY | CUTANEOUS | Status: DC | PRN

## 2015-09-03 MED ORDER — DOXERCALCIFEROL 4 MCG/2ML IV SOLN
INTRAVENOUS | Status: AC
  Filled 2015-09-03: qty 2

## 2015-09-03 MED ORDER — WARFARIN - PHARMACIST DOSING INPATIENT
Freq: Every day | Status: DC
  Administered 2015-09-04: 18:00:00

## 2015-09-03 MED ORDER — LIDOCAINE-PRILOCAINE 2.5-2.5 % EX CREA
1.0000 "application " | TOPICAL_CREAM | CUTANEOUS | Status: DC | PRN
  Filled 2015-09-03: qty 5

## 2015-09-03 MED ORDER — ENOXAPARIN SODIUM 60 MG/0.6ML ~~LOC~~ SOLN
50.0000 mg | SUBCUTANEOUS | Status: DC
  Administered 2015-09-03 – 2015-09-04 (×2): 50 mg via SUBCUTANEOUS
  Filled 2015-09-03 (×3): qty 0.6

## 2015-09-03 MED ORDER — ENOXAPARIN SODIUM 60 MG/0.6ML ~~LOC~~ SOLN
50.0000 mg | SUBCUTANEOUS | Status: DC
  Filled 2015-09-03: qty 0.6

## 2015-09-03 MED ORDER — SODIUM CHLORIDE 0.9 % IV SOLN: 100.0000 mL | INTRAVENOUS | Status: DC | PRN

## 2015-09-03 MED ORDER — HEPARIN SODIUM (PORCINE) 1000 UNIT/ML DIALYSIS: 1000.0000 [IU] | INTRAMUSCULAR | Status: DC | PRN

## 2015-09-03 MED ORDER — WARFARIN SODIUM 5 MG PO TABS: 6.0000 mg | ORAL_TABLET | Freq: Once | ORAL | Status: AC

## 2015-09-03 MED ORDER — LIDOCAINE HCL (PF) 1 % IJ SOLN: 5.0000 mL | INTRAMUSCULAR | Status: DC | PRN

## 2015-09-03 NOTE — Progress Notes (Signed)
CBG recheck is 108.  Will continue to monitor.  [name], Drayson Dorko Gray

## 2015-09-03 NOTE — Progress Notes (Signed)
ANTICOAGULATION CONSULT NOTE   Pharmacy Consult for heparin Indication: Atrial fibrillation  No Known Allergies  Patient Measurements: Height: 5\' 7"  (170.2 cm) Weight: 147 lb 14.9 oz (67.1 kg) IBW/kg (Calculated) : 61.6 Heparin Dosing Weight: 69kg  Vital Signs: Temp: 98 F (36.7 C) (10/20 0437) Temp Source: Axillary (10/20 0437) BP: 109/60 mmHg (10/20 0437) Pulse Rate: 104 (10/20 0000)  Labs:  Recent Labs  09/01/15 0610  09/02/15 0612 09/02/15 2225 09/03/15 0420  HGB 9.0*  --  7.4*  --  7.8*  HCT 27.6*  --  22.9*  --  25.1*  PLT 196  --  166  --  209  HEPARINUNFRC  --   < > <0.10* 2.01* <0.10*  CREATININE 3.65*  --  3.06*  --  4.50*  < > = values in this interval not displayed.  Estimated Creatinine Clearance: 10.3 mL/min (by C-G formula based on Cr of 4.5).  Assessment: 74 yo female with Afib, Coumadin on hold, for heparin  Goal of Therapy:  Heparin level 0.3-0.7 units/ml Monitor platelets by anticoagulation protocol: Yes   Plan:  Increase Heparin 1300 units/hr Check heparin level in 8 hours.   Geannie RisenGreg Rueben Kassim, PharmD, BCPS  09/03/2015 6:49 AM

## 2015-09-03 NOTE — Progress Notes (Addendum)
Subjective:  No complaints. WBC up but temps coming down. Alert and Ox 3 today  Objective Vital signs in last 24 hours: Filed Vitals:   09/02/15 2015 09/03/15 0000 09/03/15 0437 09/03/15 1100  BP: 132/58  109/60 117/60  Pulse:  104    Temp: 100.5 F (38.1 C)  98 F (36.7 C)   TempSrc: Oral  Axillary   Resp:  27 22   Height:      Weight:   67.1 kg (147 lb 14.9 oz)   SpO2:  97% 99%    Weight change: -2.7 kg (-5 lb 15.2 oz)  Physical Exam: General: thin chronically-ill appearing, more awake, fully oriented Heart: Irreg . irreg no rub  Lungs: CTA  bilat Abdomen: BS pos, soft , nontender and nondistended Extremities: Dialysis Access: LUA avf patent on hd  Neuro: "Lamar', "2016"  Dialysis Orders: TTS NW 4 hrs 65.5kgs 2K/2Ca L AVF 1000u hep hectorol 2 venofor 100 q HD until 10/8 then 50 q week Micera 100 q2 weeks - to start 10/6  Assessment: 1 Fevers / sepsis / ischemic colitis by CT - s/p 7day abx course; became febrile again 102 deg F; zosyn restarted 10/18. Repeat bcx's neg and abd benign on exam.   2 Hypotension - resolved, tapering midodrine 3 Vol exces -resolving, just over dry wt 4 Perm afib on coreg/ coumadin restarted 5 hx CABG 6 Anemia cont darbe, Hb down 7.8, observe, check Fe tibc 7 MBD low P,no binders for now/ cont hect 8 Malnutrition alb 1's 9 Debility - improving some 10 AMS - better, oriented x 3 today  Plan -  HD today UF 2kg. Fe tibc.   Vinson Moselleob Yunus Stoklosa MD [facility] pager [phone]    cell [phone] 09/03/2015, 12:30 PM   Labs: Basic Metabolic Panel:  Recent Labs Lab 08/30/15 0327  09/01/15 0610 09/02/15 0612 09/03/15 0420  NA 132*  < > 128* 134* 133*  K 4.6  < > 3.9 4.0 4.3  CL 96*  < > 93* 96* 99*  CO2 27  < > 25 26 25   GLUCOSE 127*  < > 203* 136* 64*  BUN 22*  < > 17 12 22*  CREATININE 3.86*  < > 3.65* 3.06* 4.50*  CALCIUM 7.8*  < > 7.3* 7.6* 7.8*  PHOS 2.8  --   --  2.2*  --   < > = values in this  interval not displayed. Liver Function Tests:  Recent Labs Lab 08/30/15 0327 09/02/15 0612  ALBUMIN 1.4* 1.5*   No results for input(s): LIPASE, AMYLASE in the last 168 hours. No results for input(s): AMMONIA in the last 168 hours. CBC:  Recent Labs Lab 08/28/15 1413  08/30/15 0327 08/31/15 0615 09/01/15 0610 09/02/15 0612 09/03/15 0420  WBC 22.5*  < > 13.4* 15.4* 15.8* 16.3* 20.5*  NEUTROABS 19.4*  --   --   --   --   --   --   HGB 7.2*  < > 9.2* 9.4* 9.0* 7.4* 7.8*  HCT 23.0*  < > 28.5* 28.7* 27.6* 22.9* 25.1*  MCV 93.1  < > 91.1 90.3 91.7 92.0 92.6  PLT 175  < > 169 210 196 166 209  < > = values in this interval not displayed. Cardiac Enzymes: No results for input(s): CKTOTAL, CKMB, CKMBINDEX, TROPONINI in the last 168 hours. CBG:  Recent Labs Lab 09/03/15 0451 09/03/15 0509 09/03/15 0703 09/03/15 0738 09/03/15 1128  GLUCAP 52* 68 108* 112* 79  Studies/Results: No results found. Medications: . heparin 1,300 Units/hr (09/03/15 1200)   . sodium chloride   Intravenous Once  . carvedilol  10 mg Oral Daily  . darbepoetin (ARANESP) injection - DIALYSIS  150 mcg Intravenous Q Tue-HD  . doxercalciferol  1 mcg Intravenous Q T,Th,Sa-HD  . insulin aspart  0-15 Units Subcutaneous Q4H  . levothyroxine  250 mcg Oral QAC breakfast  . midodrine  10 mg Oral TID WC  . multivitamin  1 tablet Oral QHS  . piperacillin-tazobactam (ZOSYN)  IV  2.25 g Intravenous 3 times per day  . pneumococcal 23 valent vaccine  0.5 mL Intramuscular Tomorrow-1000  . pravastatin  40 mg Oral q1800  . sodium chloride  3 mL Intravenous Q12H  . warfarin  6 mg Oral ONCE-1800  . Warfarin - Pharmacist Dosing Inpatient   Does not apply q1800     '

## 2015-09-03 NOTE — Progress Notes (Signed)
ANTICOAGULATION CONSULT NOTE - Follow-up Consult  Pharmacy Consult for heparin Indication: VTE treatment  No Known Allergies  Patient Measurements: Height: 5\' 7"  (170.2 cm) Weight: 148 lb 13 oz (67.5 kg) IBW/kg (Calculated) : 61.6 Heparin Dosing Weight: 69kg  Vital Signs: Temp: 100.5 F (38.1 C) (10/19 2015) Temp Source: Oral (10/19 2015) BP: 132/58 mmHg (10/19 2015) Pulse Rate: 104 (10/20 0000)  Labs:  Recent Labs  08/31/15 0615 09/01/15 0610 09/01/15 1942 09/02/15 0612 09/02/15 2225  HGB 9.4* 9.0*  --  7.4*  --   HCT 28.7* 27.6*  --  22.9*  --   PLT 210 196  --  166  --   HEPARINUNFRC  --   --  0.27* <0.10* 2.01*  CREATININE 4.77* 3.65*  --  3.06*  --     Estimated Creatinine Clearance: 15.2 mL/min (by C-G formula based on Cr of 3.06).  Assessment: 74 YOF on warfarin PTA for history of AFib. CHad2-Vasc2~4 [8%/yr risk] .She presented with embolic events to cecum and colon and was deemed not to be a surgical candidate.  Warfarin was restarted, then held d/t possible need for surgery. GI okayed restarting anticoagulation as fecal occult blood test was negative. Heparin level undetectable 10/19 a.m. on 1000 units/hr.  Pt with very limited access so they were holding heparin for 20 minutes and then drawing level from same line. Hgb down to 7.4, plt down to 166. No overt bleeding per RN. FOBT remains negative.  Now lab is drawing heparin level. Heparin is infusing in the R posterior wrist site and heparin level drawn from the R hand site so will be contaminated with heparin. Heparin level >2 but not accurate. 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  Goal of Therapy:  Heparin level 0.3-0.7 units/ml Monitor platelets by anticoagulation protocol: Yes   Plan:  -Continue heparin at 1200 units/hr -RN to hold heparin in a.m. for 15-20 minutes prior to level being drawn -May have to consider getting order for footstick if holding heparin before drawing level  is too cumbersome or is missed -Follow very closely for s/s bleeding   Christoper Fabianaron Megan Vasquez, PharmD, BCPS Clinical pharmacist, pager [phone] 09/03/2015 1:20 AM

## 2015-09-03 NOTE — Progress Notes (Addendum)
Came to follow up x 2 but in CT and now in HD. Note work up for fever ongoing now. I did not speak with family today. Goals at this point clear but we will restart conversation with new information or change in status. CMRN says potential for placement in LTAC. Please call [phone] for acute palliative needs.  No Charge  Yong ChannelAlicia Monte Bronder, NP Palliative Medicine Team Pager # [phone] (M-F 8a-5p) Team Phone # [phone] (Nights/Weekends)

## 2015-09-03 NOTE — Progress Notes (Signed)
ANTICOAGULATION CONSULT NOTE - Initial Consult  Pharmacy Consult:  Coumadin Indication: atrial fibrillation  No Known Allergies  Patient Measurements: Height: 5\' 7"  (170.2 cm) Weight: 147 lb 14.9 oz (67.1 kg) IBW/kg (Calculated) : 61.6  Vital Signs: Temp: 98 F (36.7 C) (10/20 0437) Temp Source: Axillary (10/20 0437) BP: 109/60 mmHg (10/20 0437) Pulse Rate: 104 (10/20 0000)  Labs:  Recent Labs  09/01/15 0610  09/02/15 0612 09/02/15 2225 09/03/15 0420  HGB 9.0*  --  7.4*  --  7.8*  HCT 27.6*  --  22.9*  --  25.1*  PLT 196  --  166  --  209  HEPARINUNFRC  --   < > <0.10* 2.01* <0.10*  CREATININE 3.65*  --  3.06*  --  4.50*  < > = values in this interval not displayed.  Estimated Creatinine Clearance: 10.3 mL/min (by C-G formula based on Cr of 4.5).   Medical History: Past Medical History  Diagnosis Date  . Diabetes mellitus without complication (HCC)   . Hypertension   . Hyperlipidemia   . Atrial fibrillation (HCC)   . ESRD (end stage renal disease) (HCC)     HD since 06/2000  . CAD (coronary artery disease)   . 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 History of nuclear stress test 10/2012    lexiscan; normal stress test; post-stress EF72%  . Peripheral arterial disease (HCC)   . Osteoporosis   . Mitral regurgitation     severe by 2-D echocardiogram performed 10/22/14      Assessment: Megan Vasquez with history of Afib on Coumadin PTA.  Med was switched to heparin infusion due to AMS and concern with swallowing.  Now to transition back to Coumadin.  Last INR was sub-therapeutic at 1.91 on 08/27/15.   Goal of Therapy:  INR 2-3    Plan:  - Coumadin 6mg  PO today - Daily PT / INR - Continue heparin gtt at 1300 units/hr - F/U HL at 1600 - Watch CBGs    Elie Gragert D. Laney Potashang, PharmD, BCPS Pager:  [phone] 09/03/2015, 11:03 AM

## 2015-09-03 NOTE — Progress Notes (Signed)
PROGRESS NOTE  Megan Vasquez WGN:[id number] DOB: Jan 13, 1941 DOA: 08/27/2015 PCP: [name], MD  HPI: 74 yo female patient chronic kidney disease on dialysis on Tuesday Thursday Saturday, chronic atrial fibrillation on warfarin and rate control, chronic kidney disease with anemia, mild baseline chronic thrombocytopenia (platelets are typically around 120,000), diabetes on insulin, hypertension, recent diagnosis of severe mitral regurgitation asymptomatic December 2015 per echocardiogram, hypothyroidism, dyslipidemia and known CAD. Patient was brought to the hospital today by her grandson because of altered mentation abrupt onset.  10/4 CT scan on admission with concerns for pneumatosis along the cecum and ascending colon at the right lower quadrant, of uncertain significance, with mild associated wall thickening and soft tissue inflammation, ?early necrotizing enterocolitis 10/8 patient progressively worsening with increased leukocytosis, hypotension and decreasing mentation. Transferred to ICU for CRRT 10/13 improving, transferred to [facility] 10/19 fever curve worsening, restarted antibiotics   Subjective / 24 H Interval events - has no complaints for me this morning - denies abdominal pain, denies nausea/vomiting - afebrile overnight   Assessment/Plan: Principal Problem:   Atrial fibrillation with RVR (HCC) Active Problems:   Long term current use of anticoagulant therapy   Diabetes mellitus, insulin dependent (IDDM), controlled (HCC)   HTN (hypertension)   CKD (chronic kidney disease) stage V requiring chronic dialysis (HCC)   HLD (hyperlipidemia)   Coronary artery disease   Severe mitral regurgitation   RLQ abdominal pain   Thrombocytopenia (HCC)   Anemia, chronic renal failure   Hypothyroidism   Metabolic encephalopathy   Sepsis (HCC)   Fever, unspecified   Leukocytosis   PAD (peripheral artery disease) (HCC)   Right foot pain   Acute respiratory failure with  hypoxia (HCC)   Hemodialysis catheter dysfunction (HCC)   Pneumatosis coli   Hypotension   Ischemia, bowel (HCC)   Palliative care encounter   Pressure ulcer   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 Michaell Cowing note and on my evaluation today - it appears to be an ongoing issue, with high fever 10/19, repeat CT scan ordered and pending - with ongoing diarrhea, C diff negative. Imodium prn today  Acute blood loss anemia vs anemia of acute illness - possibly due to #1, however FOBT negative, possibly in the setting of febrile illness - transfused 2U pRBC 10/6 and 10/14, 2U FFP - Hb stable, no evidence of bleeding, start Coumadin for A fib today   Fever - ?due to #1 vs other source, she has no respiratory complaints - C diff negative - given high fever 102.8 and early septic appearance have started Zosyn on 10/19, plan for 7 days - CT scan pending  ESRD  - nephrology following - Continue Midrodrine 10 tid for BP support  Afib with RVR - CHad2-Vasc2~4 [8%/yr risk]  - continue Coreg - cardiology saw patient and s/o 10/11 - continue heparin, transition to Coumadin today  DM type 2 - change to Q4 hr at request of daughter who wishes [place] to check the cbg q4 d - stable CBGs - discontinue Lantus as persistently hypoglycemic this morning, poor po intake  Hypothyroidism  - Levothyroxine 250 mcg  Mod-severe MV regurgitation / PAH - medical management-not candidate operative  HLD - cont prvachol 40 daily  Metabolic encephalopathy - mental status close to baseline today    Diet: Diet full liquid Room service appropriate?: Yes; Fluid consistency:: Thin Fluids: none DVT Prophylaxis: heparin  Code Status: Partial Code Family Communication:  d/w daughter bedside  Disposition Plan: remain inpatient  Barriers to discharge: fever,  anemia  Consultants:  Cardiology  Nephrology  General surgery  Palliative   Procedures:  None    Antibiotics 1. Vanc + Zosyn 10/4-->10/11 2. Zosyn 10/19 >>   Studies  No results found.  Objective  Filed Vitals:   09/02/15 2000 09/02/15 2015 09/03/15 0000 09/03/15 0437  BP:  132/58  109/60  Pulse:   104   Temp:  100.5 F (38.1 C)  98 F (36.7 C)  TempSrc:  Oral  Axillary  Resp: 30  27 22   Height:      Weight:    67.1 kg (147 lb 14.9 oz)  SpO2:   97% 99%    Intake/Output Summary (Last 24 hours) at 09/03/15 1029 Last data filed at 09/03/15 0800  Gross per 24 hour  Intake 1068.8 ml  Output      0 ml  Net 1068.8 ml   Filed Weights   09/01/15 1233 09/02/15 0426 09/03/15 0437  Weight: 66.2 kg (145 lb 15.1 oz) 67.5 kg (148 lb 13 oz) 67.1 kg (147 lb 14.9 oz)   Exam:  GENERAL: NAD  HEENT: head NCAT, no scleral icterus.   NECK: Supple. No LAD  LUNGS: Clear to auscultation. No wheezing or crackles  HEART: Regular rate and rhythm without murmur. Decreased pulse on right LE, no JVD, no peripheral edema  ABDOMEN: Soft, non-distended, non-tender. Positive bowel sounds.  EXTREMITIES: Without any cyanosis or clubbing. Good muscle tone  NEUROLOGIC: non focal   Data Reviewed: Basic Metabolic Panel:  Recent Labs Lab 08/30/15 0327 08/31/15 0615 09/01/15 0610 09/02/15 0612 09/03/15 0420  NA 132* 128* 128* 134* 133*  K 4.6 4.6 3.9 4.0 4.3  CL 96* 95* 93* 96* 99*  CO2 27 24 25 26 25   GLUCOSE 127* 120* 203* 136* 64*  BUN 22* 29* 17 12 22*  CREATININE 3.86* 4.77* 3.65* 3.06* 4.50*  CALCIUM 7.8* 7.4* 7.3* 7.6* 7.8*  PHOS 2.8  --   --  2.2*  --    Liver Function Tests:  Recent Labs Lab 08/30/15 0327 09/02/15 0612  ALBUMIN 1.4* 1.5*   CBC:  Recent Labs Lab 08/28/15 1413  08/30/15 0327 08/31/15 0615 09/01/15 0610 09/02/15 0612 09/03/15 0420  WBC 22.5*  < > 13.4* 15.4* 15.8* 16.3* 20.5*  NEUTROABS 19.4*  --   --   --   --   --   --   HGB  7.2*  < > 9.2* 9.4* 9.0* 7.4* 7.8*  HCT 23.0*  < > 28.5* 28.7* 27.6* 22.9* 25.1*  MCV 93.1  < > 91.1 90.3 91.7 92.0 92.6  PLT 175  < > 169 210 196 166 209  < > = values in this interval not displayed. BNP (last 3 results)  Recent Labs  07-23-2015 1120  BNP 1556.0*   CBG:  Recent Labs Lab 09/03/15 0427 09/03/15 0451 09/03/15 0509 09/03/15 0703 09/03/15 0738  GLUCAP 57* 52* 68 108* 112*    Recent Results (from the past 240 hour(s))  C difficile quick scan w PCR reflex     Status: None   Collection Time: 09/02/15  7:54 AM  Result Value Ref Range Status   C Diff antigen NEGATIVE NEGATIVE Final   C Diff toxin NEGATIVE NEGATIVE Final   C Diff interpretation Negative for toxigenic C. difficile  Final     Scheduled Meds: . sodium chloride   Intravenous Once  . carvedilol  10 mg  Oral Daily  . darbepoetin (ARANESP) injection - DIALYSIS  150 mcg Intravenous Q Tue-HD  . doxercalciferol  1 mcg Intravenous Q T,Th,Sa-HD  . insulin aspart  0-15 Units Subcutaneous Q4H  . levothyroxine  250 mcg Oral QAC breakfast  . midodrine  10 mg Oral TID WC  . multivitamin  1 tablet Oral QHS  . piperacillin-tazobactam (ZOSYN)  IV  2.25 g Intravenous 3 times per day  . pneumococcal 23 valent vaccine  0.5 mL Intramuscular Tomorrow-1000  . pravastatin  40 mg Oral q1800  . sodium chloride  3 mL Intravenous Q12H   Continuous Infusions: . heparin 1,300 Units/hr (09/03/15 0800)   Pamella Pert, MD [facility] Pager [phone]. If 7 PM - 7 AM, please contact night-coverage at [email or web address], password [facility] 09/03/2015, 10:29 AM  LOS: 16 days

## 2015-09-03 NOTE — Progress Notes (Addendum)
ANTICOAGULATION CONSULT NOTE   Pharmacy Consult for Heparin to Lovenox Indication: Atrial fibrillation  No Known Allergies  Patient Measurements: Height: 5\' 7"  (170.2 cm) Weight: 156 lb 4.9 oz (70.9 kg) IBW/kg (Calculated) : 61.6 Heparin Dosing Weight: 69kg  Vital Signs: Temp: 98.9 F (37.2 C) (10/20 1520) Temp Source: Oral (10/20 1520) BP: 111/65 mmHg (10/20 1533) Pulse Rate: 112 (10/20 1533)  Labs:  Recent Labs  09/01/15 0610  09/02/15 0612 09/02/15 2225 09/03/15 0420 09/03/15 1455  HGB 9.0*  --  7.4*  --  7.8*  --   HCT 27.6*  --  22.9*  --  25.1*  --   PLT 196  --  166  --  209  --   HEPARINUNFRC  --   < > <0.10* 2.01* <0.10* 1.06*  CREATININE 3.65*  --  3.06*  --  4.50*  --   < > = values in this interval not displayed.  Estimated Creatinine Clearance: 10.3 mL/min (by C-G formula based on Cr of 4.5).  Assessment: 74 yo female with Afib, Coumadin restarted today HL now 1.06 / Heparin levels have been very unreliable  Talked with Dr. Elvera LennoxGherghe about switching to Lovenox at a reduced dose for ESRD - agreeable, bleed risk minimal.  Goal of Therapy:  Ant-Xa level of 0.6 to 1 unit / ml Monitor platelets by anticoagulation protocol: Yes   Plan:  DC heparin at 1900 pm Will begin Lovenox at reduced dose of 50 mg sq Q 24 hours (0.7 mg / kg sq Q 24 hours) starting at 8 pm tonight Coumadin resumed today Will check anti-Xa level if needed  Thank you Okey RegalLisa Sonoma Firkus, PharmD [phone]  09/03/2015 4:07 PM

## 2015-09-03 NOTE — Progress Notes (Signed)
Hypoglycemic Event  CBG: 52  Treatment: 15 GM carbohydrate snack  Symptoms: None  Follow-up CBG: Time:0510 CBG Result:68  Possible Reasons for Event: Inadequate meal intake  Comments/MD notified:    Alonza BogusDuvall, Maripaz Mullan Gray

## 2015-09-03 NOTE — Progress Notes (Signed)
Hypoglycemic Event  CBG: 57  Treatment: 15 GM carbohydrate snack  Symptoms: None  Follow-up CBG: Time: 0452 CBG Result: 52  Possible Reasons for Event: Inadequate meal intake  Comments/MD notified: Lantus QHS.  Pt with poor po intake.    [name], Jahzaria Vary Gray

## 2015-09-04 LAB — CBC
HEMATOCRIT: 25.8 % — AB (ref 36.0–46.0)
HEMOGLOBIN: 8 g/dL — AB (ref 12.0–15.0)
MCH: 28.5 pg (ref 26.0–34.0)
MCHC: 31 g/dL (ref 30.0–36.0)
MCV: 91.8 fL (ref 78.0–100.0)
Platelets: 204 10*3/uL (ref 150–400)
RBC: 2.81 MIL/uL — AB (ref 3.87–5.11)
RDW: 16.3 % — ABNORMAL HIGH (ref 11.5–15.5)
WBC: 15.5 10*3/uL — ABNORMAL HIGH (ref 4.0–10.5)

## 2015-09-04 LAB — BASIC METABOLIC PANEL
ANION GAP: 11 (ref 5–15)
BUN: 11 mg/dL (ref 6–20)
CALCIUM: 7.7 mg/dL — AB (ref 8.9–10.3)
CO2: 28 mmol/L (ref 22–32)
Chloride: 96 mmol/L — ABNORMAL LOW (ref 101–111)
Creatinine, Ser: 3.03 mg/dL — ABNORMAL HIGH (ref 0.44–1.00)
GFR, EST AFRICAN AMERICAN: 16 mL/min — AB (ref >60)
GFR, EST NON AFRICAN AMERICAN: 14 mL/min — AB (ref >60)
GLUCOSE: 127 mg/dL — AB (ref 65–99)
POTASSIUM: 4.2 mmol/L (ref 3.5–5.1)
Sodium: 135 mmol/L (ref 135–145)

## 2015-09-04 LAB — PROTIME-INR
INR: 1.67 — AB (ref 0.00–1.49)
PROTHROMBIN TIME: 19.7 s — AB (ref 11.6–15.2)

## 2015-09-04 LAB — GLUCOSE, CAPILLARY
GLUCOSE-CAPILLARY: 135 mg/dL — AB (ref 65–99)
GLUCOSE-CAPILLARY: 182 mg/dL — AB (ref 65–99)
Glucose-Capillary: 188 mg/dL — ABNORMAL HIGH (ref 65–99)
Glucose-Capillary: 146 mg/dL — ABNORMAL HIGH (ref 65–99)

## 2015-09-04 LAB — HEPATITIS B SURFACE ANTIGEN: HEP B S AG: NEGATIVE

## 2015-09-04 MED ORDER — INSULIN ASPART 100 UNIT/ML ~~LOC~~ SOLN
0.0000 [IU] | Freq: Three times a day (TID) | SUBCUTANEOUS | Status: DC
  Administered 2015-09-04: 1 [IU] via SUBCUTANEOUS
  Administered 2015-09-04: 2 [IU] via SUBCUTANEOUS
  Administered 2015-09-05: 3 [IU] via SUBCUTANEOUS
  Administered 2015-09-05: 5 [IU] via SUBCUTANEOUS
  Administered 2015-09-05: 2 [IU] via SUBCUTANEOUS
  Administered 2015-09-06: 3 [IU] via SUBCUTANEOUS
  Administered 2015-09-06 (×2): 2 [IU] via SUBCUTANEOUS
  Administered 2015-09-07 (×2): 5 [IU] via SUBCUTANEOUS

## 2015-09-04 NOTE — Progress Notes (Signed)
Pt temp 100.4, PRN tylenol given, will continue to monitor closely.  Raymon MuttonGwen Elica Almas RN

## 2015-09-04 NOTE — Progress Notes (Signed)
[facility] Progress Note  Assessment/Plan: 1.Fevers / sepsis / ischemic colitis by CT - s/p 7day abx course; became febrile again 102 deg F; zosyn restarted 10/18. Repeat bcx's neg and abd benign on exam.  2 Hypotension - resolved, tapering midodrine 3 Vol exces -resolving, just over dry wt. Last HD 10/20 Net UF 700. BP stable. For HD tomorrow.  4 Perm afib on coreg/ coumadin restarted managed per pharmacy 5 hx CABG  6 Anemia cont darbe, Hb down 8.0 today. Follow CBC, observe, check Fe tibc  7 MBD low P,no binders for now/ cont hect  8 Malnutrition alb 1's  9 Debility - improving some. Possible DC to LTAC/palliative care consulted. 10 AMS - better, oriented x 3 today   Subjective: "I feel all right today". Eating lunch, daughter at bedside. No issues reported by family  Objective Filed Vitals:   09/03/15 1900 09/03/15 1920 09/03/15 2106 09/04/15 0551  BP: 128/74 125/77 136/69 110/65  Pulse: 144 141 111 119  Temp:   100.7 F (38.2 C) 100.2 F (37.9 C)  TempSrc:   Oral Oral  Resp: Height:      Weight:      SpO2:   98% 98%   Physical Exam General: Elderly frail appearing female in NAD Heart: S1, S2, irregular No M/R Lungs: Bilateral breath decreased in bases. Without WOB Abdomen: Abdomen soft, nondistended Extremities: No LE edema. Heel protectors in place. Dialysis Access: L AVF + Bruit  Dialysis Orders: TTS NW 4 hrs 65.5kgs 2K/2Ca L AVF 1000u hep hectorol 2 venofor 100 q HD until 10/8 then 50 q week Micera 100 q2 weeks - to start 10/6  Additional Objective Labs: Basic Metabolic Panel:  Recent Labs Lab 08/30/15 0327  09/02/15 0612 09/03/15 0420 09/04/15 0430  NA 132*  < > 134* 133* 135  K 4.6  < > 4.0 4.3 4.2  CL 96*  < > 96* 99* 96*  CO2 27  < > GLUCOSE 127*  < > 136* 64* 127*  BUN 22*  < > 12 22* 11  CREATININE 3.86*  < > 3.06* 4.50* 3.03*  CALCIUM 7.8*  < > 7.6* 7.8* 7.7*  PHOS 2.8  --  2.2*  --   --    < > = values in this interval not displayed. Liver Function Tests:  Recent Labs Lab 08/30/15 0327 09/02/15 0612  ALBUMIN 1.4* 1.5*   No results for input(s): LIPASE, AMYLASE in the last 168 hours. CBC:  Recent Labs Lab 08/28/15 1413  08/31/15 0615 09/01/15 0610 09/02/15 0612 09/03/15 0420 09/04/15 0430  WBC 22.5*  < > 15.4* 15.8* 16.3* 20.5* 15.5*  NEUTROABS 19.4*  --   --   --   --   --   --   HGB 7.2*  < > 9.4* 9.0* 7.4* 7.8* 8.0*  HCT 23.0*  < > 28.7* 27.6* 22.9* 25.1* 25.8*  MCV 93.1  < > 90.3 91.7 92.0 92.6 91.8  PLT 175  < > 210 196 166 209 204  < > = values in this interval not displayed. Blood Culture    Component Value Date/Time   SDES BLOOD HAND RIGHT 08/21/2015 0800   SPECREQUEST BOTTLES DRAWN AEROBIC ONLY 10CC 08/21/2015 0800   CULT NO GROWTH 5 DAYS 08/21/2015 0800   REPTSTATUS 08/26/2015 FINAL 08/21/2015 0800    Cardiac Enzymes: No results for input(s): CKTOTAL, CKMB, CKMBINDEX, TROPONINI in the last 168 hours. CBG:  Recent  Labs Lab 09/03/15 1128 09/03/15 2048 09/03/15 2351 09/04/15 0730 09/04/15 1141  GLUCAP 79 78 130* 135* 188*   Iron Studies:  Recent Labs  09/03/15 1455  IRON 21*  TIBC 130*   @lablastinr3 @ Studies/Results: Ct Abdomen Pelvis Wo Contrast  09/03/2015  CLINICAL DATA:  Followup abnormal ascending colon. EXAM: CT ABDOMEN AND PELVIS WITHOUT CONTRAST TECHNIQUE: Multidetector CT imaging of the abdomen and pelvis was performed following the standard protocol without IV contrast. COMPARISON:  CT scans 10/05 and 08/24/2015 FINDINGS: Lower chest: There are persistent small bilateral pleural effusions, right greater than left with overlying atelectasis. The heart is enlarged but stable. No pericardial effusion. Dense 3 vessel coronary artery calcifications are noted along with dense aortic calcifications. Hepatobiliary: No focal hepatic lesions. Stable prominent intrahepatic vasculature with scattered calcifications. The gallbladder is  slightly distended with some mild surrounding inflammatory change. No common bile duct dilatation. Pancreas: No mass, inflammation or duct dilatation. Moderate atrophy. Spleen: No mass.  Normal size. Adrenals/Urinary Tract: The adrenal glands are normal and stable. Both kidneys are small and demonstrate renal artery calcifications. No worrisome mass or hydronephrosis. Stomach/Bowel: The stomach, duodenum, small bowel and colon appear stable. No findings for small bowel inflammation or obstruction. The terminal ileum is normal. Persistent abnormal appearance of the cecum. There is fairly significant cecal wall thickening with some component of pneumatosis. This also involves the ascending colon. It may reflect some type of chronic colitis. Ischemic colitis is unlikely given the little change since 08/19/2015. Vascular/Lymphatic: Advanced atherosclerotic calcifications involving the aorta and branch vessels, most notably the renal arteries. No obvious mesenteric or retroperitoneal mass or adenopathy. There is diffuse mesenteric edema and a small amount of abdominal/ pelvic fluid. Anasarca is also noted. Other: Stable enlarged fibroid uterus with calcified fibroids. The bladder is moderately distended. No pelvic mass or adenopathy. No inguinal mass or adenopathy. Musculoskeletal: No significant bony findings. Stable remote L2 fracture and advanced degenerative changes involving the spine IMPRESSION: 1. Persistent small bilateral pleural effusions, right greater than left with overlying atelectasis. 2. Cardiac enlargement and dense 3 vessel coronary artery calcifications. 3. Persistent diffuse wall thickening involving the cecum and ascending colon with patchy pneumatosis coli. This could be chronic colitis. Ischemia is unlikely given the time course of stability. No perforation or abscess. No obstruction. 4. Enlarged fibroid uterus. 5. Mild distention of the bladder. 6. Mesenteric edema, small amount of abdominal and  free pelvic fluid and diffuse body wall edema. Electronically Signed   By: Rudie MeyerP.  Gallerani M.D.   On: 09/03/2015 14:13   Medications:   . sodium chloride   Intravenous Once  . carvedilol  10 mg Oral Daily  . darbepoetin (ARANESP) injection - DIALYSIS  150 mcg Intravenous Q Tue-HD  . doxercalciferol  1 mcg Intravenous Q T,Th,Sa-HD  . enoxaparin (LOVENOX) injection  50 mg Subcutaneous Q24H  . insulin aspart  0-9 Units Subcutaneous TID WC  . levothyroxine  250 mcg Oral QAC breakfast  . midodrine  5 mg Oral TID WC  . multivitamin  1 tablet Oral QHS  . piperacillin-tazobactam (ZOSYN)  IV  2.25 g Intravenous 3 times per day  . pneumococcal 23 valent vaccine  0.5 mL Intramuscular Tomorrow-1000  . pravastatin  40 mg Oral q1800  . sodium chloride  3 mL Intravenous Q12H  . warfarin  6 mg Oral ONCE-1800  . Warfarin - Pharmacist Dosing Inpatient   Does not apply q1800    Rita H. Brown NP-C 09/04/2015, 12:16 PM  [facility] [phone]  Pt seen, examined and agree w A/P as above.  Vinson Moselle MD [employer] pager [phone]    cell [phone] 09/04/2015, 1:00 PM

## 2015-09-04 NOTE — Clinical Social Work Note (Signed)
CSW continues to follow for DC needs. Patient is still unable to sit for outpatient HD which would need to be achieved for SNF placement. CSW appreciates RNCM's efforts for [facility], unclear if this will be an option at this time. CSW will leave report for weekend CSW.   Megan Vasquez Christell Steinmiller MSW, PalominasLCSW, [place], [phone]

## 2015-09-04 NOTE — Progress Notes (Signed)
PROGRESS NOTE  Megan Vasquez ZOX:[id number] DOB: Sep 21, 1941 DOA: 08/15/2015 PCP: [name], MD  HPI: 74 yo female patient chronic kidney disease on dialysis on Tuesday Thursday Saturday, chronic atrial fibrillation on warfarin and rate control, chronic kidney disease with anemia, mild baseline chronic thrombocytopenia (platelets are typically around 120,000), diabetes on insulin, hypertension, recent diagnosis of severe mitral regurgitation asymptomatic December 2015 per echocardiogram, hypothyroidism, dyslipidemia and known CAD. Patient was brought to the hospital today by her grandson because of altered mentation abrupt onset.  10/4 CT scan on admission with concerns for pneumatosis along the cecum and ascending colon at the right lower quadrant, of uncertain significance, with mild associated wall thickening and soft tissue inflammation, ?early necrotizing enterocolitis 10/8 patient progressively worsening with increased leukocytosis, hypotension and decreasing mentation. Transferred to ICU for CRRT 10/13 improving, transferred to [facility] 10/19 fever curve worsening, restarted antibiotics 10/20 repeat CT scan with chronic colitis without perforation or abscess  Subjective / 24 H Interval events - no complaints this morning, denies abdominal pain, denies shortness of breath, no chest pain, no nausea/vomiting  Assessment/Plan: Principal Problem:   Atrial fibrillation with RVR (HCC) Active Problems:   Long term current use of anticoagulant therapy   Diabetes mellitus, insulin dependent (IDDM), controlled (HCC)   HTN (hypertension)   CKD (chronic kidney disease) stage V requiring chronic dialysis (HCC)   HLD (hyperlipidemia)   Coronary artery disease   Severe mitral regurgitation   RLQ abdominal pain   Thrombocytopenia (HCC)   Anemia, chronic renal failure   Hypothyroidism   Metabolic encephalopathy   Sepsis (HCC)   Fever, unspecified   Leukocytosis   PAD (peripheral artery  disease) (HCC)   Right foot pain   Acute respiratory failure with hypoxia (HCC)   Hemodialysis catheter dysfunction (HCC)   Pneumatosis coli   Hypotension   Ischemia, bowel (HCC)   Palliative care encounter   Pressure ulcer   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 Michaell Cowing note and on my evaluation as well. Patient has been on IV antibiotics 10/4 - 10/11 with improvement. She became again febrile on 10/19 to 102.8 and Zosyn was restarted. Repeat CT scan of the abdomen on 10/20 showed chronic colitis, less likely ischemic given time course of stability, and without perforation or abscess. Will plan for a 7 day total course of repeat Zosyn for chronic colitis with end date on 10/26. She briefly had diarrhea, which is improving, C diff was negative.  Anemia of acute illness - possibly due to #1 in the setting of febrile illness and underlying CKD, FOBT negative. She was transfused 2U pRBC 10/6 and 10/14, 2U FFP, Hb stable, no evidence of bleeding, started Coumadin for A fib today and will need bridging with heparin iv  Fever - ?due to #1 vs other source, she has no respiratory complaints and other than intraabdominal process there are no other obvious sources. Zosyn for 7 days as above.  ESRD - nephrology following, on IHD. Early during admission, due to ongoing hypotension she was transferred temorarily to the ICU for CRRT. This has now improved.  Afib with RVR - CHad2-Vasc2~4 [8%/yr risk], continue Coreg, cardiology saw patient and s/o 10/11, continue heparin, transition to Coumadin DM type 2 - change to Q4 hr at request of daughter who wishes [place] to check the cbg q4 d, discontinued Lantus as persistently hypoglycemic Hypothyroidism - Levothyroxine 250 mcg Mod-severe MV regurgitation / PAH -  medical management-not candidate operative HLD - cont prvachol 40 daily Metabolic encephalopathy - mental status  close to baseline  Diet: Diet full liquid Room service appropriate?: Yes; Fluid consistency:: Thin Fluids: none DVT Prophylaxis: heparin  Code Status: Partial Code Family Communication: d/w daughter bedside  Disposition Plan: remain inpatient  Barriers to discharge: low grade temp, Coumadin bridging,   Consultants:  Cardiology  Nephrology  General surgery  Palliative   Procedures:  None    Antibiotics 1. Vanc + Zosyn 10/4-->10/11 2. Zosyn 10/19 >>   Studies  Ct Abdomen Pelvis Wo Contrast  09/03/2015  CLINICAL DATA:  Followup abnormal ascending colon. EXAM: CT ABDOMEN AND PELVIS WITHOUT CONTRAST TECHNIQUE: Multidetector CT imaging of the abdomen and pelvis was performed following the standard protocol without IV contrast. COMPARISON:  CT scans 10/05 and 08/24/2015 FINDINGS: Lower chest: There are persistent small bilateral pleural effusions, right greater than left with overlying atelectasis. The heart is enlarged but stable. No pericardial effusion. Dense 3 vessel coronary artery calcifications are noted along with dense aortic calcifications. Hepatobiliary: No focal hepatic lesions. Stable prominent intrahepatic vasculature with scattered calcifications. The gallbladder is slightly distended with some mild surrounding inflammatory change. No common bile duct dilatation. Pancreas: No mass, inflammation or duct dilatation. Moderate atrophy. Spleen: No mass.  Normal size. Adrenals/Urinary Tract: The adrenal glands are normal and stable. Both kidneys are small and demonstrate renal artery calcifications. No worrisome mass or hydronephrosis. Stomach/Bowel: The stomach, duodenum, small bowel and colon appear stable. No findings for small bowel inflammation or obstruction. The terminal ileum is normal. Persistent abnormal appearance of the cecum. There is fairly significant cecal wall thickening with some component of pneumatosis. This also involves the ascending colon. It may reflect  some type of chronic colitis. Ischemic colitis is unlikely given the little change since 08/19/2015. Vascular/Lymphatic: Advanced atherosclerotic calcifications involving the aorta and branch vessels, most notably the renal arteries. No obvious mesenteric or retroperitoneal mass or adenopathy. There is diffuse mesenteric edema and a small amount of abdominal/ pelvic fluid. Anasarca is also noted. Other: Stable enlarged fibroid uterus with calcified fibroids. The bladder is moderately distended. No pelvic mass or adenopathy. No inguinal mass or adenopathy. Musculoskeletal: No significant bony findings. Stable remote L2 fracture and advanced degenerative changes involving the spine IMPRESSION: 1. Persistent small bilateral pleural effusions, right greater than left with overlying atelectasis. 2. Cardiac enlargement and dense 3 vessel coronary artery calcifications. 3. Persistent diffuse wall thickening involving the cecum and ascending colon with patchy pneumatosis coli. This could be chronic colitis. Ischemia is unlikely given the time course of stability. No perforation or abscess. No obstruction. 4. Enlarged fibroid uterus. 5. Mild distention of the bladder. 6. Mesenteric edema, small amount of abdominal and free pelvic fluid and diffuse body wall edema. Electronically Signed   By: Rudie MeyerP.  Gallerani M.D.   On: 09/03/2015 14:13    Objective  Filed Vitals:   09/03/15 1900 09/03/15 1920 09/03/15 2106 09/04/15 0551  BP: 128/74 125/77 136/69 110/65  Pulse: 144 141 111 119  Temp:   100.7 F (38.2 C) 100.2 F (37.9 C)  TempSrc:   Oral Oral  Resp: 18 18 20 18   Height:      Weight:      SpO2:   98% 98%    Intake/Output Summary (Last 24 hours) at 09/04/15 1337 Last data filed at 09/03/15 2130  Gross per 24 hour  Intake 357.63 ml  Output    700 ml  Net -342.37 ml  Filed Weights   09/02/15 0426 09/03/15 0437 09/03/15 1520  Weight: 67.5 kg (148 lb 13 oz) 67.1 kg (147 lb 14.9 oz) 70.9 kg (156 lb 4.9 oz)     Exam:  GENERAL: NAD  HEENT: head NCAT, no scleral icterus.   NECK: Supple. No LAD  LUNGS: Clear to auscultation. No wheezing or crackles  HEART: Regular rate and rhythm without murmur. Decreased pulse on right LE, no JVD, no peripheral edema  ABDOMEN: Soft, non-distended, non-tender. Positive bowel sounds.  EXTREMITIES: Without any cyanosis or clubbing. Good muscle tone  NEUROLOGIC: non focal   Data Reviewed: Basic Metabolic Panel:  Recent Labs Lab 08/30/15 0327 08/31/15 0615 09/01/15 0610 09/02/15 0612 09/03/15 0420 09/04/15 0430  NA 132* 128* 128* 134* 133* 135  K 4.6 4.6 3.9 4.0 4.3 4.2  CL 96* 95* 93* 96* 99* 96*  CO2 27 24 25 26 25 28   GLUCOSE 127* 120* 203* 136* 64* 127*  BUN 22* 29* 17 12 22* 11  CREATININE 3.86* 4.77* 3.65* 3.06* 4.50* 3.03*  CALCIUM 7.8* 7.4* 7.3* 7.6* 7.8* 7.7*  PHOS 2.8  --   --  2.2*  --   --    Liver Function Tests:  Recent Labs Lab 08/30/15 0327 09/02/15 0612  ALBUMIN 1.4* 1.5*   CBC:  Recent Labs Lab 08/28/15 1413  08/31/15 0615 09/01/15 0610 09/02/15 0612 09/03/15 0420 09/04/15 0430  WBC 22.5*  < > 15.4* 15.8* 16.3* 20.5* 15.5*  NEUTROABS 19.4*  --   --   --   --   --   --   HGB 7.2*  < > 9.4* 9.0* 7.4* 7.8* 8.0*  HCT 23.0*  < > 28.7* 27.6* 22.9* 25.1* 25.8*  MCV 93.1  < > 90.3 91.7 92.0 92.6 91.8  PLT 175  < > 210 196 166 209 204  < > = values in this interval not displayed. BNP (last 3 results)  Recent Labs  13-Sep-2015 1120  BNP 1556.0*   CBG:  Recent Labs Lab 09/03/15 1128 09/03/15 2048 09/03/15 2351 09/04/15 0730 09/04/15 1141  GLUCAP 79 78 130* 135* 188*    Recent Results (from the past 240 hour(s))  C difficile quick scan w PCR reflex     Status: None   Collection Time: 09/02/15  7:54 AM  Result Value Ref Range Status   C Diff antigen NEGATIVE NEGATIVE Final   C Diff toxin NEGATIVE NEGATIVE Final   C Diff interpretation Negative for toxigenic C. difficile  Final     Scheduled Meds: .  sodium chloride   Intravenous Once  . carvedilol  10 mg Oral Daily  . darbepoetin (ARANESP) injection - DIALYSIS  150 mcg Intravenous Q Tue-HD  . doxercalciferol  1 mcg Intravenous Q T,Th,Sa-HD  . enoxaparin (LOVENOX) injection  50 mg Subcutaneous Q24H  . insulin aspart  0-9 Units Subcutaneous TID WC  . levothyroxine  250 mcg Oral QAC breakfast  . midodrine  5 mg Oral TID WC  . multivitamin  1 tablet Oral QHS  . piperacillin-tazobactam (ZOSYN)  IV  2.25 g Intravenous 3 times per day  . pneumococcal 23 valent vaccine  0.5 mL Intramuscular Tomorrow-1000  . pravastatin  40 mg Oral q1800  . sodium chloride  3 mL Intravenous Q12H  . warfarin  6 mg Oral ONCE-1800  . Warfarin - Pharmacist Dosing Inpatient   Does not apply q1800   Continuous Infusions:   Pamella Pert, MD [facility] Pager [phone]. If 7 PM - 7  AM, please contact night-coverage at [email or web address], password [facility] 09/04/2015, 1:37 PM  LOS: 17 days

## 2015-09-04 NOTE — Progress Notes (Signed)
Patient's CBG's have been running in the 70's and she has poor appetite. CBG after drinking cranberry juice is 130. Paged Craige CottaKirby NP regarding if patient should get SSC for the 130 with her CBG's running so low today. [facility] Vivian Neuwirth [employer]

## 2015-09-04 NOTE — Progress Notes (Signed)
ANTICOAGULATION / ANTIBIOTIC CONSULT NOTE - Follow Up Consult  Pharmacy Consult:  Lovenox / Coumadin + Zosyn Indication: atrial fibrillation + Intra-abdominal infection/cellulitis  No Known Allergies  Patient Measurements: Height: 5\' 7"  (170.2 cm) Weight: 156 lb 4.9 oz (70.9 kg) IBW/kg (Calculated) : 61.6  Vital Signs: Temp: 100.2 F (37.9 C) (10/21 0551) Temp Source: Oral (10/21 0551) BP: 110/65 mmHg (10/21 0551) Pulse Rate: 119 (10/21 0551)  Labs:  Recent Labs  09/02/15 0612 09/02/15 2225 09/03/15 0420 09/03/15 1455 09/04/15 0430  HGB 7.4*  --  7.8*  --  8.0*  HCT 22.9*  --  25.1*  --  25.8*  PLT 166  --  209  --  204  LABPROT  --   --   --   --  19.7*  INR  --   --   --   --  1.67*  HEPARINUNFRC <0.10* 2.01* <0.10* 1.06*  --   CREATININE 3.06*  --  4.50*  --  3.03*    Estimated Creatinine Clearance: 15.4 mL/min (by C-G formula based on Cr of 3.03).      Assessment: 74 YOF on Coumadin PTA for history of Afib.  Patient was transitioned to IV heparin upon admit due to AMS, and was switched to Lovenox on 09/03/15 due to hard stick and inaccurate heparin levels.  Coumadin was also resumed on 09/03/15 but her dose was not administered.  Patient's INR is sub-therapeutic as expected.  No bleeding reported.   Pharmacy also managing Zosyn for intra-abdominal infection.  She continues on dialysis and her last session was yesterday 09/03/15.  Zosyn 10/5 >> 10/10, restart 10/19 >> Vanc 10/7 >> 10/10 Eraxis 10/8 >> 10/10  10/4 BCX x2: Neg 10/4 Urine Cx: mult. species, suggest recollection  10/4 MRSA PCR: neg 10/7 BCx x2: Neg 10/19 Cdiff neg   Goal of Therapy:  Anti-Xa level 0.6-1 units/ml 4hrs after LMWH dose given  INR  2 - 3 Monitor platelets by anticoagulation protocol: Yes  Resolution of infection    Plan:  - Continue Lovenox 50mg  SQ Q24H (0.7 mg/kg/d) - Anti-Xa level prior to 4th dose to ensure adequate/safe dosing - Coumadin 6mg  PO today - Daily PT /  INR - Continue Zosyn 2.25gm IV Q8H.  Pharmacy will sign off antibiotic dosing as adjustment is unnecessary.  Thank you for the consult!    Ananth Fiallos D. Laney Potashang, PharmD, BCPS Pager:  [phone] 09/04/2015, 7:39 AM

## 2015-09-05 ENCOUNTER — Inpatient Hospital Stay (HOSPITAL_COMMUNITY): Payer: Medicare PPO

## 2015-09-05 DIAGNOSIS — G9340 Encephalopathy, unspecified: Secondary | ICD-10-CM | POA: Insufficient documentation

## 2015-09-05 DIAGNOSIS — R579 Shock, unspecified: Secondary | ICD-10-CM

## 2015-09-05 LAB — BASIC METABOLIC PANEL
Anion gap: 13 (ref 5–15)
BUN: 21 mg/dL — AB (ref 6–20)
CALCIUM: 8.1 mg/dL — AB (ref 8.9–10.3)
CO2: 24 mmol/L (ref 22–32)
CREATININE: 4.41 mg/dL — AB (ref 0.44–1.00)
Chloride: 96 mmol/L — ABNORMAL LOW (ref 101–111)
GFR calc Af Amer: 10 mL/min — ABNORMAL LOW (ref >60)
GFR, EST NON AFRICAN AMERICAN: 9 mL/min — AB (ref >60)
GLUCOSE: 209 mg/dL — AB (ref 65–99)
POTASSIUM: 4.7 mmol/L (ref 3.5–5.1)
SODIUM: 133 mmol/L — AB (ref 135–145)

## 2015-09-05 LAB — GLUCOSE, CAPILLARY
GLUCOSE-CAPILLARY: 210 mg/dL — AB (ref 65–99)
GLUCOSE-CAPILLARY: 262 mg/dL — AB (ref 65–99)
Glucose-Capillary: 197 mg/dL — ABNORMAL HIGH (ref 65–99)
Glucose-Capillary: 121 mg/dL — ABNORMAL HIGH (ref 65–99)

## 2015-09-05 LAB — CBC
HEMATOCRIT: 24.3 % — AB (ref 36.0–46.0)
Hemoglobin: 7.7 g/dL — ABNORMAL LOW (ref 12.0–15.0)
MCH: 28.9 pg (ref 26.0–34.0)
MCHC: 31.7 g/dL (ref 30.0–36.0)
MCV: 91.4 fL (ref 78.0–100.0)
PLATELETS: 200 10*3/uL (ref 150–400)
RBC: 2.66 MIL/uL — ABNORMAL LOW (ref 3.87–5.11)
RDW: 16.4 % — AB (ref 11.5–15.5)
WBC: 16.8 10*3/uL — AB (ref 4.0–10.5)

## 2015-09-05 LAB — OCCULT BLOOD X 1 CARD TO LAB, STOOL: FECAL OCCULT BLD: POSITIVE — AB

## 2015-09-05 LAB — PROTIME-INR
INR: 1.52 — AB (ref 0.00–1.49)
PROTHROMBIN TIME: 18.4 s — AB (ref 11.6–15.2)

## 2015-09-05 LAB — TROPONIN I: TROPONIN I: 0.09 ng/mL — AB (ref <0.031)

## 2015-09-05 LAB — AMMONIA: AMMONIA: 21 umol/L (ref 9–35)

## 2015-09-05 MED ORDER — AMIODARONE LOAD VIA INFUSION
150.0000 mg | Freq: Once | INTRAVENOUS | Status: AC
  Administered 2015-09-05: 150 mg via INTRAVENOUS
  Filled 2015-09-05: qty 83.34

## 2015-09-05 MED ORDER — ACETAMINOPHEN 650 MG RE SUPP
650.0000 mg | Freq: Four times a day (QID) | RECTAL | Status: DC | PRN
  Administered 2015-09-05: 650 mg via RECTAL

## 2015-09-05 MED ORDER — SODIUM CHLORIDE 0.9 % IV BOLUS (SEPSIS)
1000.0000 mL | Freq: Once | INTRAVENOUS | Status: AC
  Administered 2015-09-06: 1000 mL via INTRAVENOUS

## 2015-09-05 MED ORDER — AMIODARONE HCL IN DEXTROSE 360-4.14 MG/200ML-% IV SOLN
30.0000 mg/h | INTRAVENOUS | Status: DC
Start: 2015-09-05 — End: 2015-09-07
  Administered 2015-09-05 – 2015-09-07 (×4): 30 mg/h via INTRAVENOUS
  Filled 2015-09-05 (×6): qty 200

## 2015-09-05 MED ORDER — ACETAMINOPHEN 650 MG RE SUPP: 650.0000 mg | RECTAL | Status: DC | PRN

## 2015-09-05 MED ORDER — AMIODARONE HCL IN DEXTROSE 360-4.14 MG/200ML-% IV SOLN
60.0000 mg/h | INTRAVENOUS | Status: AC
  Administered 2015-09-05: 60 mg/h via INTRAVENOUS

## 2015-09-05 MED ORDER — METOPROLOL TARTRATE 1 MG/ML IV SOLN
2.5000 mg | INTRAVENOUS | Status: AC | PRN
  Administered 2015-09-05 (×2): 2.5 mg via INTRAVENOUS

## 2015-09-05 NOTE — Progress Notes (Signed)
eLink Physician-Brief Progress Note Patient Name: Megan MaudlinMarjorie Vasquez DOB: 10/25/1941 MRN: [id number]   Date of Service  09/05/2015  HPI/Events of Note  Hypotension - BP = 66/33.  eICU Interventions  Will bolus with 0.9 NaCl 1 liter IV over 1 hour now.      Intervention Category Intermediate Interventions: Hypotension - evaluation and management  Contrina Orona Eugene 09/05/2015, 11:57 PM

## 2015-09-05 NOTE — Progress Notes (Signed)
[facility] Progress Note   Subjective:  Patient slow to respond to verbal stimuli-answering in monosyllables. Knows who she is and that she is in hospital. AFIB with RVR this AM, requiring metoprolol, bolused with Amiodarone, started on Amiodarone @ 60 mg/hr.  Objective Filed Vitals:   09/05/15 0635 09/05/15 0753 09/05/15 0805 09/05/15 0914  BP: 105/55 107/54 105/58 105/52  Pulse:      Temp:      TempSrc:      Resp:      Height:      Weight:      SpO2:       Physical Exam General: Elderly frail appearing female in NAD Heart: S1, S2, irregular No M/R AFIB on monitor-rate 113. Lungs: Bilateral breath decreased in bases. Without WOB Abdomen: Abdomen soft, non-distended. Having copious amounts brown soft stool. Extremities: No LE edema. Heel protectors in place. Dialysis Access: L AVF + Bruit  Dialysis Orders: TTS NW 4 hrs 65.5kgs 2K/2Ca L AVF 1000u hep hectorol 2 venofor 100 q HD until 10/8 then 50 q week Micera 100 q2 weeks - to start 10/6  Additional Objective Labs: Basic Metabolic Panel:  Recent Labs Lab 08/30/15 0327  09/02/15 0612 09/03/15 0420 09/04/15 0430 09/05/15 0248  NA 132*  < > 134* 133* 135 133*  K 4.6  < > 4.0 4.3 4.2 4.7  CL 96*  < > 96* 99* 96* 96*  CO2 27  < > GLUCOSE 127*  < > 136* 64* 127* 209*  BUN 22*  < > 12 22* 11 21*  CREATININE 3.86*  < > 3.06* 4.50* 3.03* 4.41*  CALCIUM 7.8*  < > 7.6* 7.8* 7.7* 8.1*  PHOS 2.8  --  2.2*  --   --   --   < > = values in this interval not displayed. Liver Function Tests:  Recent Labs Lab 08/30/15 0327 09/02/15 0612  ALBUMIN 1.4* 1.5*   No results for input(s): LIPASE, AMYLASE in the last 168 hours. CBC:  Recent Labs Lab 09/01/15 0610 09/02/15 0612 09/03/15 0420 09/04/15 0430 09/05/15 0248  WBC 15.8* 16.3* 20.5* 15.5* 16.8*  HGB 9.0* 7.4* 7.8* 8.0* 7.7*  HCT 27.6* 22.9* 25.1* 25.8* 24.3*  MCV 91.7 92.0 92.6 91.8 91.4  PLT 196 166 209 204 200    Blood Culture    Component Value Date/Time   SDES BLOOD HAND RIGHT 08/21/2015 0800   SPECREQUEST BOTTLES DRAWN AEROBIC ONLY 10CC 08/21/2015 0800   CULT NO GROWTH 5 DAYS 08/21/2015 0800   REPTSTATUS 08/26/2015 FINAL 08/21/2015 0800    Cardiac Enzymes: No results for input(s): CKTOTAL, CKMB, CKMBINDEX, TROPONINI in the last 168 hours. CBG:  Recent Labs Lab 09/04/15 0730 09/04/15 1141 09/04/15 1644 09/04/15 2053 09/05/15 0733  GLUCAP 135* 188* 146* 182* 210*   Iron Studies:  Recent Labs  09/03/15 1455  IRON 21*  TIBC 130*   @ Studies/Results: Ct Abdomen Pelvis Wo Contrast  09/03/2015  CLINICAL DATA:  Followup abnormal ascending colon. EXAM: CT ABDOMEN AND PELVIS WITHOUT CONTRAST TECHNIQUE: Multidetector CT imaging of the abdomen and pelvis was performed following the standard protocol without IV contrast. COMPARISON:  CT scans 10/05 and 08/24/2015 FINDINGS: Lower chest: There are persistent small bilateral pleural effusions, right greater than left with overlying atelectasis. The heart is enlarged but stable. No pericardial effusion. Dense 3 vessel coronary artery calcifications are noted along with dense aortic calcifications. Hepatobiliary: No focal hepatic lesions. Stable prominent intrahepatic vasculature with scattered  calcifications. The gallbladder is slightly distended with some mild surrounding inflammatory change. No common bile duct dilatation. Pancreas: No mass, inflammation or duct dilatation. Moderate atrophy. Spleen: No mass.  Normal size. Adrenals/Urinary Tract: The adrenal glands are normal and stable. Both kidneys are small and demonstrate renal artery calcifications. No worrisome mass or hydronephrosis. Stomach/Bowel: The stomach, duodenum, small bowel and colon appear stable. No findings for small bowel inflammation or obstruction. The terminal ileum is normal. Persistent abnormal appearance of the cecum. There is fairly significant cecal wall  thickening with some component of pneumatosis. This also involves the ascending colon. It may reflect some type of chronic colitis. Ischemic colitis is unlikely given the little change since 08/19/2015. Vascular/Lymphatic: Advanced atherosclerotic calcifications involving the aorta and branch vessels, most notably the renal arteries. No obvious mesenteric or retroperitoneal mass or adenopathy. There is diffuse mesenteric edema and a small amount of abdominal/ pelvic fluid. Anasarca is also noted. Other: Stable enlarged fibroid uterus with calcified fibroids. The bladder is moderately distended. No pelvic mass or adenopathy. No inguinal mass or adenopathy. Musculoskeletal: No significant bony findings. Stable remote L2 fracture and advanced degenerative changes involving the spine IMPRESSION: 1. Persistent small bilateral pleural effusions, right greater than left with overlying atelectasis. 2. Cardiac enlargement and dense 3 vessel coronary artery calcifications. 3. Persistent diffuse wall thickening involving the cecum and ascending colon with patchy pneumatosis coli. This could be chronic colitis. Ischemia is unlikely given the time course of stability. No perforation or abscess. No obstruction. 4. Enlarged fibroid uterus. 5. Mild distention of the bladder. 6. Mesenteric edema, small amount of abdominal and free pelvic fluid and diffuse body wall edema. Electronically Signed   By: Rudie Meyer M.D.   On: 09/03/2015 14:13   Medications: . amiodarone 60 mg/hr (09/05/15 7829)   Followed by  . amiodarone     . sodium chloride   Intravenous Once  . carvedilol  10 mg Oral Daily  . darbepoetin (ARANESP) injection - DIALYSIS  150 mcg Intravenous Q Tue-HD  . doxercalciferol  1 mcg Intravenous Q T,Th,Sa-HD  . enoxaparin (LOVENOX) injection  50 mg Subcutaneous Q24H  . insulin aspart  0-9 Units Subcutaneous TID WC  . levothyroxine  250 mcg Oral QAC breakfast  . midodrine  5 mg Oral TID WC  . multivitamin  1  tablet Oral QHS  . piperacillin-tazobactam (ZOSYN)  IV  2.25 g Intravenous 3 times per day  . pneumococcal 23 valent vaccine  0.5 mL Intramuscular Tomorrow-1000  . pravastatin  40 mg Oral q1800  . sodium chloride  3 mL Intravenous Q12H  . Warfarin - Pharmacist Dosing Inpatient   Does not apply q1800    Assessment/Plan: 1.Fevers / sepsis / ischemic colitis by CT - s/p 7day abx course; became febrile again 102 deg F; zosyn restarted 10/18. Repeat bcx's neg and abd benign on exam. Continue zosyn, having low grade temps.  2 Hypotension - resolved, tapering midodrine 3 Vol exces -resolving, just over dry wt. Last HD 10/20 Net UF 700. BP stable. For HD today. May need to adjust UFG due to excessive diarrhea.  4 Perm afib on coreg/ coumadin restarted managed per pharmacy 5 hx CABG  6 Anemia cont darbe, Hb down 7.7. today. Follow CBC, observe, check Fe tibc. May need transfusion if < 7.0. 7 MBD low P,no binders for now/ cont hect  8 Malnutrition alb 1's  9 Debility - improving some. Working with PT. Possible DC to LTAC/palliative care consulted. 10 AMS -  pt oriented to person and place only. Poorly responsive to verbal.   Dene Gentryita H. Brown NP-C 09/04/2015, 12:16 PM   [facility] [phone]  Pt seen, examined and agree w A/P as above.  Vinson Moselleob Kelten Enochs MD [facility] pager [phone]    cell [phone] 09/05/2015, 11:59 AM

## 2015-09-05 NOTE — Progress Notes (Signed)
Amiodarone Drug - Drug Interaction Consult Note  Recommendations: continue current therapy and monitor QTc, INR, HR.  Amiodarone is metabolized by the cytochrome P450 system and therefore has the potential to cause many drug interactions. Amiodarone has an average plasma half-life of 50 days (range 20 to 100 days).   There is potential for drug interactions to occur several weeks or months after stopping treatment and the onset of drug interactions may be slow after initiating amiodarone.   [x]  Statins: Increased risk of myopathy. Simvastatin- restrict dose to 20mg  daily. Other statins: counsel patients to report any muscle pain or weakness immediately.  [x]  Anticoagulants: Amiodarone can increase anticoagulant effect. Consider warfarin dose reduction. Patients should be monitored closely and the dose of anticoagulant altered accordingly, remembering that amiodarone levels take several weeks to stabilize.  []  Antiepileptics: Amiodarone can increase plasma concentration of phenytoin, the dose should be reduced. Note that small changes in phenytoin dose can result in large changes in levels. Monitor patient and counsel on signs of toxicity.  [x]  Beta blockers: increased risk of bradycardia, AV block and myocardial depression. Sotalol - avoid concomitant use.  []   Calcium channel blockers (diltiazem and verapamil): increased risk of bradycardia, AV block and myocardial depression.  []   Cyclosporine: Amiodarone increases levels of cyclosporine. Reduced dose of cyclosporine is recommended.  []  Digoxin dose should be halved when amiodarone is started.  []  Diuretics: increased risk of cardiotoxicity if hypokalemia occurs.  []  Oral hypoglycemic agents (glyburide, glipizide, glimepiride): increased risk of hypoglycemia. Patient's glucose levels should be monitored closely when initiating amiodarone therapy.   []  Drugs that prolong the QT interval:  Torsades de pointes risk may be increased with  concurrent use - avoid if possible.  Monitor QTc, also keep magnesium/potassium WNL if concurrent therapy can't be avoided. Marland Kitchen. Antibiotics: e.g. fluoroquinolones, erythromycin. . Antiarrhythmics: e.g. quinidine, procainamide, disopyramide, sotalol. . Antipsychotics: e.g. phenothiazines, haloperidol.  . Lithium, tricyclic antidepressants, and methadone. Thank You,  Phillips ClimesDang, Cherye Gaertner Jackson SouthDien  09/05/2015 8:21 AM

## 2015-09-05 NOTE — Progress Notes (Addendum)
PROGRESS NOTE  Subjective:   74 yo with  Chronic Afib ESRD - on HD DM HTN Severe MR Dementia  Now with necrotizing enterocolitis -   Developed rapid atrial fib today - we were asked to see again to help with management    Objective:    Vital Signs:   Temp:  [98.4 F (36.9 C)-100.4 F (38 C)] 99.5 F (37.5 C) (10/22 1100) Pulse Rate:  [97-115] 97 (10/22 0443) Resp:  [18-24] 18 (10/22 0443) BP: (104-117)/(49-61) 111/49 mmHg (10/22 1100) SpO2:  [85 %-100 %] 100 % (10/22 0443) Weight:  [140 lb 14.4 oz (63.912 kg)] 140 lb 14.4 oz (63.912 kg) (10/22 0443)  Last BM Date: 09/05/15   24-hour weight change: Weight change: -15 lb 6.5 oz (-6.988 kg)  Weight trends: Filed Weights   09/03/15 0437 09/03/15 1520 09/05/15 0443  Weight: 147 lb 14.9 oz (67.1 kg) 156 lb 4.9 oz (70.9 kg) 140 lb 14.4 oz (63.912 kg)    Intake/Output:  10/21 0701 - 10/22 0700 In: 200  Out: -  Total I/O In: 1169.6 [P.O.:240; I.V.:929.6] Out: -    Physical Exam: BP 111/49 mmHg  Pulse 97  Temp(Src) 99.5 F (37.5 C) (Oral)  Resp 18  Ht  (1.702 m)  Wt 140 lb 14.4 oz (63.912 kg)  BMI 22.06 kg/m2  SpO2 100%  Wt Readings from Last 3 Encounters:  09/05/15 140 lb 14.4 oz (63.912 kg)  10/28/14 150 lb 11.2 oz (68.357 kg)  10/15/14 145 lb 14.4 oz (66.18 kg)    General: Vital signs reviewed and noted. Very lethergic   Head: Normocephalic, atraumatic.  Eyes: conjunctivae/corneas clear.  EOM's intact.   Throat: normal  Neck:  normal   Lungs:    cleear   Heart:  irreg. Irreg.   Abdomen:  Distended, minimal BS  Extremities: No edema    Neurologic: lethergic   Psych: Sedated     Labs: BMET:  Recent Labs  09/04/15 0430 09/05/15 0248  NA 135 133*  K 4.2 4.7  CL 96* 96*  CO2 28 24  GLUCOSE 127* 209*  BUN 11 21*  CREATININE 3.03* 4.41*  CALCIUM 7.7* 8.1*    Liver function tests: No results for input(s): AST, ALT, ALKPHOS, BILITOT, PROT, ALBUMIN in the last 72  hours. No results for input(s): LIPASE, AMYLASE in the last 72 hours.  CBC:  Recent Labs  09/04/15 0430 09/05/15 0248  WBC 15.5* 16.8*  HGB 8.0* 7.7*  HCT 25.8* 24.3*  MCV 91.8 91.4  PLT 204 200    Cardiac Enzymes: No results for input(s): CKTOTAL, CKMB, TROPONINI in the last 72 hours.  Coagulation Studies:  Recent Labs  09/04/15 0430 09/05/15 0248  LABPROT 19.7* 18.4*  INR 1.67* 1.52*    Other: Invalid input(s): POCBNP No results for input(s): DDIMER in the last 72 hours. No results for input(s): HGBA1C in the last 72 hours. No results for input(s): CHOL, HDL, LDLCALC, TRIG, CHOLHDL in the last 72 hours. No results for input(s): TSH, T4TOTAL, T3FREE, THYROIDAB in the last 72 hours.  Invalid input(s): FREET3  Recent Labs  09/03/15 1455  TIBC 130*  IRON 21*     Other results:  Tele   ( personally reviewed )  Rapid atrial fib     Medications:    Infusions: . amiodarone 60 mg/hr (09/05/15 1610)   Followed by  . amiodarone 30 mg/hr (09/05/15 1415)    Scheduled Medications: . sodium chloride  Intravenous Once  . carvedilol  10 mg Oral Daily  . darbepoetin (ARANESP) injection - DIALYSIS  150 mcg Intravenous Q Tue-HD  . doxercalciferol  1 mcg Intravenous Q T,Th,Sa-HD  . enoxaparin (LOVENOX) injection  50 mg Subcutaneous Q24H  . insulin aspart  0-9 Units Subcutaneous TID WC  . levothyroxine  250 mcg Oral QAC breakfast  . midodrine  5 mg Oral TID WC  . multivitamin  1 tablet Oral QHS  . piperacillin-tazobactam (ZOSYN)  IV  2.25 g Intravenous 3 times per day  . pneumococcal 23 valent vaccine  0.5 mL Intramuscular Tomorrow-1000  . pravastatin  40 mg Oral q1800  . sodium chloride  3 mL Intravenous Q12H  . Warfarin - Pharmacist Dosing Inpatient   Does not apply q1800    Assessment/ Plan:   Principal Problem:   Atrial fibrillation with RVR (HCC) Active Problems:   Long term current use of anticoagulant therapy   Diabetes mellitus, insulin dependent  (IDDM), controlled (HCC)   HTN (hypertension)   CKD (chronic kidney disease) stage V requiring chronic dialysis (HCC)   HLD (hyperlipidemia)   Coronary artery disease   Severe mitral regurgitation   RLQ abdominal pain   Thrombocytopenia (HCC)   Anemia, chronic renal failure   Hypothyroidism   Metabolic encephalopathy   Sepsis (HCC)   Fever, unspecified   Leukocytosis   PAD (peripheral artery disease) (HCC)   Right foot pain   Acute respiratory failure with hypoxia (HCC)   Hemodialysis catheter dysfunction (HCC)   Pneumatosis coli   Hypotension   Ischemia, bowel (HCC)   Palliative care encounter   Pressure ulcer   1. Atrial fib with RVR Was started on amiodarone .  The rapid ventricular response is likely due to her underlying medical issues  This will help with rate control I doubt it will convert her given her underlying medical issues.  She is on Lovenox. I think she is at high risk for coumadin therapy. Will DC for now. Primary team to offer input on this issue   She has been seen by palliative care. Her prognosis is poor      Disposition:  Length of Stay: 5218  Vesta MixerPhilip J. Nahser, Montez HagemanJr., MD, [facility] 09/05/2015, 11:58 AM Office [phone] Pager [phone]

## 2015-09-05 NOTE — Consult Note (Signed)
PULMONARY / CRITICAL CARE MEDICINE   Name: Megan Vasquez MRN: [id number] DOB: 02/15/1941    ADMISSION DATE:  09/07/2015 CONSULTATION DATE:  09/05/2015  REFERRING MD :  Elvera Lennox  Reason for consultation:  Encephalopathy   HISTORY OF PRESENT ILLNESS:  Patient is a 74 year old African-American female past medical history significant for diabetes, hypertension, hyperlipidemia, atrial fibrillation, end-stage renal disease, coronary artery disease who has been hospitalized since 09/01/2015 for intra-abdominal issues. She has had a complicated hospitalization but we were asked to see her this evening for encephalopathy. Over the past several days she has become more confused and less responsive. Upon my arrival to the bedside the patient was unable to answer any medical questions and was only arousable with deep stimulation. I had the nurse check her blood pressure and vital signs where it was noted that she had maps in the 40s and heart rate 100-120 in atrial fibrillation. At that time a provided her with a fluid bolus and was able to discuss the patient with several members of the nursing staff who had been taking care of her during her hospitalization and are able to confirm that she has deteriorated over the last couple days. I called the patient's daughter, Megan Vasquez, who stated that she would like [place] to continue investigating the etiology of the hypotension.  PAST MEDICAL HISTORY :   has a past medical history of Diabetes mellitus without complication (HCC); Hypertension; Hyperlipidemia; Atrial fibrillation (HCC); ESRD (end stage renal disease) (HCC); CAD (coronary artery disease); S/P CABG (coronary artery bypass graft) (01/18/2006); History of nuclear stress test (10/2012); Peripheral arterial disease (HCC); Osteoporosis; and Mitral regurgitation.  has past surgical history that includes AV fistula placement (2007); transthoracic echocardiogram (12/2010); Lower Extremity Arterial Doppler (10/02/2013);  Cardiac catheterization (12/19/2005); Coronary artery bypass graft (01/18/2006); Carotid Doppler (02/24/2009); and abdominal aortagram (N/A, 03/24/2014). Prior to Admission medications   Medication Sig Start Date End Date Taking? Authorizing Provider  calcium acetate (PHOSLO) 667 MG capsule Take 1,334 mg by mouth 3 (three) times daily with meals.  12/21/13  Yes Historical Provider, MD  diltiazem (CARDIZEM CD) 240 MG 24 hr capsule TAKE ONE CAPSULE BY MOUTH DAILY 05/19/15  Yes Runell Gess, MD  insulin glargine (LANTUS) 100 UNIT/ML injection Inject 10 Units into the skin 2 (two) times daily. 10 units twice a day   Yes Historical Provider, MD  levothyroxine (SYNTHROID, LEVOTHROID) 200 MCG tablet Take 200 mcg by mouth daily before breakfast. Take with to equal total dose of   Yes Historical Provider, MD  levothyroxine (SYNTHROID, LEVOTHROID) 50 MCG tablet Take 50 mcg by mouth daily. Take  With to equal 10/14/14  Yes Historical Provider, MD  metoprolol succinate (TOPROL-XL) 50 MG 24 hr tablet Take 1/2 tablet ( ) by mouth daily. Patient taking differently: Take 25 mg by mouth daily.  11/17/14  Yes Runell Gess, MD  pravastatin (PRAVACHOL) 40 MG tablet Take 40 mg by mouth every morning.    Yes Historical Provider, MD  warfarin (COUMADIN) 3 MG tablet Take 4.5-6 mg by mouth daily. Take 2 tablets ( ) on Monday, and 1 1/2 (4.5MG ) tablet all other days   Yes Historical Provider, MD  acetaminophen (TYLENOL) 325 MG tablet Take 325 mg by mouth every 6 (six) hours as needed for moderate pain.    Historical Provider, MD  Calcium Carbonate (CALCIUM 600 PO) Take 1 tablet by mouth 3 (three) times daily.     Historical Provider, MD  diltiazem (CARDIZEM CD) 240 MG  24 hr capsule Take 240 mg by mouth daily.    Historical Provider, MD  lidocaine-prilocaine (EMLA) cream Apply 1 application topically Every Tuesday,Thursday,and Saturday with dialysis.    Historical Provider, MD  metoprolol succinate  (TOPROL-XL) 50 MG 24 hr tablet Take 25 mg by mouth daily. Take with or immediately following a meal.    Historical Provider, MD  OVER THE COUNTER MEDICATION Apply 1 application topically Every Tuesday,Thursday,and Saturday with dialysis. Diabetic foot cream used for HD    Historical Provider, MD  warfarin (COUMADIN) 3 MG tablet Take 1.5 to 2 tablets by mouth daily as directed by coumadin clinic Patient not taking: Reported on 08/15/2015 01/05/15   Lennette Bihari, MD  warfarin (COUMADIN) 3 MG tablet TAKE 1.5 TO 2 TABLETS BY MOUTH DAILY AS DIRECTED BY COUMADIN CLINIC Patient not taking: Reported on 09/08/2015 08/17/15   Runell Gess, MD   No Known Allergies  FAMILY HISTORY:  has no family status information on file.  SOCIAL HISTORY:  reports that she quit smoking about 6 years ago. She has never used smokeless tobacco. She reports that she does not drink alcohol or use illicit drugs.  REVIEW OF SYSTEMS:  Unable to obtain secondary to the patient's encephalopathy  SUBJECTIVE:   VITAL SIGNS: Temp:  [98.4 F (36.9 C)-100.6 F (38.1 C)] 98.7 F (37.1 C) (10/23 0035) Pulse Rate:  [82-126] 82 (10/22 2330) Resp:  [17-47] 28 (10/23 0245) BP: (68-123)/(26-72) 69/38 mmHg (10/23 0245) SpO2:  [98 %-100 %] 100 % (10/22 2330) Weight:  [63.912 kg (140 lb 14.4 oz)] 63.912 kg (140 lb 14.4 oz) (10/22 0443) HEMODYNAMICS:   VENTILATOR SETTINGS:   INTAKE / OUTPUT:  Intake/Output Summary (Last 24 hours) at 09/06/15 0251 Last data filed at 09/06/15 0200  Gross per 24 hour  Intake 7795.83 ml  Output      0 ml  Net 7795.83 ml    PHYSICAL EXAMINATION: General: 74 year old African-American female who appears older than her stated age and appears to be distressed from her tachypnea. She is arousable but otherwise is not alert or awake. She does not appear to be oriented. She appears malnourished but well-developed Neuro:  Cranial nerves II-XII grossly intact. PERRL. EOMI. Sensation is intact and  strength 0/5 but likely secondary to participation.Gait was not assessed due to critical illness. Patient did not withdraw her bilateral upper extremities to painful stimuli. She did not withdraw her left lower extremity to painful stimuli. She did withdraw her right lower extremity to painful stimuli  HEENT: Head, normocephalic, atraumatic. Ears symmetric, permeable. Eyes: no conjunctival icterus, no erythema. Nose: permeable, midline septum Cardiovascular: S1S2 irregularly irregular tachycardic. no murmurs, rubs, or gallops auscultated. No thrills palpated.  Lungs:  Chest symmetrical with respirations, scattered rhonchi to auscultation bilaterally with no wheezing, crackles, equal expansion. She is tachypneic Abdomen:  Firm, nontender, no guarding. Diminished bowel sounds. Patient does not grimace to palpation Musculoskeletal:  muscle atrophy noted. Patient appeared to be weak in all 4 extremities  Skin:  No notable scars, rashes, cruises. No bed sores noted.  Lymphatics: no palpable lymphadenopathy of cervical, supraclvicular nor inguinal areas.    LABS:  CBC  Recent Labs Lab 09/03/15 0420 09/04/15 0430 09/05/15 0248  WBC 20.5* 15.5* 16.8*  HGB 7.8* 8.0* 7.7*  HCT 25.1* 25.8* 24.3*  PLT 209 204 200   Coag's  Recent Labs Lab 09/04/15 0430 09/05/15 0248  INR 1.67* 1.52*   BMET  Recent Labs Lab 09/03/15 0420 09/04/15 0430 09/05/15 0248  NA 133* 135 133*  K 4.3 4.2 4.7  CL 99* 96* 96*  CO2 25 28 24   BUN 22* 11 21*  CREATININE 4.50* 3.03* 4.41*  GLUCOSE 64* 127* 209*   Electrolytes  Recent Labs Lab 08/30/15 0327  09/02/15 0612 09/03/15 0420 09/04/15 0430 09/05/15 0248  CALCIUM 7.8*  < > 7.6* 7.8* 7.7* 8.1*  PHOS 2.8  --  2.2*  --   --   --   < > = values in this interval not displayed. Sepsis Markers No results for input(s): LATICACIDVEN, PROCALCITON, O2SATVEN in the last 168 hours. ABG  Recent Labs Lab 09/05/15 1605  PHART 7.522*  PCO2ART 25.9*   PO2ART 69.5*   Liver Enzymes  Recent Labs Lab 08/30/15 0327 09/02/15 0612  ALBUMIN 1.4* 1.5*   Cardiac Enzymes  Recent Labs Lab 09/05/15 2110  TROPONINI 0.09*   Glucose  Recent Labs Lab 09/04/15 1644 09/04/15 2053 09/05/15 0733 09/05/15 1124 09/05/15 1533 09/05/15 2015  GLUCAP 146* 182* 210* 262* 197* 121*    Imaging Dg Chest Port 1 View  09/05/2015  CLINICAL DATA:  Shortness of breath, end-stage renal disease, diabetes mellitus, hypertension, coronary artery disease post CABG, atrial fibrillation EXAM: PORTABLE CHEST 1 VIEW COMPARISON:  Oral exam 1546 hours compared to 08/24/2015 FINDINGS: RIGHT jugular central venous catheter with tip projecting over SVC. Enlargement of cardiac silhouette with pulmonary vascular congestion post CABG. Stable mediastinal contours. RIGHT pleural effusion and basilar atelectasis. Minimal atelectasis LEFT base. No definite acute infiltrate or failure. No pneumothorax. IMPRESSION: Enlargement of cardiac silhouette with pulmonary vascular congestion post CABG. Bibasilar atelectasis RIGHT greater than LEFT with RIGHT pleural effusion. Electronically Signed   By: Ulyses SouthwardMark  Boles M.D.   On: 09/05/2015 16:16     ASSESSMENT / PLAN:  PULMONARY A: Tachypnea of unknown etiology. ABG with a respiratory alkalosis. Bilateral pleural effusion P:  Patient is a DO NOT INTUBATE. We'll continue to monitor and try to address etiology behind tachypnea. I have ordered a CT of the chest   CARDIOVASCULAR CVL right IJ A: Hypotension on Midodrine for currently requiring IV fluid resuscitation boluses Atrial fibrillation with rapid ventricular rate on an amiodarone drip.  History of coronary artery disease status post CABG P: Patient may need vasopressors, follow up cardiology recommendations, continue amiodarone, will likely hold a.m. carvedilol due to hypotension   RENAL A:  End-stage renal disease on hemodialysis with last dialysis session provided on  10/20 Hyponatremia, mild P:  Monitor and follow up nephrology recommendations. Patient may need CRRT   GASTROINTESTINAL A:  Diarrhea Status post necrotizing enterocolitis Protein caloric malnutrition P:  Continue loperamide, flexiseal. Follow-up ammonia level. 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 Patient is a DO NOT INTUBATE. I am concerned about this patient's long term prognosis  given her multiple organ failures with this hypotension of uncertain etiology   FAMILY  - Updates: I called the patient's daughter, Megan Vasquez, and explained to her the severity of her mother's current state of health. I expressed that she needed to be transferred immediately to the ICU and that her blood pressure was very low. I was also quite concerned about her encephalopathy which Megan Vasquez attributed to having elevated blood sugars in the 200s. Stressed that the patient's blood sugars were under better control and that I was more concerned about her hypotension. Megan Vasquez attributed this to her Coreg that was given at 7:00 this morning.   - Inter-disciplinary family meet or Palliative Care meeting due by: This week  Critical care time 42 minutes providing direct  care to the patient, fluid resuscitation, lab interpretation, and discussion with family.   Deetta Perla, MD Critical Care Medicine [facility] Pulmonary/Critical Care   Pulmonary and Critical Care Medicine [facility] Pager: [phone]     09/05/2015, 8:56 PM

## 2015-09-05 NOTE — Progress Notes (Addendum)
PROGRESS NOTE  Megan Vasquez FQH:[id number] DOB: 12-22-1940 DOA: 08/20/2015 PCP: Maximino Greenland, MD  HPI: 74 yo female patient chronic kidney disease on dialysis on Tuesday Thursday Saturday, chronic atrial fibrillation on warfarin and rate control, chronic kidney disease with anemia, mild baseline chronic thrombocytopenia (platelets are typically around 120,000), diabetes on insulin, hypertension, recent diagnosis of severe mitral regurgitation asymptomatic December 2015 per echocardiogram, hypothyroidism, dyslipidemia and known CAD. Patient was brought to the hospital today by her grandson because of altered mentation abrupt onset.  10/4 CT scan on admission with concerns for pneumatosis along the cecum and ascending colon at the right lower quadrant, of uncertain significance, with mild associated wall thickening and soft tissue inflammation, ?early necrotizing enterocolitis 10/8 patient progressively worsening with increased leukocytosis, hypotension and decreasing mentation. Transferred to ICU for CRRT 10/13 improving, transferred to [facility] 10/19 fever curve worsening, restarted antibiotics 10/20 repeat CT scan with chronic colitis without perforation or abscess 10/22 A fib with RVR with rates 170-180s 10/22 worsening mental status, rapid response called, PCCM consulted  Subjective / 24 H Interval events - worsening rates of her A fib this morning 170-180s sustained, patient without complaints - denies chest pain / palpitations  Assessment/Plan: Principal Problem:   Atrial fibrillation with RVR ([place]) Active Problems:   Long term current use of anticoagulant therapy   Diabetes mellitus, insulin dependent (IDDM), controlled (HCC)   HTN (hypertension)   CKD (chronic kidney disease) stage V requiring chronic dialysis (HCC)   HLD (hyperlipidemia)   Coronary artery disease   Severe mitral regurgitation   RLQ abdominal pain   Thrombocytopenia (HCC)   Anemia, chronic renal failure    Hypothyroidism   Metabolic encephalopathy   Sepsis (HCC)   Fever, unspecified   Leukocytosis   PAD (peripheral artery disease) (HCC)   Right foot pain   Acute respiratory failure with hypoxia ([place])   Hemodialysis catheter dysfunction (HCC)   Pneumatosis coli   Hypotension   Ischemia, bowel (HCC)   Palliative care encounter   Pressure ulcer  Afib with RVR - CHad2-Vasc2~4 [8%/yr risk], continue Coreg, cardiology saw patient and s/o 10/11, continue heparin, transition to Coumadin - decompensating with worsening rates this morning, sustaining into 170-180s, asymptomatic - discussed with cardiology, will start Amiodarone and change status to stepdown. Appreciate input - I am not entirely clear of trigger, ? Fluid overload but she is not more hypoxic and is not complaining of dyspnea. HD today   Addendum: Acute encephalopathy - called to the patient room for lethargy and decreased mentation, as well as tachypnea. Rapid response called by Dr. Jonnie Finner - patient lethargic and tachypneic, lungs with mild crackles, no wheezing, she is tachycardic, irregular - CXR with mild edema, will obtain ABG / CBC / BMP / troponin - PCCM consulted, may need ICU level care - updated family at bedside. Attempts to approach goals of care have been met with resistance as previously noted in multiple palliative care meetings. I expressed my concerns to the family that she is not improving and after 18 days in the hospital things do not appear to get better. While they understand this, they wish for the patient to receive full medical management.   Patient's daughter main concern at this time is her CBG, which is not optimally controlled at 197 and she feels like this is driving everything else.   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 Johney Maine note and on my  evaluation as well. Patient has been on IV antibiotics 10/4 -  10/11 with improvement. She became again febrile on 10/19 to 102.8 and Zosyn was restarted. Repeat CT scan of the abdomen on 10/20 showed chronic colitis, less likely ischemic given time course of stability, and without perforation or abscess. Will plan for a 7 day total course of repeat Zosyn for chronic colitis with end date on 10/26. She briefly had diarrhea, which is improving, C diff was negative.  - no abdominal pain this morning, fairly benign abdominal exam, this appears stable  Anemia of acute illness - possibly due to #1 in the setting of febrile illness and underlying CKD, FOBT negative. She was transfused 2U pRBC 10/6 and 10/14, 2U FFP, Hb stable, no evidence of bleeding, started Coumadin for A fib and will need bridging with heparin iv  - FOBT today returned positive, with her A fib worsening question possibility of a GI bleed   Fever - ?due to #1 vs other source, she has no respiratory complaints and other than intraabdominal process there are no other obvious sources. Zosyn for 7 days as above.   ESRD - nephrology following, on IHD. Early during admission, due to ongoing hypotension she was transferred temorarily to the ICU for CRRT. This has now improved.   DM type 2 - change to Q4 hr at request of daughter who wishes [place] to check the cbg q4 d, discontinued Lantus as persistently hypoglycemic  Hypothyroidism - Levothyroxine 250 mcg  Mod-severe MV regurgitation / PAH - medical management-not candidate operative  HLD - cont prvachol 40 daily  Metabolic encephalopathy - see above, worsening this afternoon  Diet: Diet full liquid Room service appropriate?: Yes; Fluid consistency:: Thin Fluids: none DVT Prophylaxis: heparin  Code Status: Partial Code Family Communication: d/w daughter bedside  Disposition Plan: remain inpatient  Barriers to discharge: worsening clinical condition  Consultants:  Cardiology  Nephrology  General surgery  Palliative   Procedures:  None     Antibiotics 1. Vanc + Zosyn 10/4-->10/11 2. Zosyn 10/19 >>   Studies  Ct Abdomen Pelvis Wo Contrast  09/03/2015  CLINICAL DATA:  Followup abnormal ascending colon. EXAM: CT ABDOMEN AND PELVIS WITHOUT CONTRAST TECHNIQUE: Multidetector CT imaging of the abdomen and pelvis was performed following the standard protocol without IV contrast. COMPARISON:  CT scans 10/05 and 08/24/2015 FINDINGS: Lower chest: There are persistent small bilateral pleural effusions, right greater than left with overlying atelectasis. The heart is enlarged but stable. No pericardial effusion. Dense 3 vessel coronary artery calcifications are noted along with dense aortic calcifications. Hepatobiliary: No focal hepatic lesions. Stable prominent intrahepatic vasculature with scattered calcifications. The gallbladder is slightly distended with some mild surrounding inflammatory change. No common bile duct dilatation. Pancreas: No mass, inflammation or duct dilatation. Moderate atrophy. Spleen: No mass.  Normal size. Adrenals/Urinary Tract: The adrenal glands are normal and stable. Both kidneys are small and demonstrate renal artery calcifications. No worrisome mass or hydronephrosis. Stomach/Bowel: The stomach, duodenum, small bowel and colon appear stable. No findings for small bowel inflammation or obstruction. The terminal ileum is normal. Persistent abnormal appearance of the cecum. There is fairly significant cecal wall thickening with some component of pneumatosis. This also involves the ascending colon. It may reflect some type of chronic colitis. Ischemic colitis is unlikely given the little change since 08/19/2015. Vascular/Lymphatic: Advanced atherosclerotic calcifications involving the aorta and branch vessels, most notably the renal arteries. No obvious mesenteric or retroperitoneal mass or adenopathy. There is diffuse mesenteric edema  and a small amount of abdominal/ pelvic fluid. Anasarca is also noted. Other: Stable  enlarged fibroid uterus with calcified fibroids. The bladder is moderately distended. No pelvic mass or adenopathy. No inguinal mass or adenopathy. Musculoskeletal: No significant bony findings. Stable remote L2 fracture and advanced degenerative changes involving the spine IMPRESSION: 1. Persistent small bilateral pleural effusions, right greater than left with overlying atelectasis. 2. Cardiac enlargement and dense 3 vessel coronary artery calcifications. 3. Persistent diffuse wall thickening involving the cecum and ascending colon with patchy pneumatosis coli. This could be chronic colitis. Ischemia is unlikely given the time course of stability. No perforation or abscess. No obstruction. 4. Enlarged fibroid uterus. 5. Mild distention of the bladder. 6. Mesenteric edema, small amount of abdominal and free pelvic fluid and diffuse body wall edema. Electronically Signed   By: Marijo Sanes M.D.   On: 09/03/2015 14:13    Objective  Filed Vitals:   09/05/15 0443 09/05/15 0635 09/05/15 0753 09/05/15 0805  BP: 117/61 105/55 107/54 105/58  Pulse: 97     Temp: 98.4 F (36.9 C)     TempSrc: Oral     Resp: 18     Height:      Weight: 63.912 kg (140 lb 14.4 oz)     SpO2: 100%       Intake/Output Summary (Last 24 hours) at 09/05/15 0913 Last data filed at 09/05/15 2878  Gross per 24 hour  Intake    440 ml  Output      0 ml  Net    440 ml   Filed Weights   09/03/15 0437 09/03/15 1520 09/05/15 0443  Weight: 67.1 kg (147 lb 14.9 oz) 70.9 kg (156 lb 4.9 oz) 63.912 kg (140 lb 14.4 oz)   Exam:  GENERAL: NAD  HEENT: head NCAT, no scleral icterus.   NECK: Supple. No LAD  LUNGS: Clear to auscultation. No wheezing or crackles  HEART: Regular rate and rhythm without murmur. Decreased pulse on right LE, no JVD, no peripheral edema  ABDOMEN: Soft, non-distended, non-tender. Positive bowel sounds.  EXTREMITIES: Without any cyanosis or clubbing. Good muscle tone  NEUROLOGIC: non focal   Data  Reviewed: Basic Metabolic Panel:  Recent Labs Lab 08/30/15 0327  09/01/15 0610 09/02/15 0612 09/03/15 0420 09/04/15 0430 09/05/15 0248  NA 132*  < > 128* 134* 133* 135 133*  K 4.6  < > 3.9 4.0 4.3 4.2 4.7  CL 96*  < > 93* 96* 99* 96* 96*  CO2 27  < > _0 GLUCOSE 127*  < > 203* 136* 64* 127* 209*  BUN 22*  < > 17 12 22* 11 21*  CREATININE 3.86*  < > 3.65* 3.06* 4.50* 3.03* 4.41*  CALCIUM 7.8*  < > 7.3* 7.6* 7.8* 7.7* 8.1*  PHOS 2.8  --   --  2.2*  --   --   --   < > = values in this interval not displayed. Liver Function Tests:  Recent Labs Lab 08/30/15 0327 09/02/15 0612  ALBUMIN 1.4* 1.5*   CBC:  Recent Labs Lab 09/01/15 0610 09/02/15 0612 09/03/15 0420 09/04/15 0430 09/05/15 0248  WBC 15.8* 16.3* 20.5* 15.5* 16.8*  HGB 9.0* 7.4* 7.8* 8.0* 7.7*  HCT 27.6* 22.9* 25.1* 25.8* 24.3*  MCV 91.7 92.0 92.6 91.8 91.4  PLT 196 166 209 204 200   BNP (last 3 results)  Recent Labs  09/06/2015 1120  BNP 1556.0*   CBG:  Recent Labs Lab  09/04/15 0730 09/04/15 1141 09/04/15 1644 09/04/15 2053 09/05/15 0733  GLUCAP 135* 188* 146* 182* 210*    Recent Results (from the past 240 hour(s))  C difficile quick scan w PCR reflex     Status: None   Collection Time: 09/02/15  7:54 AM  Result Value Ref Range Status   C Diff antigen NEGATIVE NEGATIVE Final   C Diff toxin NEGATIVE NEGATIVE Final   C Diff interpretation Negative for toxigenic C. difficile  Final     Scheduled Meds: . sodium chloride   Intravenous Once  . carvedilol  10 mg Oral Daily  . darbepoetin (ARANESP) injection - DIALYSIS  150 mcg Intravenous Q Tue-HD  . doxercalciferol  1 mcg Intravenous Q T,Th,Sa-HD  . enoxaparin (LOVENOX) injection  50 mg Subcutaneous Q24H  . insulin aspart  0-9 Units Subcutaneous TID WC  . levothyroxine  250 mcg Oral QAC breakfast  . midodrine  5 mg Oral TID WC  . multivitamin  1 tablet Oral QHS  . piperacillin-tazobactam (ZOSYN)  IV  2.25 g Intravenous 3 times  per day  . pneumococcal 23 valent vaccine  0.5 mL Intramuscular Tomorrow-1000  . pravastatin  40 mg Oral q1800  . sodium chloride  3 mL Intravenous Q12H  . Warfarin - Pharmacist Dosing Inpatient   Does not apply q1800   Continuous Infusions: . amiodarone 60 mg/hr (09/05/15 8138)   Followed by  . amiodarone     Marzetta Board, MD [facility] Pager [phone]. If 7 PM - 7 AM, please contact night-coverage at [email or web address], password [facility] 09/05/2015, 9:13 AM  LOS: 18 days

## 2015-09-05 NOTE — Progress Notes (Signed)
After loss of IV access called Dr. Elvera LennoxGherghe and was told to call  Dr. Arlean HoppingSchertz so that HD cath could be accessed to continue Amioderone. Pts breathing became tachypeniac. Dr Arlean HoppingSchertz came to see pt. Patient lying in bed on nasal cannula 2 LPM, lethargic however easily arousable- slow to respond. HR 150's, amio drip being restarted as PIV was lost.breathing RR 24.HR 120-150's. EKG done, PCXR done, ABG done, labs ordered. Primary MD at bedside. Pt states to be asymptomatic of palpitations. BP 105/52. Awaiting orders, will continue to monitor.

## 2015-09-05 NOTE — Progress Notes (Signed)
Pt HR back up in 170s. 5mg  of IV lopressor given. Pt down to 140s HR, BP 123/71. Denies palpitations. Breathing more labored than before, sats 99% 2L. Will continue to monitor. Dr. Elvera LennoxGherghe made aware.

## 2015-09-05 NOTE — Progress Notes (Signed)
ANTICOAGULATION CONSULT NOTE - Follow Up Consult  Pharmacy Consult:  Lovenox / Coumadin Indication: atrial fibrillation  No Known Allergies  Patient Measurements: Height: 5\' 7"  (170.2 cm) Weight: 140 lb 14.4 oz (63.912 kg) IBW/kg (Calculated) : 61.6  Vital Signs: Temp: 98.4 F (36.9 C) (10/22 0443) Temp Source: Oral (10/22 0443) BP: 105/52 mmHg (10/22 0914) Pulse Rate: 97 (10/22 0443)  Labs:  Recent Labs  09/02/15 2225  09/03/15 0420 09/03/15 1455 09/04/15 0430 09/05/15 0248  HGB  --   < > 7.8*  --  8.0* 7.7*  HCT  --   --  25.1*  --  25.8* 24.3*  PLT  --   --  209  --  204 200  LABPROT  --   --   --   --  19.7* 18.4*  INR  --   --   --   --  1.67* 1.52*  HEPARINUNFRC 2.01*  --  <0.10* 1.06*  --   --   CREATININE  --   --  4.50*  --  3.03* 4.41*  < > = values in this interval not displayed.  Estimated Creatinine Clearance: 10.6 mL/min (by C-G formula based on Cr of 4.41).      Assessment: 74 YOF on Coumadin PTA for history of Afib.  Patient was transitioned to IV heparin upon admit due to AMS, and was switched to Lovenox on 09/03/15 due to hard stick and inaccurate heparin levels.  Coumadin was also resumed on 09/03/15 but patient has not received any doses.  Her INR is sub-therapeutic as expected.  She is hemoccult positive but negative for overt bleeding.  Cards thinks Afib with RVR is due to underlying medical issues.  [facility] on the safe side and hold anticoagulation for now.   Goal of Therapy:  Anti-Xa level 0.6-1 units/ml 4hrs after LMWH dose given  INR  2 - 3 Monitor platelets by anticoagulation protocol: Yes  Resolution of infection    Plan:  - Hold all anticoagulation per MD - F/U in AM   Drezden Seitzinger D. Laney Potashang, PharmD, BCPS Pager:  [phone] 09/05/2015, 12:25 PM

## 2015-09-05 NOTE — Progress Notes (Signed)
Pts HR tached up to 180s, oral dose 10mg  of Coreg given to pt. 5mg  of PRN 1x dose Lopressor given. HR 130-160. BP 105/58. Pt asymptomatic. Dr. Elvera LennoxGherghe has been made aware and seen pt.

## 2015-09-05 NOTE — Significant Event (Signed)
Rapid Response Event Note  Overview: Time Called: 1530 Arrival Time: 1535 Event Type: Respiratory, Cardiac  Initial Focused Assessment:  Called by Dr. Arlean HoppingSchertz to come see patient.  Upon my arrival to patients room, MD and RN and Family at bedside.  Patient is lying in bed on nasal cannula 2 LPM, lethargic however easily arousable- slow to respond.  HR 150's, amio drip being restarted as PIV was lost.  Patient with labored breathing RR 24.  Skin warm and dry   Interventions:  IV team at bedside attempting PIV and were successful.  105/52, 120-150's.  EKG done, PCXR done, ABG done, labs ordered.  Patient may benefit from BIPAP.  Primary MD at bedside     Event Summary:  RN to call if assistance needed   at      at          [facility] GarnerWolfe, Maryagnes Amosenise Ann

## 2015-09-06 ENCOUNTER — Encounter (HOSPITAL_COMMUNITY): Payer: Self-pay

## 2015-09-06 ENCOUNTER — Inpatient Hospital Stay (HOSPITAL_COMMUNITY): Payer: Medicare PPO

## 2015-09-06 DIAGNOSIS — R579 Shock, unspecified: Secondary | ICD-10-CM

## 2015-09-06 DIAGNOSIS — G9340 Encephalopathy, unspecified: Secondary | ICD-10-CM

## 2015-09-06 LAB — CBC
HCT: 23 % — ABNORMAL LOW (ref 36.0–46.0)
HEMOGLOBIN: 7.1 g/dL — AB (ref 12.0–15.0)
MCH: 29.2 pg (ref 26.0–34.0)
MCHC: 30.9 g/dL (ref 30.0–36.0)
MCV: 94.7 fL (ref 78.0–100.0)
Platelets: 152 10*3/uL (ref 150–400)
RBC: 2.43 MIL/uL — AB (ref 3.87–5.11)
RDW: 17.4 % — ABNORMAL HIGH (ref 11.5–15.5)
WBC: 18.7 10*3/uL — AB (ref 4.0–10.5)

## 2015-09-06 LAB — BASIC METABOLIC PANEL
ANION GAP: 12 (ref 5–15)
BUN: 28 mg/dL — AB (ref 6–20)
CHLORIDE: 99 mmol/L — AB (ref 101–111)
CO2: 20 mmol/L — ABNORMAL LOW (ref 22–32)
Calcium: 7.5 mg/dL — ABNORMAL LOW (ref 8.9–10.3)
Creatinine, Ser: 5.41 mg/dL — ABNORMAL HIGH (ref 0.44–1.00)
GFR calc Af Amer: 8 mL/min — ABNORMAL LOW (ref >60)
GFR calc non Af Amer: 7 mL/min — ABNORMAL LOW (ref >60)
GLUCOSE: 147 mg/dL — AB (ref 65–99)
POTASSIUM: 5.3 mmol/L — AB (ref 3.5–5.1)
Sodium: 131 mmol/L — ABNORMAL LOW (ref 135–145)

## 2015-09-06 LAB — GLUCOSE, CAPILLARY
GLUCOSE-CAPILLARY: 224 mg/dL — AB (ref 65–99)
Glucose-Capillary: 210 mg/dL — ABNORMAL HIGH (ref 65–99)
Glucose-Capillary: 197 mg/dL — ABNORMAL HIGH (ref 65–99)
Glucose-Capillary: 199 mg/dL — ABNORMAL HIGH (ref 65–99)

## 2015-09-06 LAB — CORTISOL: CORTISOL PLASMA: 35.9 ug/dL

## 2015-09-06 MED ORDER — NOREPINEPHRINE BITARTRATE 1 MG/ML IV SOLN
2.0000 ug/min | INTRAVENOUS | Status: DC
  Administered 2015-09-06: 19 ug/min via INTRAVENOUS
  Administered 2015-09-06: 2 ug/min via INTRAVENOUS
  Administered 2015-09-06: 14 ug/min via INTRAVENOUS
  Administered 2015-09-06: 16 ug/min via INTRAVENOUS
  Filled 2015-09-06 (×4): qty 4

## 2015-09-06 MED ORDER — NOREPINEPHRINE BITARTRATE 1 MG/ML IV SOLN
0.0000 ug/min | INTRAVENOUS | Status: AC
  Administered 2015-09-06: 19 ug/min via INTRAVENOUS
  Administered 2015-09-07: 18 ug/min via INTRAVENOUS
  Filled 2015-09-06 (×2): qty 16

## 2015-09-06 MED ORDER — HYDROCORTISONE NA SUCCINATE PF 100 MG IJ SOLR
50.0000 mg | Freq: Four times a day (QID) | INTRAMUSCULAR | Status: DC
  Administered 2015-09-06: 50 mg via INTRAVENOUS
  Filled 2015-09-06 (×2): qty 2

## 2015-09-06 MED ORDER — WHITE PETROLATUM GEL
Status: AC
  Administered 2015-09-06: 0.2
  Filled 2015-09-06: qty 1

## 2015-09-06 MED ORDER — MORPHINE SULFATE (PF) 2 MG/ML IV SOLN: 2.0000 mg | INTRAVENOUS | Status: DC | PRN

## 2015-09-06 MED ORDER — VANCOMYCIN HCL 10 G IV SOLR
1500.0000 mg | INTRAVENOUS | Status: DC
  Administered 2015-09-06: 1500 mg via INTRAVENOUS
  Filled 2015-09-06: qty 1500

## 2015-09-06 NOTE — Progress Notes (Signed)
Dr. Sharen HonesGutierrez in to evaluate patient.  Pt remains lethargic.  Opens eyes to her name being called, but otherwise unresponsive except to painful stimuli.  Vitals obtained per Dr. Nicanor AlconGutierrez's request.  Pt hypotensive with SBP in the 70s. NS bolus 500 ml given per MD order.  BP 70s-80s/30s after fluid bolus.  CT scans ordered, and pt to be transferred to ICU per MD order.  Will continue to monitor.  [name], [name]

## 2015-09-06 NOTE — Progress Notes (Signed)
PULMONARY / CRITICAL CARE MEDICINE   Name: Megan Vasquez MRN: [id number] DOB: [birth year]    ADMISSION DATE:  09-10-15 CONSULTATION DATE:  09/05/2015  REFERRING MD :  Elvera Lennox  Reason for consultation:  Encephalopathy  BRIEF SUMMARY: 74 year old African-American female past medical history significant for diabetes, hypertension, hyperlipidemia, atrial fibrillation, end-stage renal disease, coronary artery disease who has been hospitalized since 09-10-2015 for intra-abdominal issues. She has had a complicated hospitalization, PCCM re-consulted 10/22 pm for encephalopathy.   SUBJECTIVE: RN reports increased requirements of levophed, currently at 14 mcg, amio infusing.  Pt denies pain  VITAL SIGNS: Temp:  [97.3 F (36.3 C)-100.6 F (38.1 C)] 97.3 F (36.3 C) (10/23 0700) Pulse Rate:  [82-126] 82 (10/22 2330) Resp:  [17-47] 31 (10/23 0730) BP: (68-123)/(25-72) 102/36 mmHg (10/23 0730) SpO2:  [98 %-100 %] 100 % (10/23 0700) Weight:  [151 lb 14.4 oz (68.9 kg)] 151 lb 14.4 oz (68.9 kg) (10/23 0448)   HEMODYNAMICS:   VENTILATOR SETTINGS:   INTAKE / OUTPUT:  Intake/Output Summary (Last 24 hours) at 09/06/15 0833 Last data filed at 09/06/15 0700  Gross per 24 hour  Intake 8945.36 ml  Output      0 ml  Net 8945.36 ml    PHYSICAL EXAMINATION: General: chronically ill appearing female Neuro:  Cranial nerves II-XII grossly intact. PERRL. EOMI. Speech clear, generalized weakness but moves all extremities  HEENT: Head, normocephalic, atraumatic. Eyes: no conjunctival icterus, no erythema.  Cardiovascular: S1S2 irregularly irregular tachycardic. no murmurs, rubs, or gallops auscultated.  Lungs:  Even/non-labored, lungs bilaterally clear, diminished lower Abdomen:  Firm, nontender, no guarding. Diminished bowel sounds. Patient does not grimace to palpation Musculoskeletal:  No acute deformities, muscle atrophy noted.  Skin:  No notable scars, rashes, cruises. No bed sores noted.   Lymphatics: no palpable lymphadenopathy of cervical, supraclvicular areas   LABS:  CBC  Recent Labs Lab 09/04/15 0430 09/05/15 0248 09/06/15 0232  WBC 15.5* 16.8* 18.7*  HGB 8.0* 7.7* 7.1*  HCT 25.8* 24.3* 23.0*  PLT 204 200 152   Coag's  Recent Labs Lab 09/04/15 0430 09/05/15 0248  INR 1.67* 1.52*   BMET  Recent Labs Lab 09/04/15 0430 09/05/15 0248 09/06/15 0232  NA 135 133* 131*  K 4.2 4.7 5.3*  CL 96* 96* 99*  CO2 28 24 20*  BUN 11 21* 28*  CREATININE 3.03* 4.41* 5.41*  GLUCOSE 127* 209* 147*   Electrolytes  Recent Labs Lab 09/02/15 0612  09/04/15 0430 09/05/15 0248 09/06/15 0232  CALCIUM 7.6*  < > 7.7* 8.1* 7.5*  PHOS 2.2*  --   --   --   --   < > = values in this interval not displayed. Sepsis Markers No results for input(s): LATICACIDVEN, PROCALCITON, O2SATVEN in the last 168 hours. ABG  Recent Labs Lab 09/05/15 1605  PHART 7.522*  PCO2ART 25.9*  PO2ART 69.5*   Liver Enzymes  Recent Labs Lab 09/02/15 0612  ALBUMIN 1.5*   Cardiac Enzymes  Recent Labs Lab 09/05/15 2110  TROPONINI 0.09*   Glucose  Recent Labs Lab 09/04/15 2053 09/05/15 0733 09/05/15 1124 09/05/15 1533 09/05/15 2015 09/06/15 0724  GLUCAP 182* 210* 262* 197* 121* 210*    Imaging Dg Chest Port 1 View  09/05/2015  CLINICAL DATA:  Shortness of breath, end-stage renal disease, diabetes mellitus, hypertension, coronary artery disease post CABG, atrial fibrillation EXAM: PORTABLE CHEST 1 VIEW COMPARISON:  Oral exam 1546 hours compared to 08/24/2015 FINDINGS: RIGHT jugular central venous catheter with tip projecting over  SVC. Enlargement of cardiac silhouette with pulmonary vascular congestion post CABG. Stable mediastinal contours. RIGHT pleural effusion and basilar atelectasis. Minimal atelectasis LEFT base. No definite acute infiltrate or failure. No pneumothorax. IMPRESSION: Enlargement of cardiac silhouette with pulmonary vascular congestion post CABG.  Bibasilar atelectasis RIGHT greater than LEFT with RIGHT pleural effusion. Electronically Signed   By: Ulyses SouthwardMark  Boles M.D.   On: 09/05/2015 16:16     ASSESSMENT / PLAN:  PULMONARY A:  Tachypnea -  unknown etiology, resolved.  Respiratory Alkalosis - ? Pain  Bilateral pleural effusion P:   Patient is a DO NOT INTUBATE.  CT Chest pending  Pulmonary hygiene Oxygen as needed to supports sats > 92%  CARDIOVASCULAR HD Cath Right IJ 10/8 >> A:  Hypotension on Midodrine for currently requiring IV fluid resuscitation boluses Atrial fibrillation with rapid ventricular rate - on an amiodarone drip.  History of coronary artery disease status post CABG P:  Levophed for MAP > 65 Assess cortisol  Add stress dose steroids Continue amiodarone  Hold Coreg 10/23 (on CR 10 mg) given hypotension  PRN lopressor for HR > 130  Cardiology following, appreciate input  RENAL A:   End-stage renal disease on hemodialysis - last HD on 10/20 Hyponatremia, mild P:   Monitor BMP Replace electrolytes as indicted Appreciate Renal assistance  May need CRRT given hypotension   GASTROINTESTINAL A:   Diarrhea - CDiff negative S/P ABD pain / R colon pneumatosis consistent with ischemic colitis Protein caloric malnutrition P:   Continue loperamide, flexiseal.  Ammonia wnl Follow up CT abdomen and pelvis to investigate the etiology of this hypotension Liquid diet as tolerated  Patient and family want NO SURGERY even if non-op path leads to death.  High co-morbidities even if she chose surgery.   HEMATOLOGIC A:   Anemia of chronic disease  P:  Monitor CBC Lovenox therapy  Deemed high risk for coumadin therapy   INFECTIOUS A:   Ischemic colitis on Zosyn P:  Infectious work up   MirantBCx2 10/23 >> UA 10/23 >> C-diff 10/19 >> negative  Zosyn, start date 09/02/2015 Vanco, start date 10/22 >>   ENDOCRINE A:   Diabetes mellitus type 2  Hypothyroidism - on levothyroxine P:   SSI  Continue  synthroid  NEUROLOGIC A:   Toxic metabolic encephalopathy secondary to hypotension, critical illness P:   CT of the head to rule out CVA, rule out other metabolic causes   GLOBAL: Palliative care is on board. Patient is a DO NOT INTUBATE. Very concerned about her long term prognosis given her multiple organ failures with this hypotension of uncertain etiology.     FAMILY  - Updates: Daughter, [name], updated 10/22 via phone per Dr. Ruta HindsGutierrex.  No family available 10/23 am.   - Inter-disciplinary family meet or Palliative Care meeting due by:  12/24   Canary BrimBrandi Brinly Maietta, NP-C [facility] Pgr: [phone] or if no answer [phone] 09/06/2015, 8:34 AM

## 2015-09-06 NOTE — Progress Notes (Signed)
Report given to [facility] nurse.  Pt transported via bed to room 2H05 by RN and NT.  Family notified of transfer by Dr. Sharen HonesGutierrez.  Megan Vasquez, Dorsie Burich Gray

## 2015-09-06 NOTE — Progress Notes (Signed)
PROGRESS NOTE  Subjective:   74 yo with  Chronic Afib ESRD - on HD DM HTN Severe MR Dementia  Now with necrotizing enterocolitis -   Developed rapid atrial fib  - we were asked to see again to help with management  Has been moved to CCU. Hypotensive, coreg has been held     Objective:    Vital Signs:   Temp:  [97.3 F (36.3 C)-100.6 F (38.1 C)] 97.3 F (36.3 C) (10/23 0700) Pulse Rate:  [82-126] 82 (10/22 2330) Resp:  [17-47] 31 (10/23 0730) BP: (68-123)/(25-72) 102/36 mmHg (10/23 0730) SpO2:  [98 %-100 %] 100 % (10/23 0700) Weight:  [151 lb 14.4 oz (68.9 kg)] 151 lb 14.4 oz (68.9 kg) (10/23 0448)  Last BM Date: 09/06/15   24-hour weight change: Weight change: 11 lb (4.988 kg)  Weight trends: Filed Weights   09/03/15 1520 09/05/15 0443 09/06/15 0448  Weight: 156 lb 4.9 oz (70.9 kg) 140 lb 14.4 oz (63.912 kg) 151 lb 14.4 oz (68.9 kg)    Intake/Output:  10/22 0701 - 10/23 0700 In: 9185.4 [P.O.:1070; I.V.:6265.4; IV Piggyback:1850] Out: -      Physical Exam: BP 102/36 mmHg  Pulse 82  Temp(Src) 97.3 F (36.3 C) (Oral)  Resp 31  Ht 5\' 7"  (1.702 m)  Wt 151 lb 14.4 oz (68.9 kg)  BMI 23.78 kg/m2  SpO2 100%  Wt Readings from Last 3 Encounters:  09/06/15 151 lb 14.4 oz (68.9 kg)  10/28/14 150 lb 11.2 oz (68.357 kg)  10/15/14 145 lb 14.4 oz (66.18 kg)    General: Vital signs reviewed and noted.  Alseep, did not wake up during my exam ( not on any sedation )   Head: Normocephalic, atraumatic.  Eyes: conjunctivae/corneas clear.  EOM's intact.   Throat: normal  Neck:  normal   Lungs:    clear   Heart:  irreg. Irreg.   Abdomen:  Distended, minimal BS  Extremities: No edema    Neurologic: lethergic   Psych: Sedated     Labs: BMET:  Recent Labs  09/05/15 0248 09/06/15 0232  NA 133* 131*  K 4.7 5.3*  CL 96* 99*  CO2 24 20*  GLUCOSE 209* 147*  BUN 21* 28*  CREATININE 4.41* 5.41*  CALCIUM 8.1* 7.5*    Liver function tests: No  results for input(s): AST, ALT, ALKPHOS, BILITOT, PROT, ALBUMIN in the last 72 hours. No results for input(s): LIPASE, AMYLASE in the last 72 hours.  CBC:  Recent Labs  09/05/15 0248 09/06/15 0232  WBC 16.8* 18.7*  HGB 7.7* 7.1*  HCT 24.3* 23.0*  MCV 91.4 94.7  PLT 200 152    Cardiac Enzymes:  Recent Labs  09/05/15 2110  TROPONINI 0.09*    Coagulation Studies:  Recent Labs  09/04/15 0430 09/05/15 0248  LABPROT 19.7* 18.4*  INR 1.67* 1.52*    Other: Invalid input(s): POCBNP No results for input(s): DDIMER in the last 72 hours. No results for input(s): HGBA1C in the last 72 hours. No results for input(s): CHOL, HDL, LDLCALC, TRIG, CHOLHDL in the last 72 hours. No results for input(s): TSH, T4TOTAL, T3FREE, THYROIDAB in the last 72 hours.  Invalid input(s): FREET3  Recent Labs  09/03/15 1455  TIBC 130*  IRON 21*     Other results:  Tele   ( personally reviewed )  Atrial fib,  Normal rate      Medications:    Infusions: . amiodarone 30 mg/hr (09/06/15 0819)  .  norepinephrine (LEVOPHED) Adult infusion 16 mcg/min (09/06/15 0902)    Scheduled Medications: . darbepoetin (ARANESP) injection - DIALYSIS  150 mcg Intravenous Q Tue-HD  . doxercalciferol  1 mcg Intravenous Q T,Th,Sa-HD  . hydrocortisone sod succinate (SOLU-CORTEF) inj  50 mg Intravenous Q6H  . insulin aspart  0-9 Units Subcutaneous TID WC  . levothyroxine  250 mcg Oral QAC breakfast  . midodrine  5 mg Oral TID WC  . multivitamin  1 tablet Oral QHS  . piperacillin-tazobactam (ZOSYN)  IV  2.25 g Intravenous 3 times per day  . pneumococcal 23 valent vaccine  0.5 mL Intramuscular Tomorrow-1000  . pravastatin  40 mg Oral q1800  . sodium chloride  3 mL Intravenous Q12H  . vancomycin  1,500 mg Intravenous Q24H    Assessment/ Plan:   Principal Problem:   Atrial fibrillation with RVR (HCC) Active Problems:   Long term current use of anticoagulant therapy   Diabetes mellitus, insulin  dependent (IDDM), controlled (HCC)   HTN (hypertension)   CKD (chronic kidney disease) stage V requiring chronic dialysis (HCC)   HLD (hyperlipidemia)   Coronary artery disease   Severe mitral regurgitation   RLQ abdominal pain   Thrombocytopenia (HCC)   Anemia, chronic renal failure   Hypothyroidism   Metabolic encephalopathy   Sepsis (HCC)   Fever, unspecified   Leukocytosis   PAD (peripheral artery disease) (HCC)   Right foot pain   Acute respiratory failure with hypoxia (HCC)   Hemodialysis catheter dysfunction (HCC)   Pneumatosis coli   Hypotension   Ischemia, bowel (HCC)   Palliative care encounter   Pressure ulcer   Acute encephalopathy   1. Atrial fib with RVR Was started on amiodarone .  The rapid ventricular response is likely due to her underlying medical issues  Coreg has been held due to hypotension  I doubt it will convert her given her underlying medical issues.  She is on Lovenox. I think she is at high risk for coumadin therapy. Will DC for now. Primary team to offer input on this issue  Going for CT scan    She has been seen by palliative care. Her prognosis is poor      Disposition:  Length of Stay: 51  Vesta Mixer, Montez Hageman., MD, [facility] 09/06/2015, 9:46 AM Office [phone] Pager [phone]

## 2015-09-06 NOTE — Progress Notes (Signed)
Pt is to have a CT of her head, chest, abd/pelvis but has been too unstable to come down this evening.  BP is not good and she just cannot take the trip.  Her RN sts that she will call us as soon as we are able to get the scans done

## 2015-09-06 NOTE — Progress Notes (Addendum)
[facility] Progress Note   Subjective: heart rate better today, not tachypneic anymore.  Just got back from CT which shows worsening fluid collections and new foci of gas. Denies abd pain  Filed Vitals:   09/06/15 1115 09/06/15 1130 09/06/15 1200 09/06/15 1230  BP: 123/42 141/35 112/63 121/43  Pulse:      Temp:  97.4 F (36.3 C)    TempSrc:  Oral    Resp: 27 28 32 22  Height:      Weight:      SpO2:       Exam: General: Elderly frail appearing female in NAD Heart: S1, S2, irregular No M/R Lungs: Bilateral breath decreased in bases. Without WOB Abdomen: Abdomen soft, dec'd BS Extremities: No LE edema. Heel protectors in place. Dialysis Access: L AVF + Bruit  Dialysis Orders: TTS NW 4 hrs 65.5kgs 2K/2Ca L AVF 1000u hep hectorol 2 venofor 100 q HD until 10/8 then 50 q week Micera 100 q2 weeks - to start 10/6  CT scan today > again R sided colon wall thickening with pneumatosis but with interval progression of perihepatic fluid collection and new scattered foci of gas. Per radiologist interpretation the appearance suggests macroscopic localized perforation. Also comments that current appearance is worrisome for necrotic/ nonviable colon.    Assessment: 1.Fevers / sepsis / ischemic colitis- on admission, got 7d abx, not a surgical candidate. Then became febrile again several days ago. Abx restarted. Yesterday deteriorated with tachypnea/ tachycardia w afib and moved to ICU.  CT today results as above. Have d/w primary. Do not recommend CRRT given severe comorbidities. Primary to d/w family.  3 Vol - up 2-4 kg by wt 3 Perm afib on coreg/ coumadin restarted managed per pharmacy 4 hx CABG  5 Anemia cont darbe, Hb down 7.7. today. Follow CBC, observe, check Fe tibc. May need transfusion if < 7.0. 6 MBD low P,no binders for now/ cont hect  7 Malnutrition alb 1's   Vinson Moselleob Dwain Huhn MD [facility] pager [phone]    cell  [phone] 09/06/2015, 3:09 PM    Recent Labs Lab 09/02/15 0612  09/04/15 0430 09/05/15 0248 09/06/15 0232  NA 134*  < > 135 133* 131*  K 4.0  < > 4.2 4.7 5.3*  CL 96*  < > 96* 96* 99*  CO2 26  < > 28 24 20*  GLUCOSE 136*  < > 127* 209* 147*  BUN 12  < > 11 21* 28*  CREATININE 3.06*  < > 3.03* 4.41* 5.41*  CALCIUM 7.6*  < > 7.7* 8.1* 7.5*  PHOS 2.2*  --   --   --   --   < > = values in this interval not displayed.  Recent Labs Lab 09/02/15 0612  ALBUMIN 1.5*    Recent Labs Lab 09/04/15 0430 09/05/15 0248 09/06/15 0232  WBC 15.5* 16.8* 18.7*  HGB 8.0* 7.7* 7.1*  HCT 25.8* 24.3* 23.0*  MCV 91.8 91.4 94.7  PLT 204 200 152   . darbepoetin (ARANESP) injection - DIALYSIS  150 mcg Intravenous Q Tue-HD  . doxercalciferol  1 mcg Intravenous Q T,Th,Sa-HD  . hydrocortisone sod succinate (SOLU-CORTEF) inj  50 mg Intravenous Q6H  . insulin aspart  0-9 Units Subcutaneous TID WC  . levothyroxine  250 mcg Oral QAC breakfast  . midodrine  5 mg Oral TID WC  . multivitamin  1 tablet Oral QHS  . piperacillin-tazobactam (ZOSYN)  IV  2.25 g Intravenous 3 times per day  .  pneumococcal 23 valent vaccine  0.5 mL Intramuscular Tomorrow-1000  . pravastatin  40 mg Oral q1800  . sodium chloride  3 mL Intravenous Q12H   . amiodarone 30 mg/hr (09/06/15 0819)  . norepinephrine (LEVOPHED) Adult infusion 16 mcg/min (09/06/15 1246)   sodium chloride, acetaminophen, acetaminophen, loperamide, metoprolol, ondansetron **OR** ondansetron (ZOFRAN) IV, sodium chloride, sodium chloride, sodium chloride

## 2015-09-06 NOTE — Progress Notes (Signed)
Called daughter [name] to discuss goals of care given findings on CT scan.  CT shows a worsening of fluid collections and gas.  The patient denies pain / discomfort.  However, she has had progressive needs for vasopressor support as the day has progressed. Nephrology as indicated that she is not a candidate for further CVVHD / HD.  Updated daughter & daughter in law over the phone.  They are in agreement for no further escalation of care.  However, the live approximately one hour away.   Plan: Cap levophed at current dose, 19 mcg.  No further titration up.  PRN morphine for evidence of discomfort  Continue antibiotics for now.   Comfort feeding if patient desires Oxygen as needed for comfort Limit further lab draws / invasive interventions DNR order placed   Canary BrimBrandi Ollis, NP-C [facility] Pgr: [phone] or if no answer [phone] 09/06/2015, 5:27 PM

## 2015-09-06 NOTE — Progress Notes (Signed)
eLink Physician-Brief Progress Note Patient Name: Megan Vasquez DOB: 03/30/1941 MRN: [id number]   Date of Service  09/06/2015  HPI/Events of Note  Diarrhea - Request for Flexiseal.  eICU Interventions  Will order Flexiseal placed.      Intervention Category Minor Interventions: Routine modifications to care plan (e.g. PRN medications for pain, fever)  Bradyn Soward Eugene 09/06/2015, 2:15 AM

## 2015-09-06 NOTE — Progress Notes (Signed)
eLink Physician-Brief Progress Note Patient Name: Megan MaudlinMarjorie Vasquez DOB: 05/16/1941 MRN: [id number]   Date of Service  09/06/2015  HPI/Events of Note  Remains hypotensive post 0.9 NaCl bolus - BP = 74/48.  eICU Interventions  Will order Norepinephrine IV infusion. Titrate to MAP >= 75.     Intervention Category Intermediate Interventions: Hypotension - evaluation and management  Ahsley Attwood Eugene 09/06/2015, 1:19 AM

## 2015-09-06 NOTE — Progress Notes (Signed)
Spoke to pt's daughter Aggie Cosierheresa and her SO Denny Peonrin on the phone. Offered supportive listening and updated them as to current clinical data. They are anxious to see what CT scan will show that is being done today. They state that Mrs. Lorraine LaxLockhart has had this trajectory before where she was on the floor, had an acute change then transferred to ICU and also CRRT. I did not engage them in further decision making at this point, but goal of conversation for Palliative Medicine to be able to maintain a presence as this goes forward.  Thank you, Eduard RouxSarah Danzell Birky, ANP-ACHPN

## 2015-09-06 NOTE — Progress Notes (Signed)
ANTIBIOTIC CONSULT NOTE - INITIAL  Pharmacy Consult for Vancomycin Indication: rule out sepsis/necrotizing enterocolistis  No Known Allergies  Patient Measurements: Height:  (170.2 cm) Weight: 151 lb 14.4 oz (68.9 kg) IBW/kg (Calculated) : 61.6  Vital Signs: Temp: 97.4 F (36.3 C) (10/23 1130) Temp Source: Oral (10/23 1130) BP: 121/43 mmHg (10/23 1230) Intake/Output from previous day: 10/22 0701 - 10/23 0700 In: 9185.4 [P.O.:1070; I.V.:6265.4; IV Piggyback:1850] Out: -  Intake/Output from this shift: Total I/O In: 313.2 [I.V.:313.2] Out: -   Labs:  Recent Labs  09/04/15 0430 09/05/15 0248 09/06/15 0232  WBC 15.5* 16.8* 18.7*  HGB 8.0* 7.7* 7.1*  PLT 204 200 152  CREATININE 3.03* 4.41* 5.41*   Estimated Creatinine Clearance: 8.6 mL/min (by C-G formula based on Cr of 5.41). No results for input(s): VANCOTROUGH, VANCOPEAK, VANCORANDOM, GENTTROUGH, GENTPEAK, GENTRANDOM, TOBRATROUGH, TOBRAPEAK, TOBRARND, AMIKACINPEAK, AMIKACINTROU, AMIKACIN in the last 72 hours.   Microbiology: Recent Results (from the past 720 hour(s))  MRSA PCR Screening     Status: None   Collection Time: 11-Sep-2015  9:05 AM  Result Value Ref Range Status   MRSA by PCR NEGATIVE NEGATIVE Final    Comment:        The GeneXpert MRSA Assay (FDA approved for NASAL specimens only), is one component of a comprehensive MRSA colonization surveillance program. It is not intended to diagnose MRSA infection nor to guide or monitor treatment for MRSA infections.   Urine culture     Status: None   Collection Time: 09/11/2015 12:27 PM  Result Value Ref Range Status   Specimen Description URINE, CLEAN CATCH  Final   Special Requests NONE  Final   Culture MULTIPLE SPECIES PRESENT, SUGGEST RECOLLECTION  Final   Report Status 08/19/2015 FINAL  Final  Culture, blood (routine x 2)     Status: None   Collection Time: 11-Sep-2015  8:20 PM  Result Value Ref Range Status   Specimen Description BLOOD RIGHT WRIST   Final   Special Requests IN PEDIATRIC BOTTLE 3CC  Final   Culture NO GROWTH 5 DAYS  Final   Report Status 08/23/2015 FINAL  Final  Culture, blood (routine x 2)     Status: None   Collection Time: 09/11/15  8:30 PM  Result Value Ref Range Status   Specimen Description BLOOD RIGHT HAND  Final   Special Requests IN PEDIATRIC BOTTLE 3CC  Final   Culture NO GROWTH 5 DAYS  Final   Report Status 08/23/2015 FINAL  Final  Culture, blood (routine x 2)     Status: None   Collection Time: 08/21/15  7:50 AM  Result Value Ref Range Status   Specimen Description BLOOD HAND RIGHT  Final   Special Requests BOTTLES DRAWN AEROBIC AND ANAEROBIC 5CC  Final   Culture NO GROWTH 5 DAYS  Final   Report Status 08/26/2015 FINAL  Final  Culture, blood (routine x 2)     Status: None   Collection Time: 08/21/15  8:00 AM  Result Value Ref Range Status   Specimen Description BLOOD HAND RIGHT  Final   Special Requests BOTTLES DRAWN AEROBIC ONLY 10CC  Final   Culture NO GROWTH 5 DAYS  Final   Report Status 08/26/2015 FINAL  Final  MRSA PCR Screening     Status: None   Collection Time: 08/22/15  3:26 PM  Result Value Ref Range Status   MRSA by PCR NEGATIVE NEGATIVE Final    Comment:        The GeneXpert  MRSA Assay (FDA approved for NASAL specimens only), is one component of a comprehensive MRSA colonization surveillance program. It is not intended to diagnose MRSA infection nor to guide or monitor treatment for MRSA infections.   C difficile quick scan w PCR reflex     Status: None   Collection Time: 09/02/15  7:54 AM  Result Value Ref Range Status   C Diff antigen NEGATIVE NEGATIVE Final   C Diff toxin NEGATIVE NEGATIVE Final   C Diff interpretation Negative for toxigenic C. difficile  Final    Medical History: Past Medical History  Diagnosis Date  . Diabetes mellitus without complication (HCC)   . Hypertension   . Hyperlipidemia   . Atrial fibrillation (HCC)   . ESRD (end stage renal disease)  (HCC)     HD since 06/2000  . CAD (coronary artery disease)   . 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 History of nuclear stress test 10/2012    lexiscan; normal stress test; post-stress EF72%  . Peripheral arterial disease (HCC)   . Osteoporosis   . Mitral regurgitation     severe by 2-D echocardiogram performed 10/22/14    Medications:  Scheduled:  . darbepoetin (ARANESP) injection - DIALYSIS  150 mcg Intravenous Q Tue-HD  . doxercalciferol  1 mcg Intravenous Q T,Th,Sa-HD  . hydrocortisone sod succinate (SOLU-CORTEF) inj  50 mg Intravenous Q6H  . insulin aspart  0-9 Units Subcutaneous TID WC  . levothyroxine  250 mcg Oral QAC breakfast  . midodrine  5 mg Oral TID WC  . multivitamin  1 tablet Oral QHS  . piperacillin-tazobactam (ZOSYN)  IV  2.25 g Intravenous 3 times per day  . pneumococcal 23 valent vaccine  0.5 mL Intramuscular Tomorrow-1000  . pravastatin  40 mg Oral q1800  . sodium chloride  3 mL Intravenous Q12H   Infusions:  . amiodarone 30 mg/hr (09/06/15 0819)  . norepinephrine (LEVOPHED) Adult infusion 16 mcg/min (09/06/15 1246)   Assessment: 74 yo F w/ ESRD on HD presenting on 09/01/2015 with 2d history of lethargy, decreased appetite and AMS. 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 On 10/22, patient became more altered and with continued low grade fevers and WBC up to 18.7, antibiotics were broadened to include vancomycin. Loading dose of Vancomycin 1500 mg IV x 1 was given. Last HD on 10/20, now considering CRRT. Will continue to dose vancomycin based on dialysis plans.   Zosyn 10/5 >> 10/10, restart 10/19 >>  Vanc 10/7 >> 10/10, restart 10/23 >>  Eraxis 10/8 >> 10/10   10/4 BCX x2: Neg  10/4 Urine Cx: mult. species, suggest recollection  10/4 MRSA PCR: neg  10/7 BCx x2: Neg  10/19 Cdiff neg  10/23  BCx2>>   Goal of Therapy:  Target pre-HD level 15-25 mcg/mL Target post-HD level 5-15 mcg/mL  Plan:  - Will re-dose vancomycin based on nephro plans for dialysis  - Continue to monitor temp, WBC, C&S  Iana Buzan K. Bonnye FavaNicolsen, PharmD, BCPS, CPP Clinical Pharmacist Pager: [phone] Phone: [phone] 09/06/2015 1:46 PM

## 2015-09-07 DIAGNOSIS — R109 Unspecified abdominal pain: Secondary | ICD-10-CM | POA: Insufficient documentation

## 2015-09-07 DIAGNOSIS — R1010 Upper abdominal pain, unspecified: Secondary | ICD-10-CM

## 2015-09-07 LAB — GLUCOSE, CAPILLARY
Glucose-Capillary: 268 mg/dL — ABNORMAL HIGH (ref 65–99)
Glucose-Capillary: 283 mg/dL — ABNORMAL HIGH (ref 65–99)

## 2015-09-07 LAB — BLOOD GAS, ARTERIAL
Acid-base deficit: 1.5 mmol/L (ref 0.0–2.0)
Bicarbonate: 21.1 mEq/L (ref 20.0–24.0)
Drawn by: 331761
O2 Content: 2 L/min
O2 Saturation: 91.9 %
PCO2 ART: 26.1 mmHg — AB (ref 35.0–45.0)
PO2 ART: 70.5 mmHg — AB (ref 80.0–100.0)
Patient temperature: 99
TCO2: 21.9 mmol/L (ref 0–100)
pH, Arterial: 7.518 — ABNORMAL HIGH (ref 7.350–7.450)

## 2015-09-07 MED ORDER — LEVOTHYROXINE SODIUM 50 MCG PO TABS
250.0000 ug | ORAL_TABLET | Freq: Every day | ORAL | Status: DC
  Administered 2015-09-07 – 2015-09-09 (×2): 250 ug via ORAL
  Filled 2015-09-07: qty 2
  Filled 2015-09-07: qty 1
  Filled 2015-09-07: qty 2
  Filled 2015-09-07: qty 1
  Filled 2015-09-07: qty 2

## 2015-09-07 MED ORDER — FENTANYL CITRATE (PF) 100 MCG/2ML IJ SOLN
25.0000 ug | INTRAMUSCULAR | Status: DC | PRN
  Administered 2015-09-08 – 2015-09-09 (×7): 50 ug via INTRAVENOUS
  Filled 2015-09-07 (×7): qty 2

## 2015-09-07 NOTE — Progress Notes (Signed)
Patient moved to [facility] 05 with oral and written handoff provided by Roddie McBryant Campbell, LCSWA.  Current dc plan appears to be that RNCM is seeking an LTAC bed for patient as patient cannot sit up for full dialysis treatment.  SNF is not an option unless she can do this.  CSW will monitor and assist as needed.  Lorri Frederickonna T. Jaci LazierCrowder, KentuckyLCSW [phone]

## 2015-09-07 NOTE — Progress Notes (Signed)
Daily Progress Note   Patient Name: Megan Vasquez       Date: 09/07/2015 DOB: 1941-10-16  Age: 74 y.o. MRN#: [id number] Attending Physician: Brand Males, MD Primary Care Physician: Maximino Greenland, MD Admit Date: 09/01/2015  Reason for Consultation/Follow-up: GOC  Subjective: I met today with Megan Vasquez daughters [name] and [name] and [name]'s partner [name]. We discussed her decline requiring ICU, CT abd results, and that she can no longer tolerate dialysis. They understand time is very limited. I provided emotional support. They are very concerned about how she will react and handle this. We discussed what to expect and that she may only live days. We discussed that nothing we do will change the outcome. I encouraged them to speak with Megan Vasquez.   Late entry: I met today with Megan Vasquez, and Megan Vasquez. I attempted to explain to Megan Vasquez that she is worse and is not going to get better even with antibiotics and all the support we are giving her. Unfortunately she appears to be more confused today. Although I am unsure how much is confusion versus shutting down from our conversation. She is much quicker to answer my questions that are unrelated to her declining health. I spoke with family about minimizing medications, stopping CBGs, not escalating care, and getting her out of ICU to a more comfortable regular room. Family agrees with this approach. Discussed with RN and with Dr. Titus Mould.   Interval Events: 10/23: Worsening CT abd with likely perforation, DNR  Length of Stay: 20 days  Current Medications: Scheduled Meds:  . insulin aspart  0-9 Units Subcutaneous TID WC  . levothyroxine  250 mcg Oral QAC breakfast  .  piperacillin-tazobactam (ZOSYN)  IV  2.25 g Intravenous 3 times per day  . pneumococcal 23 valent vaccine  0.5 mL Intramuscular Tomorrow-1000  . sodium chloride  3 mL Intravenous Q12H    Continuous Infusions: . norepinephrine (LEVOPHED) Adult infusion 18 mcg/min (09/07/15 0700)    PRN Meds: sodium chloride, acetaminophen, acetaminophen, loperamide, metoprolol, morphine injection, ondansetron **OR** ondansetron (ZOFRAN) IV, sodium chloride, sodium chloride, sodium chloride  Physical Exam: Physical Exam  Constitutional: She appears lethargic. No distress.  HENT:  Head: Normocephalic and atraumatic.  Cardiovascular: Tachycardia present.   Pulmonary/Chest: Effort normal. No accessory muscle usage.  No respiratory distress.  Abdominal: There is tenderness.  Neurological: She appears lethargic. She is disoriented.  Psychiatric: She is withdrawn.  Flat affect                Vital Signs: BP 123/49 mmHg  Pulse 115  Temp(Src) 97.9 F (36.6 C) (Oral)  Resp 38  Ht 5' 7"  (1.702 m)  Wt 71.8 kg (158 lb 4.6 oz)  BMI 24.79 kg/m2  SpO2 97% SpO2: SpO2: 97 % O2 Device: O2 Device: Not Delivered O2 Flow Rate: O2 Flow Rate (L/min): 2 L/min  Intake/output summary:  Intake/Output Summary (Last 24 hours) at 09/07/15 1257 Last data filed at 09/07/15 1100  Gross per 24 hour  Intake 1645.01 ml  Output      0 ml  Net 1645.01 ml   Baseline Weight: Weight: 64.3 kg (141 lb 12.1 oz) Most recent weight: Weight: 71.8 kg (158 lb 4.6 oz)       Palliative Assessment/Data: Flowsheet Rows        Most Recent Value   Intake Tab    Referral Department  Hospitalist   Unit at Time of Referral  Cardiac/Telemetry Unit   Palliative Care Primary Diagnosis  Cardiac   Date Notified  08/27/15   Palliative Care Type  New Palliative care   Reason for referral  Clarify Goals of Care   Date of Admission  09/14/2015   # of days IP prior to Palliative referral  9   Clinical Assessment    Psychosocial & Spiritual  Assessment    Palliative Care Outcomes       Additional Data Reviewed: Recent Labs     09/05/15  0248  09/06/15  0232  WBC  16.8*  18.7*  HGB  7.7*  7.1*  PLT  200  152  NA  133*  131*  BUN  21*  28*  CREATININE  4.41*  5.41*     Problem List:  Patient Active Problem List   Diagnosis Date Noted  . Shock circulatory ([place]) 09/06/2015  . Acute encephalopathy   . Pressure ulcer 09/03/2015  . Palliative care encounter   . Hypotension   . Ischemia, bowel ([place])   . Pneumatosis coli   . Acute respiratory failure with hypoxia ([place])   . Hemodialysis catheter dysfunction ([place])   . Sepsis ([place]) 08/21/2015  . Fever, unspecified 08/21/2015  . Leukocytosis 08/21/2015  . PAD (peripheral artery disease) ([place]) 08/21/2015  . Right foot pain 08/21/2015  . RLQ abdominal pain 09/02/2015  . Thrombocytopenia ([place]) 08/16/2015  . Anemia, chronic renal failure 09/04/2015  . Hypothyroidism 08/28/2015  . Metabolic encephalopathy 35/36/1443  . Severe mitral regurgitation 10/15/2014  . Coronary artery disease 10/18/2013  . Peripheral arterial disease ([place]) 10/18/2013  . Diabetes mellitus, insulin dependent (IDDM), controlled ([place]) 06/10/2013  . HTN (hypertension) 06/10/2013  . CKD (chronic kidney disease) stage V requiring chronic dialysis ([place]) 06/10/2013  . HLD (hyperlipidemia) 06/10/2013  . Atrial fibrillation with RVR ([place]) 02/01/2013  . Long term current use of anticoagulant therapy 02/01/2013     Palliative Care Assessment & Plan    1.Code Status:  DNR    Code Status Orders        Start     Ordered   09/06/15 1727  Do not attempt resuscitation (DNR)   Continuous    Question Answer Vasquez  In the event of cardiac or respiratory ARREST Do not call a "code blue"   In the event of cardiac or respiratory ARREST  Do not perform Intubation, CPR, defibrillation or ACLS   In the event of cardiac or respiratory ARREST Use medication by any route, position, wound care, and other  measures to relive pain and suffering. May use oxygen, suction and manual treatment of airway obstruction as needed for comfort.      09/06/15 1727       2. Goals of Care:  Focus is on comfort and not escalating care.   Desire for further Chaplaincy support: no  Psycho-social Needs: Family struggling with EOL for Megan Vasquez and need much support. Will benefit from hospice bereavement support.   3. Symptom Management:      1. Pain/dyspnea: Fentanyl 25-50 mcg every 2 hours prn.   4. Palliative Prophylaxis:   Frequent Pain Assessment  5. Prognosis: Hours - Days  6. Discharge Planning:  Anticipate hospital death vs hospice facility.    Thank you for allowing the Palliative Medicine Team to assist in the care of this patient.   Time In/Out: 5366-4403, 1540-1630 Total Time 18mn Prolonged Time Billed  yes        APershing Proud NP  [id number]/[id number] 12:57 PM  Please contact Palliative Medicine Team phone at [phone] questions and concerns.

## 2015-09-07 NOTE — Progress Notes (Signed)
PULMONARY / CRITICAL CARE MEDICINE   Name: Megan Vasquez MRN: [id number] DOB: January 19, 1941    ADMISSION DATE:  09/05/2015 CONSULTATION DATE:  09/05/2015  REFERRING MD :  Elvera Lennox  Reason for consultation:  Encephalopathy  BRIEF SUMMARY: 74 year old African-American female past medical history significant for diabetes, hypertension, hyperlipidemia, atrial fibrillation, end-stage renal disease, coronary artery disease who has been hospitalized since 09/03/2015 for intra-abdominal issues. She has had a complicated hospitalization, PCCM re-consulted 10/22 pm for encephalopathy.   SUBJECTIVE: no pain, levo at 19  VITAL SIGNS: Temp:  [97.4 F (36.3 C)-100.2 F (37.9 C)] 98.8 F (37.1 C) (10/24 0747) Pulse Rate:  [62-133] 115 (10/24 0747) Resp:  [21-42] 30 (10/24 0747) BP: (63-146)/(14-96) 123/43 mmHg (10/24 0747) SpO2:  [98 %-100 %] 98 % (10/24 0747) Weight:  [71.8 kg (158 lb 4.6 oz)] 71.8 kg (158 lb 4.6 oz) (10/24 0500)   HEMODYNAMICS:   VENTILATOR SETTINGS:   INTAKE / OUTPUT:  Intake/Output Summary (Last 24 hours) at 09/07/15 1002 Last data filed at 09/07/15 0920  Gross per 24 hour  Intake 1460.41 ml  Output      0 ml  Net 1460.41 ml    PHYSICAL EXAMINATION: General: chronically ill appearing female Neuro:  Cranial nerves II-XII grossly intact. PERRL. EOMI. Speech clear, generalized weakness HEENT: line clean Cardiovascular: S1S2 irregularly irregular tachycardic.120 Lungs:  coarse Abdomen:  Firm, mild tender, no guarding. No BS Musculoskeletal:  No acute deformities, muscle atrophy noted.  Skin:  No notable scars, rashes, cruises. No bed sores noted.  Lymphatics: no palpable lymphadenopathy of cervical, supraclvicular areas   LABS:  CBC  Recent Labs Lab 09/04/15 0430 09/05/15 0248 09/06/15 0232  WBC 15.5* 16.8* 18.7*  HGB 8.0* 7.7* 7.1*  HCT 25.8* 24.3* 23.0*  PLT 204 200 152   Coag's  Recent Labs Lab 09/04/15 0430 09/05/15 0248  INR 1.67* 1.52*    BMET  Recent Labs Lab 09/04/15 0430 09/05/15 0248 09/06/15 0232  NA 135 133* 131*  K 4.2 4.7 5.3*  CL 96* 96* 99*  CO2 28 24 20*  BUN 11 21* 28*  CREATININE 3.03* 4.41* 5.41*  GLUCOSE 127* 209* 147*   Electrolytes  Recent Labs Lab 09/02/15 0612  09/04/15 0430 09/05/15 0248 09/06/15 0232  CALCIUM 7.6*  < > 7.7* 8.1* 7.5*  PHOS 2.2*  --   --   --   --   < > = values in this interval not displayed. Sepsis Markers No results for input(s): LATICACIDVEN, PROCALCITON, O2SATVEN in the last 168 hours. ABG  Recent Labs Lab 09/05/15 1605  PHART 7.518*  PCO2ART 26.1*  PO2ART 70.5*   Liver Enzymes  Recent Labs Lab 09/02/15 0612  ALBUMIN 1.5*   Cardiac Enzymes  Recent Labs Lab 09/05/15 2110  TROPONINI 0.09*   Glucose  Recent Labs Lab 09/05/15 2015 09/06/15 0724 09/06/15 1127 09/06/15 1552 09/06/15 1959 09/07/15 0744  GLUCAP 121* 210* 197* 199* 224* 268*    Imaging Ct Abdomen Pelvis Wo Contrast  09/06/2015  ADDENDUM REPORT: 09/06/2015 12:24 ADDENDUM: Critical Value/emergent results were called by telephone at the time of interpretation on 09/06/2015 at 12:21 pm to Dr. Kalman Shan, who verbally acknowledged these results. Please note that while the underlying initial etiology of the right colonic abnormality is unclear, the current appearance is worrisome for necrotic/nonviable colon. Electronically Signed   By: Charline Bills M.D.   On: 09/06/2015 12:24  09/06/2015  CLINICAL DATA:  Pleural effusion, tachypnea EXAM: CT CHEST, ABDOMEN AND PELVIS WITHOUT CONTRAST  TECHNIQUE: Multidetector CT imaging of the chest, abdomen and pelvis was performed following the standard protocol without IV contrast. COMPARISON:  09/03/2015 FINDINGS: CT CHEST FINDINGS Mediastinum/Nodes: Cardiomegaly.  No pericardial effusion. Coronary atherosclerosis. Postsurgical changes related to prior CABG. Atherosclerotic calcifications of the aortic arch. Right IJ venous catheter  terminating at the cavoatrial junction. No suspicious mediastinal or axillary lymphadenopathy. Lungs/Pleura: Right lower lobe opacity, likely atelectasis. Small right and trace left pleural effusions. Very mild perihilar edema.  No focal consolidation. No suspicious pulmonary nodules. No pneumothorax. Musculoskeletal: Mild degenerative changes of the thoracic spine. Median sternotomy. CT ABDOMEN PELVIS FINDINGS Hepatobiliary: Unenhanced liver is unremarkable. Gallbladder is unremarkable. No intrahepatic or extrahepatic ductal dilatation. Pancreas: Within normal limits. Spleen: Within normal limits. Adrenals/Urinary Tract: Adrenal glands are unremarkable. Bilateral renal cortical atrophy. No renal calculi or hydronephrosis. Distended bladder. Stomach/Bowel: Stomach is unremarkable. No evidence of bowel obstruction. Again noted is right-sided colonic wall thickening with pneumatosis (series 201/images 80 and 90). However, there has been interval progression of the perihepatic fluid collection (series 201/ 42, 47, and 58), now with scattered foci of gas (series 201/ images 59, 61, and 71-72). This appearance suggests macroscopic (localized) perforation. 7.6 x 2.9 cm fluid/gas collection along the medial aspect of the inferior right hepatic lobe (series 201/ image 68). 5.2 x 8.9 cm fluid and gas collection along the medial aspect of the caudate (series 201/ image 57). Vascular/Lymphatic: Atherosclerotic calcifications of the abdominal aorta and branch vessels. Retroperitoneal fluid/stranding. No suspicious abdominopelvic lymphadenopathy. Reproductive: Calcified uterine fibroids. Bilateral ovaries are within normal limits. Other: Upper abdominal fluid/gas, as above. Musculoskeletal: Degenerative changes of the lumbar spine, including a prominent Schmorl's node deformity in the superior endplate of L2. IMPRESSION: Again noted is right-sided colonic wall thickening with pneumatosis. Progression of associated perihepatic  fluid collections/abscesses. Two new fluid/gas collections adjacent to the liver, measuring 7.6 x 2.9 cm and 5.2 x 8.9 cm, worrisome for localized macroscopic perforation. Surgical consultation is suggested. Small right and trace left pleural effusions. Electronically Signed: By: Charline BillsSriyesh  Krishnan M.D. On: 09/06/2015 12:17   Ct Head Wo Contrast  09/06/2015  CLINICAL DATA:  Encephalopathy, diabetes mellitus, hypertension, atrial fibrillation, end-stage renal disease, coronary artery disease post CABG EXAM: CT HEAD WITHOUT CONTRAST TECHNIQUE: Contiguous axial images were obtained from the base of the skull through the vertex without intravenous contrast. COMPARISON:  07-07-15 FINDINGS: Generalized atrophy. Normal ventricular morphology. No midline shift or mass effect. Old LEFT occipital infarct. Mild small vessel chronic ischemic changes of deep cerebral white matter. No intracranial hemorrhage, mass lesion, or evidence acute infarction. No extra-axial fluid collections. Atherosclerotic calcifications at vertebral and internal carotid arteries at skullbase. Bones and sinuses unremarkable. IMPRESSION: Atrophy with minimal small vessel chronic ischemic changes of deep cerebral white matter. Old LEFT occipital infarct. No acute intracranial abnormalities. Electronically Signed   By: Ulyses SouthwardMark  Boles M.D.   On: 09/06/2015 11:47   Ct Chest Wo Contrast  09/06/2015  ADDENDUM REPORT: 09/06/2015 12:24 ADDENDUM: Critical Value/emergent results were called by telephone at the time of interpretation on 09/06/2015 at 12:21 pm to Dr. Kalman ShanMurali Ramaswamy, who verbally acknowledged these results. Please note that while the underlying initial etiology of the right colonic abnormality is unclear, the current appearance is worrisome for necrotic/nonviable colon. Electronically Signed   By: Charline BillsSriyesh  Krishnan M.D.   On: 09/06/2015 12:24  09/06/2015  CLINICAL DATA:  Pleural effusion, tachypnea EXAM: CT CHEST, ABDOMEN AND PELVIS  WITHOUT CONTRAST TECHNIQUE: Multidetector CT imaging of the chest, abdomen and pelvis  was performed following the standard protocol without IV contrast. COMPARISON:  09/03/2015 FINDINGS: CT CHEST FINDINGS Mediastinum/Nodes: Cardiomegaly.  No pericardial effusion. Coronary atherosclerosis. Postsurgical changes related to prior CABG. Atherosclerotic calcifications of the aortic arch. Right IJ venous catheter terminating at the cavoatrial junction. No suspicious mediastinal or axillary lymphadenopathy. Lungs/Pleura: Right lower lobe opacity, likely atelectasis. Small right and trace left pleural effusions. Very mild perihilar edema.  No focal consolidation. No suspicious pulmonary nodules. No pneumothorax. Musculoskeletal: Mild degenerative changes of the thoracic spine. Median sternotomy. CT ABDOMEN PELVIS FINDINGS Hepatobiliary: Unenhanced liver is unremarkable. Gallbladder is unremarkable. No intrahepatic or extrahepatic ductal dilatation. Pancreas: Within normal limits. Spleen: Within normal limits. Adrenals/Urinary Tract: Adrenal glands are unremarkable. Bilateral renal cortical atrophy. No renal calculi or hydronephrosis. Distended bladder. Stomach/Bowel: Stomach is unremarkable. No evidence of bowel obstruction. Again noted is right-sided colonic wall thickening with pneumatosis (series 201/images 80 and 90). However, there has been interval progression of the perihepatic fluid collection (series 201/ 42, 47, and 58), now with scattered foci of gas (series 201/ images 59, 61, and 71-72). This appearance suggests macroscopic (localized) perforation. 7.6 x 2.9 cm fluid/gas collection along the medial aspect of the inferior right hepatic lobe (series 201/ image 68). 5.2 x 8.9 cm fluid and gas collection along the medial aspect of the caudate (series 201/ image 57). Vascular/Lymphatic: Atherosclerotic calcifications of the abdominal aorta and branch vessels. Retroperitoneal fluid/stranding. No suspicious  abdominopelvic lymphadenopathy. Reproductive: Calcified uterine fibroids. Bilateral ovaries are within normal limits. Other: Upper abdominal fluid/gas, as above. Musculoskeletal: Degenerative changes of the lumbar spine, including a prominent Schmorl's node deformity in the superior endplate of L2. IMPRESSION: Again noted is right-sided colonic wall thickening with pneumatosis. Progression of associated perihepatic fluid collections/abscesses. Two new fluid/gas collections adjacent to the liver, measuring 7.6 x 2.9 cm and 5.2 x 8.9 cm, worrisome for localized macroscopic perforation. Surgical consultation is suggested. Small right and trace left pleural effusions. Electronically Signed: By: Charline Bills M.D. On: 09/06/2015 12:17     ASSESSMENT / PLAN:  PULMONARY A:  Tachypnea -  unknown etiology, resolved.  Respiratory Alkalosis - ? Pain  Bilateral pleural effusion P:   Patient is a DO NOT INTUBATE. Pulmonary hygiene Oxygen as needed to supports sats > 92% NO ROLE TO ADVANCE O2, comfort care  CARDIOVASCULAR HD Cath Right IJ 10/8 >> A:  Hypotension on Midodrine for currently requiring IV fluid resuscitation boluses Atrial fibrillation with rapid ventricular rate - on an amiodarone drip.  History of coronary artery disease status post CABG P:  Levophed for MAP > 50 in setting ESRD No escalation No addition vaso Will need to discuss with family to dc all pressors once pressors off, no restart Cortisol 35, no role stress roids  RENAL A:   End-stage renal disease on hemodialysis - last HD on 10/20 Hyponatremia, mild P:   Not candidate CVVHD No role repeat labs  GASTROINTESTINAL A:   Diarrhea - CDiff negative S/P ABD pain / R colon pneumatosis consistent with ischemic colitis Protein caloric malnutrition P:   Control pain Liquid diet as tolerated  Patient and family want NO SURGERY   HEMATOLOGIC A:   Anemia of chronic disease  P:  scd Lovenox dc ensure not  on Deemed high risk for coumadin therapy   INFECTIOUS A:   Ischemic colitis on Zosyn abdo asbcess No heroics wished P:  Infectious work up   Mirant 10/23 >> UA 10/23 >> C-diff 10/19 >> negative  Zosyn, start date 09/02/2015>>>  Vanco, start date 10/22 >> off  Consider dc zosyn, will d/w fmaily   ENDOCRINE A:   Diabetes mellitus type 2  Hypothyroidism - on levothyroxine P:   SSI  Continue synthroid  NEUROLOGIC A:   Toxic metabolic encephalopathy secondary to hypotension, critical illness Comfort care P:   if distress add morphine drip Goal rr 12-18 No sig pain for now, prn ok for now  GLOBAL: Palliative care is on board. Patient is a DO NOT INTUBATE. Very concerned about her long term prognosis given her multiple organ failures with this hypotension of uncertain etiology.     FAMILY  - Updates: Daughter, Rosey Bath, updated 10/22 via phone per Dr. Ruta Hinds.  No family available 10/23 am.   - Inter-disciplinary family meet or Palliative Care meeting due by:  12/24   Ccm time 30 min  Mcarthur Rossetti. Tyson Alias, MD, FACP Pgr: [phone] [facility]

## 2015-09-07 NOTE — Progress Notes (Signed)
Spoke w dr Tyson Aliasfeinstein he feels pt may need hospice medical facility but not [facility]ac or snf. Will alert sw.

## 2015-09-07 NOTE — Progress Notes (Signed)
PT Cancellation and Discharge Note  Patient Details Name: Megan MaudlinMarjorie Caillier MRN: [id number] DOB: 03/29/1941   Cancelled Treatment:    Reason Eval/Treat Not Completed: Other (comment). RN reports patient has transitioned to comfort care and hospice. Patient no longer needs acute PT services. PT signing off. Please re-consult if needed in future.   Marcene BrawnChadwell, Jerrie Schussler Marie 09/07/2015, 1:05 PM  Lewis ShockAshly Hazleigh Mccleave, PT, DPT Pager #: [phone] Office #: [phone]

## 2015-09-07 NOTE — Care Management Important Message (Signed)
Important Message  Patient Details  Name: Megan Vasquez MRN: [id number] Date of Birth: 06/09/1941   Medicare Important Message Given:  Yes-fourth notification given    Orson AloeMegan P Cherly Erno 09/07/2015, 11:56 AM

## 2015-09-07 NOTE — Progress Notes (Signed)
   Subjective: No CP at present   Objective: Filed Vitals:   09/07/15 0700 09/07/15 0747 09/07/15 0800 09/07/15 1000  BP: 118/35 123/43 83/21 118/64  Pulse: 105 115 113   Temp:  98.8 F (37.1 C)    TempSrc:  Oral    Resp: 38 30 31 30   Height:      Weight:      SpO2: 99% 98% 97%    Weight change: 6 lb 6.3 oz (2.9 kg)  Intake/Output Summary (Last 24 hours) at 09/07/15 1049 Last data filed at 09/07/15 1000  Gross per 24 hour  Intake 1774.81 ml  Output      0 ml  Net 1774.81 ml    General: [place]ate[place]ck:  JVP is normal Heart: Irreg rate /rhythm   LungsRales at bases   Exemities:  No edema.    Tele;  Afib  100s to 130s Lab Results: Results for orders placed or performed during the hospital encounter of 2015-10-24 (from the past 24 hour(s))  Glucose, capillary     Status: Abnormal   Collection Time: 09/06/15 11:27 AM  Result Value Ref Range   Glucose-Capillary 197 (H) 65 - 99 mg/dL  Cortisol     Status: None   Collection Time: 09/06/15 11:34 AM  Result Value Ref Range   Cortisol, Plasma 35.9 ug/dL  Glucose, capillary     Status: Abnormal   Collection Time: 09/06/15  3:52 PM  Result Value Ref Range   Glucose-Capillary 199 (H) 65 - 99 mg/dL  Glucose, capillary     Status: Abnormal   Collection Time: 09/06/15  7:59 PM  Result Value Ref Range   Glucose-Capillary 224 (H) 65 - 99 mg/dL   Comment 1 Capillary Specimen   Glucose, capillary     Status: Abnormal   Collection Time: 09/07/15  7:44 AM  Result Value Ref Range   Glucose-Capillary 268 (H) 65 - 99 mg/dL    Studies/Results: No results found.  Medications: REviewed     @PROBHOSP @  1  Afib  Rates rapid  Currenlyt on IV amiodarone .  All exacerbated by underlying medical problems  Not assessment by [employer] [facility]instein.  Would keep on current regimen for now. If pull back on pressors would also pull back on amiodarone  Prognosis is poor.    LOS: 20 days   Megan Vasquez 09/07/2015, 10:49 AM

## 2015-09-07 NOTE — Progress Notes (Signed)
[facility] Progress Note   Subjective:  CT yesterday showed worsening fluid collections and new foci of gas.  Per radiologist interpretation the appearance suggested macroscopic localized perforation.  Also comments that appearance worrisome for necrotic/ nonviable colon.   B Ollis from CCM spoke with family by phone yesterday afternoon about the rather dire prognosis. Decision was made to cap Levophed, use prn morphine, continue ATB's, limit lab draws and further interventions She remains surprisingly free of symptoms (pain or SOB) despite her situation  Exam: BP 123/43 mmHg  Pulse 115  Temp(Src) 98.8 F (37.1 C) (Oral)  Resp 30  Ht 5\' 7"  (1.702 m)  Wt 71.8 kg (158 lb 4.6 oz)  BMI 24.79 kg/m2  SpO2 98%  General: Elderly frail appearing female in NAD (surprisingly) Remains pleasant and would like something off her food tray Facial/UE edema Heart: S1, S2, irregularly irregular Lungs: Bilateral breath decreased in bases. Tachypneic into the 30's Abdomen: Abdomen soft, dec'd BS Extremities: No LE edema. Heel protectors in place. Dialysis Access: L AVF + bruit  Dialysis Orders: TTS NW 4 hrs 65.5kgs 2K/2Ca L AVF 1000u hep hectorol 2 venofor 100 q HD until 10/8 then 50 q week Micera 100 q2 weeks - to start 10/6   Assessment: 1.Fevers / sepsis / ischemic colitis- on admission, got 7d abx, not a surgical candidate. Then became febrile again several days ago. Abx restarted. 10/22 deteriorated with tachypnea/ tachycardia w afib and moved to ICU.  Did not recommend CRRT given severe comorbidities. Decision made yesterday for no further escalation of care. Levo capped at 18. BP low 100's. No more labs.  2 ESRD usual TTS. Last HD was last Thursday. No further HD/RT given plan for no more invasive interventions.  3 Perm afib  4 hx CABG  5 Anemia Last Hb 7.1. Last darbe 10/18. 6 Disposition - no further escalation of care, invasive interventions (including  dialysis) No CRRT given not a surgical candidate for bowel perf/ischemia.   Camille Balynthia Oliviagrace Crisanti, MD [facility] [phone] Pager 09/07/2015, 9:24 AM     Recent Labs Lab 09/02/15 0612  09/04/15 0430 09/05/15 0248 09/06/15 0232  NA 134*  < > 135 133* 131*  K 4.0  < > 4.2 4.7 5.3*  CL 96*  < > 96* 96* 99*  CO2 26  < > 28 24 20*  GLUCOSE 136*  < > 127* 209* 147*  BUN 12  < > 11 21* 28*  CREATININE 3.06*  < > 3.03* 4.41* 5.41*  CALCIUM 7.6*  < > 7.7* 8.1* 7.5*  PHOS 2.2*  --   --   --   --   < > = values in this interval not displayed.  Recent Labs Lab 09/02/15 0612  ALBUMIN 1.5*    Recent Labs Lab 09/04/15 0430 09/05/15 0248 09/06/15 0232  WBC 15.5* 16.8* 18.7*  HGB 8.0* 7.7* 7.1*  HCT 25.8* 24.3* 23.0*  MCV 91.8 91.4 94.7  PLT 204 200 152   Medications . darbepoetin (ARANESP) injection - DIALYSIS  150 mcg Intravenous Q Tue-HD  . doxercalciferol  1 mcg Intravenous Q T,Th,Sa-HD  . insulin aspart  0-9 Units Subcutaneous TID WC  . levothyroxine  250 mcg Oral QAC breakfast  . multivitamin  1 tablet Oral QHS  . piperacillin-tazobactam (ZOSYN)  IV  2.25 g Intravenous 3 times per day  . pneumococcal 23 valent vaccine  0.5 mL Intramuscular Tomorrow-1000  . pravastatin  40 mg Oral q1800  . sodium chloride  3 mL Intravenous Q12H   . amiodarone 30 mg/hr (09/06/15 2007)  . norepinephrine (LEVOPHED) Adult infusion 18 mcg/min (09/07/15 0133)   sodium chloride, acetaminophen, acetaminophen, loperamide, metoprolol, morphine injection, ondansetron **OR** ondansetron (ZOFRAN) IV, sodium chloride, sodium chloride, sodium chloride

## 2015-09-08 MED ORDER — GLYCOPYRROLATE 0.2 MG/ML IJ SOLN: 0.1000 mg | INTRAMUSCULAR | Status: DC | PRN

## 2015-09-08 NOTE — Clinical Social Work Note (Signed)
CSW spoke with Palliative NP, at this time patient's discharge disposition remains unclear.  CSW to continue to follow and assist with discharge planning needs.  Megan Vasquez, [place] - [phone] Clinical Social Work Department Orthopedics [phone]([id number]) and Surgical [phone]([id number])

## 2015-09-08 NOTE — Progress Notes (Signed)
[facility] Note (N/C) Since pt comfort care/no further HD - have signed off Megan Balynthia Shweta Aman, MD [facility] [phone] Pager 09/08/2015, 1:10 PM

## 2015-09-08 NOTE — Progress Notes (Signed)
PULMONARY / CRITICAL CARE MEDICINE   Name: Megan Vasquez MRN: [id number] DOB: [birth year]    ADMISSION DATE:  08-19-15 CONSULTATION DATE:  09/05/2015  REFERRING MD :  Elvera Lennox  Reason for consultation:  Encephalopathy  BRIEF SUMMARY: 74 year old African-American female past medical history significant for diabetes, hypertension, hyperlipidemia, atrial fibrillation, end-stage renal disease, coronary artery disease who has been hospitalized since 08/19/15 for intra-abdominal issues. She has had a complicated hospitalization, PCCM re-consulted 10/22 pm for encephalopathy.   SUBJECTIVE: denies pain No dyspnea Off pressors  VITAL SIGNS: Temp:  [97.6 F (36.4 C)-98.7 F (37.1 C)] 97.6 F (36.4 C) (10/25 0520) Pulse Rate:  [115-138] 138 (10/24 2059) Resp:  [24-42] 24 (10/25 0520) BP: (76-138)/(48-96) 76/53 mmHg (10/25 0520) Weight:  [68.357 kg (150 lb 11.2 oz)] 68.357 kg (150 lb 11.2 oz) (10/24 2059)   HEMODYNAMICS:   VENTILATOR SETTINGS:   INTAKE / OUTPUT:  Intake/Output Summary (Last 24 hours) at 09/08/15 0829 Last data filed at 09/08/15 0600  Gross per 24 hour  Intake 802.71 ml  Output    300 ml  Net 502.71 ml    PHYSICAL EXAMINATION: General: chronically ill appearing female Neuro:  PERRL. EOMI. Speech clear, generalized weakness HEENT: line clean Cardiovascular: S1S2 irregularly irregular tachycardic.120 Lungs:  coarse Abdomen:  Firm, mild tender, no guarding. No BS Musculoskeletal:  No acute deformities, muscle atrophy noted.  Skin:  No notable scars, rashes, cruises. No bed sores noted.  Lymphatics: no palpable lymphadenopathy of cervical, supraclvicular areas   LABS:  CBC  Recent Labs Lab 09/04/15 0430 09/05/15 0248 09/06/15 0232  WBC 15.5* 16.8* 18.7*  HGB 8.0* 7.7* 7.1*  HCT 25.8* 24.3* 23.0*  PLT 204 200 152   Coag's  Recent Labs Lab 09/04/15 0430 09/05/15 0248  INR 1.67* 1.52*   BMET  Recent Labs Lab 09/04/15 0430  09/05/15 0248 09/06/15 0232  NA 135 133* 131*  K 4.2 4.7 5.3*  CL 96* 96* 99*  CO2 28 24 20*  BUN 11 21* 28*  CREATININE 3.03* 4.41* 5.41*  GLUCOSE 127* 209* 147*   Electrolytes  Recent Labs Lab 09/02/15 0612  09/04/15 0430 09/05/15 0248 09/06/15 0232  CALCIUM 7.6*  < > 7.7* 8.1* 7.5*  PHOS 2.2*  --   --   --   --   < > = values in this interval not displayed. Sepsis Markers No results for input(s): LATICACIDVEN, PROCALCITON, O2SATVEN in the last 168 hours. ABG  Recent Labs Lab 09/05/15 1605  PHART 7.518*  PCO2ART 26.1*  PO2ART 70.5*   Liver Enzymes  Recent Labs Lab 09/02/15 0612  ALBUMIN 1.5*   Cardiac Enzymes  Recent Labs Lab 09/05/15 2110  TROPONINI 0.09*   Glucose  Recent Labs Lab 09/06/15 0724 09/06/15 1127 09/06/15 1552 09/06/15 1959 09/07/15 0744 09/07/15 1233  GLUCAP 210* 197* 199* 224* 268* 283*    Imaging No results found.   ASSESSMENT / PLAN:  PULMONARY A:  Tachypnea -  unknown etiology, resolved.  Respiratory Alkalosis - ? Pain  Bilateral pleural effusion P:   Patient is a DO NOT INTUBATE. Pulmonary hygiene Oxygen as needed to supports sats > 92%   CARDIOVASCULAR HD Cath Right IJ 10/8 >> A:  Hypotension on Midodrine for currently requiring IV fluid resuscitation boluses Atrial fibrillation with rapid ventricular rate - on an amiodarone drip.  History of coronary artery disease status post CABG P:  No escalation   RENAL A:   End-stage renal disease on hemodialysis - last HD on 10/20  Hyponatremia, mild P:   Not candidate CVVHD No role repeat labs  GASTROINTESTINAL A:   Diarrhea - CDiff negative S/P ABD pain / R colon pneumatosis consistent with ischemic colitis Protein caloric malnutrition P:   Liquid diet as tolerated    HEMATOLOGIC A:   Anemia of chronic disease  P:  scd Lovenox dc  Deemed high risk for coumadin therapy   INFECTIOUS A:   Ischemic colitis on Zosyn abdo asbcess No heroics  wished P:  Infectious work up   MirantBCx2 10/23 >> ng C-diff 10/19 >> negative  Zosyn, start date 09/02/2015>>> off Vanco, start date 10/22 >> off   ENDOCRINE A:   Diabetes mellitus type 2  Hypothyroidism - on levothyroxine P:   SSI  Continue synthroid  NEUROLOGIC A:   Toxic metabolic encephalopathy secondary to hypotension, critical illness Comfort care P:   if distress add morphine drip No sig pain for now, prn ok for now  GLOBAL: Comfort care, tr to triad 10/26 am  FAMILY  - Updates: Daughter, Megan Vasquez, updated by palliative care  - Inter-disciplinary family meet or Palliative Care meeting due by: done  Cyril Mourningakesh Kenora Spayd MD. FCCP. [facility] Pager [phone] If no response call [phone]

## 2015-09-09 DIAGNOSIS — N186 End stage renal disease: Secondary | ICD-10-CM | POA: Insufficient documentation

## 2015-09-11 LAB — CULTURE, BLOOD (ROUTINE X 2)
Culture: NO GROWTH
Culture: NO GROWTH

## 2015-09-15 NOTE — Progress Notes (Signed)
Daily Progress Note   Patient Name: Megan Vasquez       Date: 2015/09/11 DOB: 11/25/1940  Age: 74 y.o. MRN#: [id number] Attending Physician: Orson Eva, MD Primary Care Physician: Maximino Greenland, MD Admit Date: 08/25/2015  Reason for Consultation/Follow-up: GOC  Subjective: I met today with [name] daughters [name] and [name] and [name]'s partner [name] and her grandson [name]. They tell me that [name] has been awake and talking with them this morning but has recently received pain medication and is now sleeping. They say that she commented that every time she wakes up she is in a different room (for this reason they do not want to move her to hospice today but are open to this option). She was asking them if she was dying and they said they could not being themselves to tell her yes but told her that her belly is worse and things are not good. They said she did not seem anxious or scared when talking about this and only responds "well." It does sound as if [name] is aware that she is dying and seems to be more accepting of this then previously by what the family describes as her reactions. Emotional support given to family. They understand that she can die anytime but that it may also be days. Goal is comfort care - we will d/c central line. All questions/concerns addressed.   Interval Events: 10/23: Worsening CT abd with likely perforation, DNR 10/24 Full comfort care  Length of Stay: 22 days  Current Medications: Scheduled Meds:  . levothyroxine  250 mcg Oral QAC breakfast  . sodium chloride  3 mL Intravenous Q12H    Continuous Infusions:    PRN Meds: acetaminophen, acetaminophen, fentaNYL (SUBLIMAZE) injection, glycopyrrolate, loperamide, ondansetron **OR**  ondansetron (ZOFRAN) IV, sodium chloride, sodium chloride  Physical Exam: Physical Exam  Constitutional: She appears lethargic. No distress.  HENT:  Head: Normocephalic and atraumatic.  Cardiovascular: Tachycardia present.   Pulmonary/Chest: Effort normal. No accessory muscle usage. No respiratory distress.  Neurological: She appears lethargic.  Sleeping, did not assess/awaken  Psychiatric:  Flat affect                Vital Signs: BP 85/53 mmHg  Pulse 102  Temp(Src) 97.5 F (36.4 C) (Oral)  Resp 27  Ht _0  (1.702  m)  Wt 68.357 kg (150 lb 11.2 oz)  BMI 23.60 kg/m2  SpO2 83% SpO2: SpO2: (!) 83 % O2 Device: O2 Device: Nasal Cannula O2 Flow Rate: O2 Flow Rate (L/min): 2 L/min  Intake/output summary:   Intake/Output Summary (Last 24 hours) at 2015/10/06 0956 Last data filed at October 06, 2015 0700  Gross per 24 hour  Intake    245 ml  Output    125 ml  Net    120 ml   Baseline Weight: Weight: 64.3 kg (141 lb 12.1 oz) Most recent weight: Weight: 68.357 kg (150 lb 11.2 oz)       Palliative Assessment/Data: Flowsheet Rows        Most Recent Value   Intake Tab    Referral Department  Hospitalist   Unit at Time of Referral  Cardiac/Telemetry Unit   Palliative Care Primary Diagnosis  Cardiac   Date Notified  08/27/15   Palliative Care Type  New Palliative care   Reason for referral  Clarify Goals of Care   Date of Admission  09/10/2015   Date first seen by Palliative Care  08/29/15   # of days Palliative referral response time  2 Day(s)   # of days IP prior to Palliative referral  9   Clinical Assessment    Psychosocial & Spiritual Assessment    Palliative Care Outcomes       Additional Data Reviewed: No results for input(s): WBC, HGB, PLT, NA, BUN, CREATININE in the last 72 hours.  Invalid input(s): ALB   Problem List:  Patient Active Problem List   Diagnosis Date Noted  . Abdominal pain   . Shock circulatory ([place]) 09/06/2015  . Acute encephalopathy   . Pressure  ulcer 09/03/2015  . Palliative care encounter   . Hypotension   . Ischemia, bowel ([place])   . Pneumatosis coli   . Acute respiratory failure with hypoxia ([place])   . Hemodialysis catheter dysfunction ([facility])   . Sepsis ([facility]) 08/21/2015  . Fever, unspecified 08/21/2015  . Leukocytosis 08/21/2015  . PAD (peripheral artery disease) ([facility]) 08/21/2015  . Right foot pain 08/21/2015  . RLQ abdominal pain 09/14/2015  . Thrombocytopenia ([facility]) 09/03/2015  . Anemia, chronic renal failure 09/13/2015  . Hypothyroidism 09/05/2015  . Metabolic encephalopathy 26/71/2458  . Severe mitral regurgitation 10/15/2014  . Coronary artery disease 10/18/2013  . Peripheral arterial disease ([facility]) 10/18/2013  . Diabetes mellitus, insulin dependent (IDDM), controlled ([place]) 06/10/2013  . HTN (hypertension) 06/10/2013  . CKD (chronic kidney disease) stage V requiring chronic dialysis ([place]) 06/10/2013  . HLD (hyperlipidemia) 06/10/2013  . Atrial fibrillation with RVR ([place]) 02/01/2013  . Long term current use of anticoagulant therapy 02/01/2013     Palliative Care Assessment & Plan    1.Code Status:  DNR    Code Status Orders        Start     Ordered   09/06/15 1727  Do not attempt resuscitation (DNR)   Continuous    Question Answer Comment  In the event of cardiac or respiratory ARREST Do not call a "code blue"   In the event of cardiac or respiratory ARREST Do not perform Intubation, CPR, defibrillation or ACLS   In the event of cardiac or respiratory ARREST Use medication by any route, position, wound care, and other measures to relive pain and suffering. May use oxygen, suction and manual treatment of airway obstruction as needed for comfort.      09/06/15 1727  2. Goals of Care:  Focus is on comfort and not escalating care.   Desire for further Chaplaincy support: no  Psycho-social Needs: Family struggling with EOL for [name] and need much support. Will benefit from hospice  bereavement support.   3. Symptom Management:      1. Pain/dyspnea: Fentanyl 25-50 mcg every 2 hours prn.       2. Robinul prn for secretions.   4. Palliative Prophylaxis:   Frequent Pain Assessment  5. Prognosis: Hours - Days  6. Discharge Planning:  Anticipate hospital death vs hospice facility.    Thank you for allowing the Palliative Medicine Team to assist in the care of this patient.   Time In/Out: 1220-1300 Total Time 24mn Prolonged Time Billed  yes        APershing Proud NP  129/24/4628 9:56 AM  Please contact Palliative Medicine Team phone at [phone] questions and concerns.

## 2015-09-15 NOTE — Progress Notes (Signed)
Nurse aid went to check pt vital signs but did not do the check as pt seemed not to be breathing and she did not have a pulse. No pulse or heart sounds felt and heard on assessment at 1515hrs. Second nurse verified at 1520hrs. Pt confirmed deceased

## 2015-09-15 NOTE — Progress Notes (Addendum)
Daily Progress Note   Patient Name: Megan MaudlinMarjorie Vasquez       Date: 08/25/2015 DOB: 11/30/1940  Age: 74 y.o. MRN#: [id number] Attending Physician: Catarina Hartshornavid Tat, MD Primary Care Physician: Gwynneth AlimentSANDERS,ROBYN N, MD Admit Date: 08/21/2015  Reason for Consultation/Follow-up: GOC  Subjective: Ms. Megan Vasquez is awake when I came to see her today but not very talkative. Family is at bedside. She does answer yes/no most of the time but not consistently. She does say "I just want to be alone." She has no questions for me. I spoke with her family right outside the room and Megan Vasquez is struggling and questioning decisions for comfort. Other family members are reassuring her and I reassure her that there were not really any other options that would change the outcome. Megan Vasquez understands but is struggling.   Late entry: I received a call later from family. When I come Megan Vasquez has died. I stayed to provide emotional support to family.   Interval Events: 10/23: Worsening CT abd with likely perforation, DNR 10/24 Full comfort care  Length of Stay: 22 days  Current Medications: Scheduled Meds:  . levothyroxine  250 mcg Oral QAC breakfast  . sodium chloride  3 mL Intravenous Q12H    Continuous Infusions:    PRN Meds: acetaminophen, acetaminophen, fentaNYL (SUBLIMAZE) injection, glycopyrrolate, loperamide, ondansetron **OR** ondansetron (ZOFRAN) IV, sodium chloride, sodium chloride  Physical Exam: Physical Exam  Constitutional: She appears lethargic. No distress.  HENT:  Head: Normocephalic and atraumatic.  Cardiovascular: Tachycardia present.   Pulmonary/Chest: No accessory muscle usage. Tachypnea noted. She is in respiratory distress.  Neurological: She appears lethargic.  Psychiatric:  Flat  affect                Vital Signs: BP 85/53 mmHg  Pulse 102  Temp(Src) 97.5 F (36.4 C) (Oral)  Resp 27  Ht 5\' 7"  (1.702 m)  Wt 68.357 kg (150 lb 11.2 oz)  BMI 23.60 kg/m2  SpO2 83% SpO2: SpO2: (!) 83 % O2 Device: O2 Device: Nasal Cannula O2 Flow Rate: O2 Flow Rate (L/min): 2 L/min  Intake/output summary:   Intake/Output Summary (Last 24 hours) at 08/17/2015 1405 Last data filed at 09/13/2015 0700  Gross per 24 hour  Intake    245 ml  Output    125 ml  Net  120 ml   Baseline Weight: Weight: 64.3 kg (141 lb 12.1 oz) Most recent weight: Weight: 68.357 kg (150 lb 11.2 oz)       Palliative Assessment/Data: Flowsheet Rows        Most Recent Value   Intake Tab    Referral Department  Hospitalist   Unit at Time of Referral  Cardiac/Telemetry Unit   Palliative Care Primary Diagnosis  Cardiac   Date Notified  08/27/15   Palliative Care Type  New Palliative care   Reason for referral  Clarify Goals of Care   Date of Admission  08-23-2015   Date first seen by Palliative Care  08/29/15   # of days Palliative referral response time  2 Day(s)   # of days IP prior to Palliative referral  9   Clinical Assessment    Psychosocial & Spiritual Assessment    Palliative Care Outcomes       Additional Data Reviewed: No results for input(s): WBC, HGB, PLT, NA, BUN, CREATININE in the last 72 hours.  Invalid input(s): ALB   Problem List:  Patient Active Problem List   Diagnosis Date Noted  . Abdominal pain   . Shock circulatory ([facility]) 09/06/2015  . Acute encephalopathy   . Pressure ulcer 09/03/2015  . Palliative care encounter   . Hypotension   . Ischemia, bowel ([facility])   . Pneumatosis coli   . Acute respiratory failure with hypoxia ([facility])   . Hemodialysis catheter dysfunction ([facility])   . Sepsis ([facility]) 08/21/2015  . Fever, unspecified 08/21/2015  . Leukocytosis 08/21/2015  . PAD (peripheral artery disease) ([facility]) 08/21/2015  . Right foot pain 08/21/2015  . RLQ abdominal pain  08/23/2015  . Thrombocytopenia ([facility]) 23-Aug-2015  . Anemia, chronic renal failure 23-Aug-2015  . Hypothyroidism 2015/08/23  . Metabolic encephalopathy 08-23-15  . Severe mitral regurgitation 10/15/2014  . Coronary artery disease 10/18/2013  . Peripheral arterial disease ([facility]) 10/18/2013  . Diabetes mellitus, insulin dependent (IDDM), controlled ([facility]) 06/10/2013  . HTN (hypertension) 06/10/2013  . CKD (chronic kidney disease) stage V requiring chronic dialysis ([facility]) 06/10/2013  . HLD (hyperlipidemia) 06/10/2013  . Atrial fibrillation with RVR ([facility]) 02/01/2013  . Long term current use of anticoagulant therapy 02/01/2013     Palliative Care Assessment & Plan    1.Code Status:  DNR    Code Status Orders        Start     Ordered   09/06/15 1727  Do not attempt resuscitation (DNR)   Continuous    Question Answer Comment  In the event of cardiac or respiratory ARREST Do not call a "code blue"   In the event of cardiac or respiratory ARREST Do not perform Intubation, CPR, defibrillation or ACLS   In the event of cardiac or respiratory ARREST Use medication by any route, position, wound care, and other measures to relive pain and suffering. May use oxygen, suction and manual treatment of airway obstruction as needed for comfort.      09/06/15 1727       2. Goals of Care:  Focus is on comfort and not escalating care.   Desire for further Chaplaincy support: no  Psycho-social Needs: Family struggling with EOL for Ms. Sorenson and need much support. Will benefit from hospice bereavement support.   3. Symptom Management:      1. Pain/dyspnea: Fentanyl 25-50 mcg every 2 hours prn.       2. Robinul prn for secretions.   4. Palliative Prophylaxis:   Frequent  Pain Assessment  5. Prognosis: Hours - Days  6. Discharge Planning:  Anticipate hospital death vs hospice facility.    Thank you for allowing the Palliative Medicine Team to assist in the care of this  patient.   Time In/Out: 1310-1400, 1520-1540 Total Time Prolonged Time Billed  yes        Ulice Bold, NP  08/25/2015, 2:05 PM  Please contact Palliative Medicine Team phone at [phone] for questions and concerns.

## 2015-09-15 NOTE — Progress Notes (Signed)
Pt's body transported to morgue around 1750hrs

## 2015-09-15 NOTE — Progress Notes (Signed)
Pt 's family member came and reported to staff that she thought pt was dying. Went to check on pt and found her having labored breathing with shallow and slow respirations. Notified family member that we will check pt vital signs and monitor condition

## 2015-09-15 NOTE — Discharge Planning (Signed)
[facility] do not [facility] yet. Asked Nikki Domheresa Perkins (daughter) to call 6n when they have information.

## 2015-09-15 NOTE — Progress Notes (Signed)
Pt confirmed deceased by MD. Family at bedside at time of death

## 2015-09-15 NOTE — Progress Notes (Signed)
 iv fentanyl administered for c/o pain

## 2015-09-15 NOTE — Discharge Summary (Signed)
Death Summary  Megan Vasquez ZOX:[id number] DOB: 1940-12-14 DOA: 08-19-15  PCP: [name], MD  Admit date: 19-Aug-2015 Date of Death: 2015/09/10  Final Diagnoses:  Sepsis -Secondary to ischemic colitis -Initial CT abdomen and pelvis showed pneumatosis along the cecum and ascending colon with small amount of complex ascites -Patient was initially seen by general surgery, but did not feel that the patient was a good operative candidate due to her multiple severe medical comorbidities -The patient was started on intravenous antibiotics the patient's leukocytosis continued to worsen - on 08/22/2015, the patient developed hypotension and mental status change. The patient was transferred to the intensive care unit. - repeat CT abdomen and pelvis was performed on 08/25/2015 which revealed pneumatosis and colonic wall thickening  -Gen. surgery did not feel that there was an acute indication for surgical intervention  -The patient was continued on antibiotics and stress steroids  -Unfortunately, the patient continued to have fevers  -She developed atrial fibrillation with RVR  -Cardiology was consulted and her cardiac medications were adjusted -Nephrology was consulted initially and they continue to manage the patient's electrolytes and dialysis needs- -The patient subsequently stabilized and was transferred out of the ICU. -Unfortunately, the patient became increasingly confused and less responsive  -The patient became hypotensive once again with atrial fibrillation with RVR.  -The patient was transferred back to the intensive care unit and started on Levophed -Repeat CT abdomen and pelvis 09/06/2015 continued to show right-sided colonic wall thickening with pneumatosis with progression of her perihepatic fluid collections and abscesses with 2 new air-fluid collections.  -Palliative medicine was consulted and had discussions with the patient's family  -The patient continued to decline  clinically despite maximal aggressive medical therapy  -Because of the patient's continued clinical decline, she was deemed not to be a good Dialysis candidate by nephrology -Although the patient continued to struggle with goals of care, the patient was ultimately made and are with comfort  -She was transferred to the regular medical floor and medications of curative intent were discontinued  -Ultimately, the patient expired on September 10, 2015 at 1510   Permanent atrial fibrillation  -she was followed by cardiology  ESRD -Patient was followed by nephrology for dialysis needs CAD with hx of CABG -no chest pain  Diabetes mellitus type 2 -Hemoglobin A1c 7.2 -No longer active issue as the patient was transitioned to comfort care  Anemia of CKD   The results of significant diagnostics from this hospitalization (including imaging, microbiology, ancillary and laboratory) are listed below for reference.    Significant Diagnostic Studies: Ct Abdomen Pelvis Wo Contrast  09/06/2015  ADDENDUM REPORT: 09/06/2015 12:24 ADDENDUM: Critical Value/emergent results were called by telephone at the time of interpretation on 09/06/2015 at 12:21 pm to Dr. Kalman Shan, who verbally acknowledged these results. Please note that while the underlying initial etiology of the right colonic abnormality is unclear, the current appearance is worrisome for necrotic/nonviable colon. Electronically Signed   By: Charline Bills M.D.   On: 09/06/2015 12:24  09/06/2015  CLINICAL DATA:  Pleural effusion, tachypnea EXAM: CT CHEST, ABDOMEN AND PELVIS WITHOUT CONTRAST TECHNIQUE: Multidetector CT imaging of the chest, abdomen and pelvis was performed following the standard protocol without IV contrast. COMPARISON:  09/03/2015 FINDINGS: CT CHEST FINDINGS Mediastinum/Nodes: Cardiomegaly.  No pericardial effusion. Coronary atherosclerosis. Postsurgical changes related to prior CABG. Atherosclerotic calcifications of the aortic arch.  Right IJ venous catheter terminating at the cavoatrial junction. No suspicious mediastinal or axillary lymphadenopathy. Lungs/Pleura: Right lower lobe opacity, likely atelectasis. Small  right and trace left pleural effusions. Very mild perihilar edema.  No focal consolidation. No suspicious pulmonary nodules. No pneumothorax. Musculoskeletal: Mild degenerative changes of the thoracic spine. Median sternotomy. CT ABDOMEN PELVIS FINDINGS Hepatobiliary: Unenhanced liver is unremarkable. Gallbladder is unremarkable. No intrahepatic or extrahepatic ductal dilatation. Pancreas: Within normal limits. Spleen: Within normal limits. Adrenals/Urinary Tract: Adrenal glands are unremarkable. Bilateral renal cortical atrophy. No renal calculi or hydronephrosis. Distended bladder. Stomach/Bowel: Stomach is unremarkable. No evidence of bowel obstruction. Again noted is right-sided colonic wall thickening with pneumatosis (series 201/images 80 and 90). However, there has been interval progression of the perihepatic fluid collection (series 201/ 42, 47, and 58), now with scattered foci of gas (series 201/ images 59, 61, and 71-72). This appearance suggests macroscopic (localized) perforation. 7.6 x 2.9 cm fluid/gas collection along the medial aspect of the inferior right hepatic lobe (series 201/ image 68). 5.2 x 8.9 cm fluid and gas collection along the medial aspect of the caudate (series 201/ image 57). Vascular/Lymphatic: Atherosclerotic calcifications of the abdominal aorta and branch vessels. Retroperitoneal fluid/stranding. No suspicious abdominopelvic lymphadenopathy. Reproductive: Calcified uterine fibroids. Bilateral ovaries are within normal limits. Other: Upper abdominal fluid/gas, as above. Musculoskeletal: Degenerative changes of the lumbar spine, including a prominent Schmorl's node deformity in the superior endplate of L2. IMPRESSION: Again noted is right-sided colonic wall thickening with pneumatosis. Progression of  associated perihepatic fluid collections/abscesses. Two new fluid/gas collections adjacent to the liver, measuring 7.6 x 2.9 cm and 5.2 x 8.9 cm, worrisome for localized macroscopic perforation. Surgical consultation is suggested. Small right and trace left pleural effusions. Electronically Signed: By: Charline Bills M.D. On: 09/06/2015 12:17   Ct Abdomen Pelvis Wo Contrast  09/03/2015  CLINICAL DATA:  Followup abnormal ascending colon. EXAM: CT ABDOMEN AND PELVIS WITHOUT CONTRAST TECHNIQUE: Multidetector CT imaging of the abdomen and pelvis was performed following the standard protocol without IV contrast. COMPARISON:  CT scans 10/05 and 08/24/2015 FINDINGS: Lower chest: There are persistent small bilateral pleural effusions, right greater than left with overlying atelectasis. The heart is enlarged but stable. No pericardial effusion. Dense 3 vessel coronary artery calcifications are noted along with dense aortic calcifications. Hepatobiliary: No focal hepatic lesions. Stable prominent intrahepatic vasculature with scattered calcifications. The gallbladder is slightly distended with some mild surrounding inflammatory change. No common bile duct dilatation. Pancreas: No mass, inflammation or duct dilatation. Moderate atrophy. Spleen: No mass.  Normal size. Adrenals/Urinary Tract: The adrenal glands are normal and stable. Both kidneys are small and demonstrate renal artery calcifications. No worrisome mass or hydronephrosis. Stomach/Bowel: The stomach, duodenum, small bowel and colon appear stable. No findings for small bowel inflammation or obstruction. The terminal ileum is normal. Persistent abnormal appearance of the cecum. There is fairly significant cecal wall thickening with some component of pneumatosis. This also involves the ascending colon. It may reflect some type of chronic colitis. Ischemic colitis is unlikely given the little change since 08/19/2015. Vascular/Lymphatic: Advanced atherosclerotic  calcifications involving the aorta and branch vessels, most notably the renal arteries. No obvious mesenteric or retroperitoneal mass or adenopathy. There is diffuse mesenteric edema and a small amount of abdominal/ pelvic fluid. Anasarca is also noted. Other: Stable enlarged fibroid uterus with calcified fibroids. The bladder is moderately distended. No pelvic mass or adenopathy. No inguinal mass or adenopathy. Musculoskeletal: No significant bony findings. Stable remote L2 fracture and advanced degenerative changes involving the spine IMPRESSION: 1. Persistent small bilateral pleural effusions, right greater than left with overlying atelectasis. 2. Cardiac enlargement and  dense 3 vessel coronary artery calcifications. 3. Persistent diffuse wall thickening involving the cecum and ascending colon with patchy pneumatosis coli. This could be chronic colitis. Ischemia is unlikely given the time course of stability. No perforation or abscess. No obstruction. 4. Enlarged fibroid uterus. 5. Mild distention of the bladder. 6. Mesenteric edema, small amount of abdominal and free pelvic fluid and diffuse body wall edema. Electronically Signed   By: Rudie Meyer M.D.   On: 09/03/2015 14:13   Ct Abdomen Pelvis Wo Contrast  08/24/2015  CLINICAL DATA:  Pneumatosis along cecum and ascending colon. EXAM: CT ABDOMEN AND PELVIS WITHOUT CONTRAST TECHNIQUE: Multidetector CT imaging of the abdomen and pelvis was performed following the standard protocol without IV contrast. COMPARISON:  CT 08/19/2015 FINDINGS: Small right pleural effusion and trace left pleural effusion. Compressive atelectasis in the right lower lobe. There is cardiomegaly. Prior CABG. Effusions have increased since prior study. Liver, gallbladder, spleen, pancreas, adrenals have an unremarkable unenhanced appearance. Kidneys are atrophic. Abnormal cecum and ascending colon again noted. There is wall thickening with pneumatosis noted. Surrounding inflammatory  stranding. There is free fluid adjacent to the liver and in the cul-de-sac of the pelvis. Overall appearance is similar to prior CT. No evidence of bowel obstruction. Calcified fibroids within the uterus. Urinary bladder is grossly unremarkable. Aorta is heavily calcified, non aneurysmal. IMPRESSION: Continued abnormal appearance of the cecum and days ascending colon with wall thickening and pneumatosis. Surrounding inflammatory change and free fluid in the abdomen and pelvis. Findings are concerning for possible bowel ischemia. Small bilateral pleural effusions, right larger than left, increased since prior study. Right lower lobe atelectasis. Cardiomegaly. Atrophic kidneys. Calcified uterine fibroids. Electronically Signed   By: Charlett Nose M.D.   On: 08/24/2015 13:31   Ct Abdomen Pelvis Wo Contrast  08/19/2015  CLINICAL DATA:  Acute onset of right lower quadrant abdominal pain for 1 day. Initial encounter. EXAM: CT ABDOMEN AND PELVIS WITHOUT CONTRAST TECHNIQUE: Multidetector CT imaging of the abdomen and pelvis was performed following the standard protocol without IV contrast. COMPARISON:  Abdominal radiograph performed 01/22/2006 FINDINGS: Trace bilateral pleural effusions are noted, with bibasilar atelectasis and scarring. Cardiomegaly is noted. Diffuse coronary artery calcifications are seen. The patient is status post median sternotomy. A small amount of slightly complex ascites is seen in the abdomen and pelvis, predominantly on the right side. The liver and spleen are unremarkable in appearance. Scattered vascular calcifications are seen within the liver. The gallbladder is within normal limits. The pancreas and adrenal glands are unremarkable. Bilateral renal atrophy is noted. Nonspecific stranding and fluid are noted about both kidneys. There is no evidence of hydronephrosis. No renal or ureteral stones are seen. The small bowel is unremarkable in appearance. The stomach is within normal limits. No  acute vascular abnormalities are seen. Scattered calcification is noted along the abdominal aorta and its branches, including along the renal arteries and superior mesenteric artery. The appendix is not definitely seen. There is no evidence for appendicitis. There appears to be pneumatosis along the cecum and ascending colon, of uncertain significance, with mild associated wall thickening and soft tissue inflammation. The remainder of the colon is grossly unremarkable. The bladder is mildly distended and grossly unremarkable in appearance. Several calcified uterine fibroids are seen. The uterus is otherwise grossly unremarkable. The ovaries are relatively symmetric. No suspicious adnexal masses are seen. No inguinal lymphadenopathy is seen. No acute osseous abnormalities are identified. Mild degenerative change is noted along the lumbar spine. There is chronic  loss of height involving vertebral body L2. IMPRESSION: 1. Apparent pneumatosis along the cecum and ascending colon at the right lower quadrant, of uncertain significance, with mild associated wall thickening and soft tissue inflammation. Though this could be benign, early changes of necrotizing enterocolitis cannot be excluded. Would correlate with the patient's symptoms. 2. Small amount of slightly complex ascites in the abdomen and pelvis, predominantly on the right. 3. Trace bilateral pleural effusions, with bibasilar atelectasis and scarring. 4. Cardiomegaly.  Diffuse coronary artery calcifications seen. 5. Bilateral renal atrophy noted. 6. Scattered calcification along the abdominal aorta and its branches, including along the renal arteries and superior mesenteric artery. 7. Calcified uterine fibroids seen. These results were called by telephone at the time of interpretation on 08/19/2015 at 3:07 am to [facility] on [id number], who verbally acknowledged these results. Electronically Signed   By: Roanna Raider M.D.   On: 08/19/2015 03:09   Dg Chest 1  View  08/21/2015  CLINICAL DATA:  Shortness of Breath.  Chronic renal failure EXAM: CHEST 1 VIEW COMPARISON:  August 18, 2015 FINDINGS: There is patchy airspace consolidation in both lower lobes, new on the right and increased on the left. There are small pleural effusions bilaterally. There is cardiomegaly with pulmonary venous hypertension. No interstitial edema is appreciable. Patient is status post coronary artery bypass grafting. No adenopathy. Bones appear somewhat sclerotic, consistent with chronic renal failure. IMPRESSION: Evidence of a degree of congestive heart failure. Suspect superimposed pneumonia in the bases, particularly on the left. Bone show evidence of chronic renal failure. Electronically Signed   By: Bretta Bang III M.D.   On: 08/21/2015 10:33   Dg Abd 1 View  08/21/2015  CLINICAL DATA:  Weakness and pain.  Chronic renal failure. EXAM: ABDOMEN - 1 VIEW COMPARISON:  CT abdomen and pelvis August 19, 2015 FINDINGS: There are multiple loops of mildly dilated small bowel. There is complex air in the at ascending colon with questionable pneumatosis in this area. Other areas of pneumatosis. No pneumoperitoneum is appreciable on this supine examination. There is contrast in the left colon and rectum. There are sigmoid and descending colonic diverticula noted. There are multiple foci of vascular calcification. IMPRESSION: Mild small bowel dilatation. Question ileus versus enteritis. Obstruction is not felt to be likely. Note that contrast reaches the rectum. Evidence suggesting pneumatosis in the right colon. Etiology uncertain. Ischemic change in this area cannot be excluded. Note that this finding was also present on recent CT examination. Electronically Signed   By: Bretta Bang III M.D.   On: 08/21/2015 10:35   Ct Head Wo Contrast  09/06/2015  CLINICAL DATA:  Encephalopathy, diabetes mellitus, hypertension, atrial fibrillation, end-stage renal disease, coronary artery disease post  CABG EXAM: CT HEAD WITHOUT CONTRAST TECHNIQUE: Contiguous axial images were obtained from the base of the skull through the vertex without intravenous contrast. COMPARISON:  09/01/2015 FINDINGS: Generalized atrophy. Normal ventricular morphology. No midline shift or mass effect. Old LEFT occipital infarct. Mild small vessel chronic ischemic changes of deep cerebral white matter. No intracranial hemorrhage, mass lesion, or evidence acute infarction. No extra-axial fluid collections. Atherosclerotic calcifications at vertebral and internal carotid arteries at skullbase. Bones and sinuses unremarkable. IMPRESSION: Atrophy with minimal small vessel chronic ischemic changes of deep cerebral white matter. Old LEFT occipital infarct. No acute intracranial abnormalities. Electronically Signed   By: Ulyses Southward M.D.   On: 09/06/2015 11:47   Ct Head Wo Contrast  08/20/2015  CLINICAL DATA:  Altered mental status, generalized  weakness for 2 days. EXAM: CT HEAD WITHOUT CONTRAST TECHNIQUE: Contiguous axial images were obtained from the base of the skull through the vertex without intravenous contrast. COMPARISON:  01/24/2006 FINDINGS: Old left occipital infarct. No acute intracranial abnormality. Specifically, no hemorrhage, hydrocephalus, mass lesion, acute infarction, or significant intracranial injury. No acute calvarial abnormality. Visualized paranasal sinuses and mastoids clear. Orbital soft tissues unremarkable. IMPRESSION: No acute intracranial abnormality. Old left occipital infarct. Electronically Signed   By: Charlett Nose M.D.   On: 09/15/2015 13:00   Ct Chest Wo Contrast  09/06/2015  ADDENDUM REPORT: 09/06/2015 12:24 ADDENDUM: Critical Value/emergent results were called by telephone at the time of interpretation on 09/06/2015 at 12:21 pm to Dr. Kalman Shan, who verbally acknowledged these results. Please note that while the underlying initial etiology of the right colonic abnormality is unclear, the  current appearance is worrisome for necrotic/nonviable colon. Electronically Signed   By: Charline Bills M.D.   On: 09/06/2015 12:24  09/06/2015  CLINICAL DATA:  Pleural effusion, tachypnea EXAM: CT CHEST, ABDOMEN AND PELVIS WITHOUT CONTRAST TECHNIQUE: Multidetector CT imaging of the chest, abdomen and pelvis was performed following the standard protocol without IV contrast. COMPARISON:  09/03/2015 FINDINGS: CT CHEST FINDINGS Mediastinum/Nodes: Cardiomegaly.  No pericardial effusion. Coronary atherosclerosis. Postsurgical changes related to prior CABG. Atherosclerotic calcifications of the aortic arch. Right IJ venous catheter terminating at the cavoatrial junction. No suspicious mediastinal or axillary lymphadenopathy. Lungs/Pleura: Right lower lobe opacity, likely atelectasis. Small right and trace left pleural effusions. Very mild perihilar edema.  No focal consolidation. No suspicious pulmonary nodules. No pneumothorax. Musculoskeletal: Mild degenerative changes of the thoracic spine. Median sternotomy. CT ABDOMEN PELVIS FINDINGS Hepatobiliary: Unenhanced liver is unremarkable. Gallbladder is unremarkable. No intrahepatic or extrahepatic ductal dilatation. Pancreas: Within normal limits. Spleen: Within normal limits. Adrenals/Urinary Tract: Adrenal glands are unremarkable. Bilateral renal cortical atrophy. No renal calculi or hydronephrosis. Distended bladder. Stomach/Bowel: Stomach is unremarkable. No evidence of bowel obstruction. Again noted is right-sided colonic wall thickening with pneumatosis (series 201/images 80 and 90). However, there has been interval progression of the perihepatic fluid collection (series 201/ 42, 47, and 58), now with scattered foci of gas (series 201/ images 59, 61, and 71-72). This appearance suggests macroscopic (localized) perforation. 7.6 x 2.9 cm fluid/gas collection along the medial aspect of the inferior right hepatic lobe (series 201/ image 68). 5.2 x 8.9 cm fluid and  gas collection along the medial aspect of the caudate (series 201/ image 57). Vascular/Lymphatic: Atherosclerotic calcifications of the abdominal aorta and branch vessels. Retroperitoneal fluid/stranding. No suspicious abdominopelvic lymphadenopathy. Reproductive: Calcified uterine fibroids. Bilateral ovaries are within normal limits. Other: Upper abdominal fluid/gas, as above. Musculoskeletal: Degenerative changes of the lumbar spine, including a prominent Schmorl's node deformity in the superior endplate of L2. IMPRESSION: Again noted is right-sided colonic wall thickening with pneumatosis. Progression of associated perihepatic fluid collections/abscesses. Two new fluid/gas collections adjacent to the liver, measuring 7.6 x 2.9 cm and 5.2 x 8.9 cm, worrisome for localized macroscopic perforation. Surgical consultation is suggested. Small right and trace left pleural effusions. Electronically Signed: By: Charline Bills M.D. On: 09/06/2015 12:17   Dg Chest Port 1 View  09/05/2015  CLINICAL DATA:  Shortness of breath, end-stage renal disease, diabetes mellitus, hypertension, coronary artery disease post CABG, atrial fibrillation EXAM: PORTABLE CHEST 1 VIEW COMPARISON:  Oral exam 1546 hours compared to 08/24/2015 FINDINGS: RIGHT jugular central venous catheter with tip projecting over SVC. Enlargement of cardiac silhouette with pulmonary vascular congestion post CABG. Stable  mediastinal contours. RIGHT pleural effusion and basilar atelectasis. Minimal atelectasis LEFT base. No definite acute infiltrate or failure. No pneumothorax. IMPRESSION: Enlargement of cardiac silhouette with pulmonary vascular congestion post CABG. Bibasilar atelectasis RIGHT greater than LEFT with RIGHT pleural effusion. Electronically Signed   By: Ulyses SouthwardMark  Boles M.D.   On: 09/05/2015 16:16   Dg Chest Port 1 View  08/24/2015  CLINICAL DATA:  Follow-up pleural effusion, coronary artery disease, mitral regurgitation, chronic renal  insufficiency, dialysis dependent. EXAM: PORTABLE CHEST 1 VIEW COMPARISON:  Portable chest x-ray of August 23, 2015. FINDINGS: The lungs are reasonably well inflated. There is persistent moderate size right pleural effusion and small left pleural effusion. There is bibasilar atelectasis greater on the right. The cardiac silhouette is mildly enlarged. The pulmonary vascularity is less engorged today. The right internal jugular venous catheter tip projects over the proximal third of the SVC. There are 7 intact sternal wires. IMPRESSION: Slight interval improvement in pulmonary interstitial edema and pulmonary vascular congestion. Persistent bilateral pleural effusions and basilar atelectasis greater on the right. Electronically Signed   By: Leroy Trim  [place] M.D.   On: 08/24/2015 07:12   Dg Chest Port 1 View  08/23/2015  CLINICAL DATA:  Follow-up of pulmonary edema. EXAM: PORTABLE CHEST 1 VIEW COMPARISON:  08/22/2015 FINDINGS: Right internal jugular approach central venous catheter is unchanged, with tip projecting over the expected location of mid sub superior vena cava. Postsurgical changes are stable. Cardiomediastinal silhouette is enlarged. Mediastinal contours appear intact. There is no evidence of pneumothorax. There is persistent prominence of interstitial markings with bilateral pleural effusions, right greater than left, and bibasilar airspace consolidation. Osseous structures are without acute abnormality. Soft tissues are grossly normal. IMPRESSION: Persistent appearance of pulmonary edema with bilateral pleural effusions. Stably enlarged cardiac silhouette. Electronically Signed   By: Ted Mcalpineobrinka  Dimitrova M.D.   On: 08/23/2015 09:43   Dg Chest Port 1 View  08/22/2015  CLINICAL DATA:  Bedside central venous catheter placement. Followup pulmonary edema and possible superimposed pneumonia. EXAM: PORTABLE CHEST 1 VIEW COMPARISON:  08/21/2015 and earlier. FINDINGS: Right jugular central venous catheter tip  projects over the mid SVC. No evidence of pneumothorax or mediastinal hematoma. Prior sternotomy CABG. Cardiac silhouette markedly enlarged, unchanged. Mild diffuse interstitial pulmonary edema, unchanged. Consolidation in the lower lobes, unchanged. Bilateral pleural effusions, right greater than left, unchanged. No new pulmonary parenchymal abnormality. IMPRESSION: 1. Right jugular central venous catheter tip projects over the mid SVC. No acute complicating features. 2. Stable mild CHF and/or fluid overload, bilateral pleural effusions (right greater than left) and bilateral lower lobe atelectasis and/or pneumonia. 3. No new abnormalities. Electronically Signed   By: Hulan Saashomas  Lawrence M.D.   On: 08/22/2015 18:08   Dg Chest Portable 1 View  08/21/2015  CLINICAL DATA:  Tachycardia. EXAM: PORTABLE CHEST 1 VIEW COMPARISON:  07/22/2014 FINDINGS: Prior median sternotomy and CABG. Cardiomegaly. Mild vascular congestion. No overt edema. No effusions. No acute bony abnormality. IMPRESSION: Cardiomegaly with vascular congestion.  No overt failure. Electronically Signed   By: Charlett NoseKevin  Dover M.D.   On: 08-Feb-2015 11:48   Dg Foot 2 Views Right  08/20/2015  CLINICAL DATA:  Right foot pain for 1 day, no known injury, initial encounter EXAM: RIGHT FOOT - 2 VIEW COMPARISON:  None. FINDINGS: No acute fracture or dislocation is noted. Mild osteopenia is seen. Diffuse vascular calcifications are noted. IMPRESSION: No acute abnormality noted. Electronically Signed   By: Alcide CleverMark  Lukens M.D.   On: 08/20/2015 21:08    Microbiology:  Recent Results (from the past 240 hour(s))  C difficile quick scan w PCR reflex     Status: None   Collection Time: 09/02/15  7:54 AM  Result Value Ref Range Status   C Diff antigen NEGATIVE NEGATIVE Final   C Diff toxin NEGATIVE NEGATIVE Final   C Diff interpretation Negative for toxigenic C. difficile  Final  Culture, blood (routine x 2)     Status: None (Preliminary result)   Collection Time:  09/06/15  4:14 AM  Result Value Ref Range Status   Specimen Description BLOOD DRAWN BY IV THERAPY  Final   Special Requests IN PEDIATRIC BOTTLE 1CC  Final   Culture NO GROWTH 3 DAYS  Final   Report Status PENDING  Incomplete  Culture, blood (routine x 2)     Status: None (Preliminary result)   Collection Time: 09/06/15  4:33 AM  Result Value Ref Range Status   Specimen Description BLOOD RIGHT FINGER  Final   Special Requests BOTTLES DRAWN AEROBIC ONLY 0.5CC  Final   Culture NO GROWTH 3 DAYS  Final   Report Status PENDING  Incomplete     Labs: Basic Metabolic Panel:  Recent Labs Lab 09/03/15 0420 09/04/15 0430 09/05/15 0248 09/06/15 0232  NA 133* 135 133* 131*  K 4.3 4.2 4.7 5.3*  CL 99* 96* 96* 99*  CO2 20*  GLUCOSE 64* 127* 209* 147*  BUN 22* 11 21* 28*  CREATININE 4.50* 3.03* 4.41* 5.41*  CALCIUM 7.8* 7.7* 8.1* 7.5*   Liver Function Tests: No results for input(s): AST, ALT, ALKPHOS, BILITOT, PROT, ALBUMIN in the last 168 hours. No results for input(s): LIPASE, AMYLASE in the last 168 hours.  Recent Labs Lab 09/05/15 2110  AMMONIA 21   CBC:  Recent Labs Lab 09/03/15 0420 09/04/15 0430 09/05/15 0248 09/06/15 0232  WBC 20.5* 15.5* 16.8* 18.7*  HGB 7.8* 8.0* 7.7* 7.1*  HCT 25.1* 25.8* 24.3* 23.0*  MCV 92.6 91.8 91.4 94.7  PLT 209 204 200 152   Cardiac Enzymes:  Recent Labs Lab 09/05/15 2110  TROPONINI 0.09*   BNP: Invalid input(s): POCBNP CBG:  Recent Labs Lab 09/06/15 1127 09/06/15 1552 09/06/15 1959 09/07/15 0744 09/07/15 1233  GLUCAP 197* 199* 224* 268* 283*    Time: 1510  Signed:  Catarina Hartshorn, DO [facility] 09/30/2015, 6:24 PM

## 2015-09-15 DEATH — deceased

## 2016-07-03 IMAGING — CR DG CHEST 1V PORT
1 series · 1 of 1 positions shown · non-contrast
Comparison: 01/07/2008.

CLINICAL DATA: Bradycardia.  Hypertension.

EXAM:
PORTABLE CHEST - 1 VIEW

[AP]
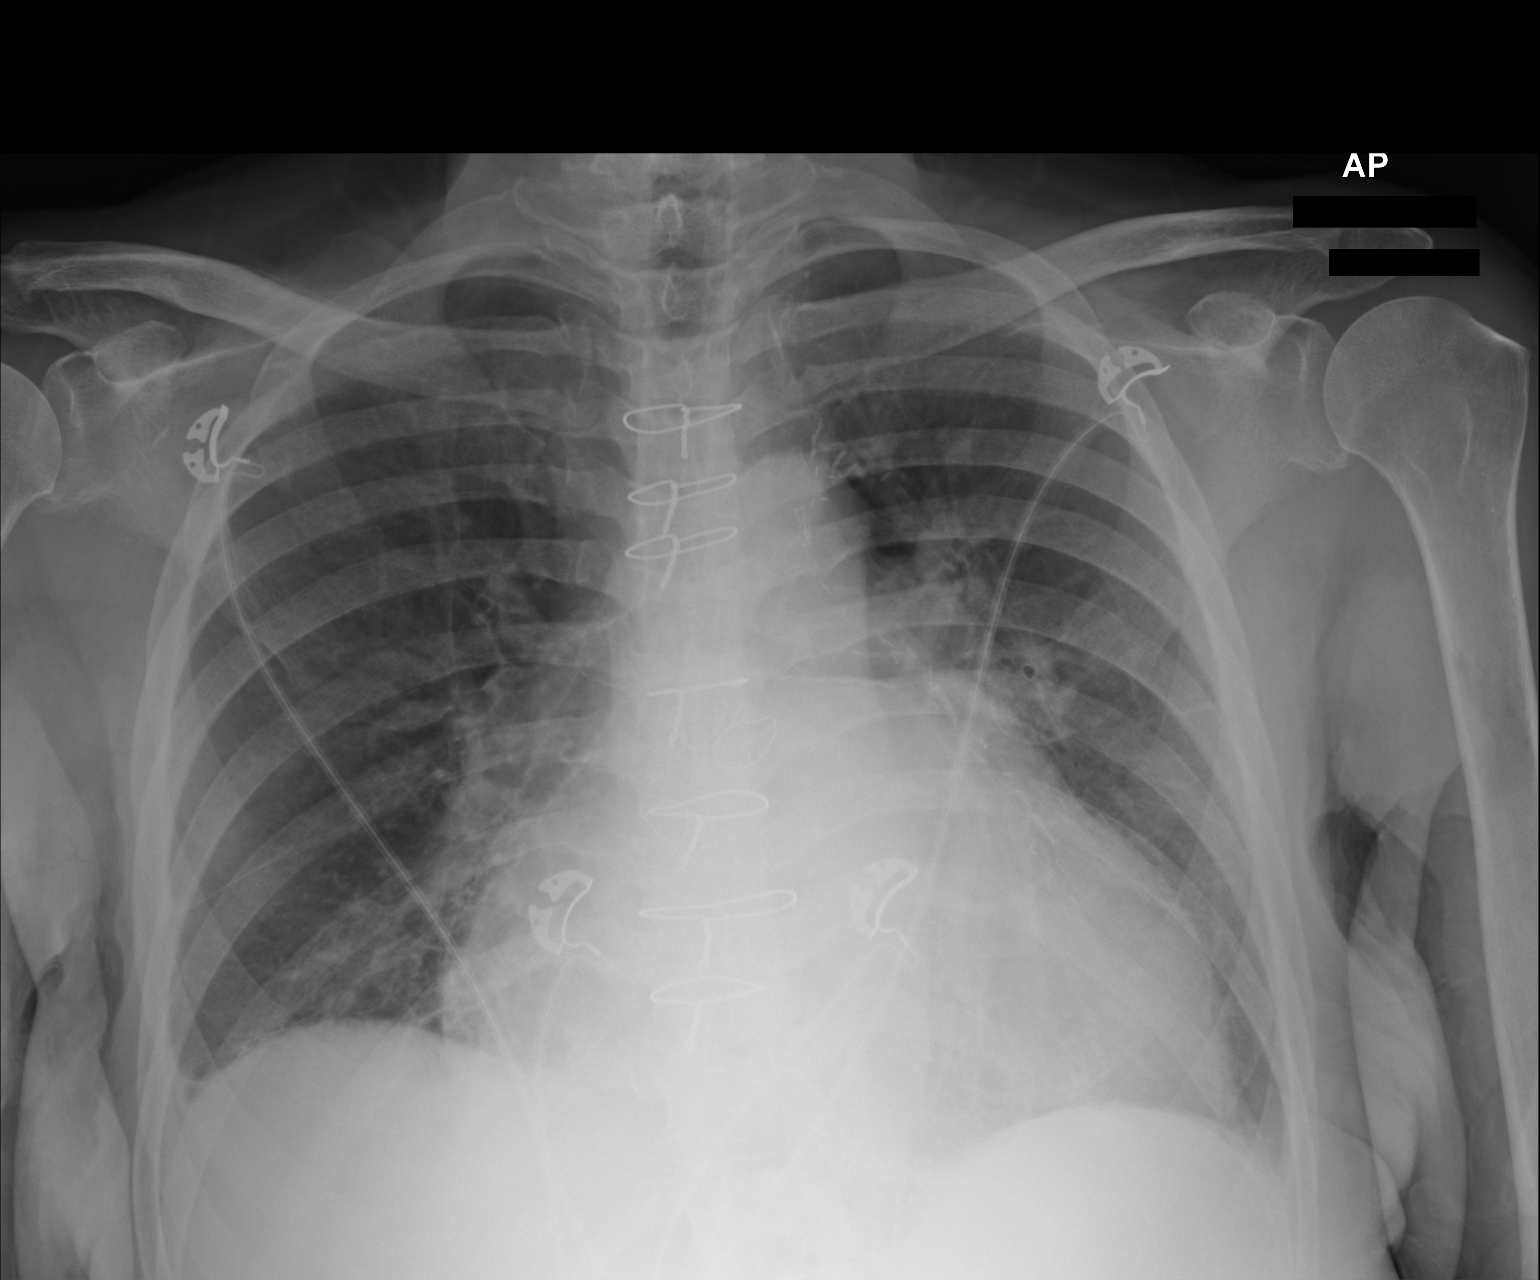

[1 of 1 positions shown; findings below may reference images not displayed]

FINDINGS: Severe cardiomegaly. Prior CABG. Mild pulmonary vascular prominence
i an nterstitial prominence suggesting mild congestive heart
failure. Small pleural effusions. No pneumothorax. No acute bony
abnormality.
IMPRESSION: 1. Mild congestive heart failure.
2. Severe cardiomegaly again noted.  Prior CABG.
